# Patient Record
Sex: Female | Born: 1995 | Race: White | Hispanic: No | Marital: Married | Smoking: Never smoker
Health system: Southern US, Community
[De-identification: ages and names within clinical notes are randomized; demographics above are authoritative.]

## PROBLEM LIST (undated history)

## (undated) DIAGNOSIS — D649 Anemia, unspecified: Secondary | ICD-10-CM

## (undated) DIAGNOSIS — I10 Essential (primary) hypertension: Secondary | ICD-10-CM

## (undated) DIAGNOSIS — G43109 Migraine with aura, not intractable, without status migrainosus: Secondary | ICD-10-CM

## (undated) DIAGNOSIS — E0590 Thyrotoxicosis, unspecified without thyrotoxic crisis or storm: Secondary | ICD-10-CM

## (undated) DIAGNOSIS — R000 Tachycardia, unspecified: Secondary | ICD-10-CM

## (undated) HISTORY — PX: NO PAST SURGERIES: SHX2092

## (undated) HISTORY — DX: Tachycardia, unspecified: R00.0

## (undated) HISTORY — DX: Essential (primary) hypertension: I10

## (undated) HISTORY — DX: Anemia, unspecified: D64.9

## (undated) HISTORY — DX: Migraine with aura, not intractable, without status migrainosus: G43.109

---
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
## 2018-02-02 DIAGNOSIS — E0590 Thyrotoxicosis, unspecified without thyrotoxic crisis or storm: Secondary | ICD-10-CM | POA: Diagnosis not present

## 2018-02-05 DIAGNOSIS — I10 Essential (primary) hypertension: Secondary | ICD-10-CM | POA: Diagnosis not present

## 2018-02-05 DIAGNOSIS — Z6835 Body mass index (BMI) 35.0-35.9, adult: Secondary | ICD-10-CM | POA: Diagnosis not present

## 2018-02-05 DIAGNOSIS — G43919 Migraine, unspecified, intractable, without status migrainosus: Secondary | ICD-10-CM | POA: Diagnosis not present

## 2018-02-05 DIAGNOSIS — E0590 Thyrotoxicosis, unspecified without thyrotoxic crisis or storm: Secondary | ICD-10-CM | POA: Diagnosis not present

## 2018-04-05 DIAGNOSIS — Z79899 Other long term (current) drug therapy: Secondary | ICD-10-CM | POA: Diagnosis not present

## 2018-04-05 DIAGNOSIS — R51 Headache: Secondary | ICD-10-CM | POA: Diagnosis not present

## 2018-04-05 DIAGNOSIS — G43909 Migraine, unspecified, not intractable, without status migrainosus: Secondary | ICD-10-CM | POA: Diagnosis not present

## 2018-04-07 DIAGNOSIS — E0590 Thyrotoxicosis, unspecified without thyrotoxic crisis or storm: Secondary | ICD-10-CM | POA: Diagnosis not present

## 2018-06-17 DIAGNOSIS — E0590 Thyrotoxicosis, unspecified without thyrotoxic crisis or storm: Secondary | ICD-10-CM | POA: Diagnosis not present

## 2018-07-20 DIAGNOSIS — E669 Obesity, unspecified: Secondary | ICD-10-CM | POA: Diagnosis not present

## 2018-07-20 DIAGNOSIS — Z6835 Body mass index (BMI) 35.0-35.9, adult: Secondary | ICD-10-CM | POA: Diagnosis not present

## 2018-07-20 DIAGNOSIS — H6641 Suppurative otitis media, unspecified, right ear: Secondary | ICD-10-CM | POA: Diagnosis not present

## 2018-08-03 DIAGNOSIS — J0100 Acute maxillary sinusitis, unspecified: Secondary | ICD-10-CM | POA: Diagnosis not present

## 2018-08-03 DIAGNOSIS — Z6834 Body mass index (BMI) 34.0-34.9, adult: Secondary | ICD-10-CM | POA: Diagnosis not present

## 2018-08-20 DIAGNOSIS — G43919 Migraine, unspecified, intractable, without status migrainosus: Secondary | ICD-10-CM | POA: Diagnosis not present

## 2018-08-20 DIAGNOSIS — E0590 Thyrotoxicosis, unspecified without thyrotoxic crisis or storm: Secondary | ICD-10-CM | POA: Diagnosis not present

## 2018-08-20 DIAGNOSIS — Z6834 Body mass index (BMI) 34.0-34.9, adult: Secondary | ICD-10-CM | POA: Diagnosis not present

## 2018-11-30 DIAGNOSIS — F339 Major depressive disorder, recurrent, unspecified: Secondary | ICD-10-CM | POA: Diagnosis not present

## 2018-11-30 DIAGNOSIS — M542 Cervicalgia: Secondary | ICD-10-CM | POA: Diagnosis not present

## 2019-02-01 DIAGNOSIS — Z20828 Contact with and (suspected) exposure to other viral communicable diseases: Secondary | ICD-10-CM | POA: Diagnosis not present

## 2019-02-09 DIAGNOSIS — M79641 Pain in right hand: Secondary | ICD-10-CM | POA: Diagnosis not present

## 2019-02-09 DIAGNOSIS — M79643 Pain in unspecified hand: Secondary | ICD-10-CM | POA: Diagnosis not present

## 2019-02-09 DIAGNOSIS — S6991XA Unspecified injury of right wrist, hand and finger(s), initial encounter: Secondary | ICD-10-CM | POA: Diagnosis not present

## 2019-02-09 DIAGNOSIS — M25539 Pain in unspecified wrist: Secondary | ICD-10-CM | POA: Diagnosis not present

## 2019-02-09 DIAGNOSIS — M25531 Pain in right wrist: Secondary | ICD-10-CM | POA: Diagnosis not present

## 2019-02-10 DIAGNOSIS — X58XXXA Exposure to other specified factors, initial encounter: Secondary | ICD-10-CM | POA: Diagnosis not present

## 2019-02-10 DIAGNOSIS — S6991XA Unspecified injury of right wrist, hand and finger(s), initial encounter: Secondary | ICD-10-CM | POA: Diagnosis not present

## 2019-02-10 DIAGNOSIS — M25531 Pain in right wrist: Secondary | ICD-10-CM | POA: Diagnosis not present

## 2019-03-04 DIAGNOSIS — R5383 Other fatigue: Secondary | ICD-10-CM | POA: Diagnosis not present

## 2019-03-04 DIAGNOSIS — F5101 Primary insomnia: Secondary | ICD-10-CM | POA: Diagnosis not present

## 2019-03-04 DIAGNOSIS — E0590 Thyrotoxicosis, unspecified without thyrotoxic crisis or storm: Secondary | ICD-10-CM | POA: Diagnosis not present

## 2019-04-13 DIAGNOSIS — Z6834 Body mass index (BMI) 34.0-34.9, adult: Secondary | ICD-10-CM | POA: Diagnosis not present

## 2019-04-13 DIAGNOSIS — E0590 Thyrotoxicosis, unspecified without thyrotoxic crisis or storm: Secondary | ICD-10-CM | POA: Diagnosis not present

## 2019-04-13 DIAGNOSIS — G43919 Migraine, unspecified, intractable, without status migrainosus: Secondary | ICD-10-CM | POA: Diagnosis not present

## 2019-04-28 DIAGNOSIS — Z23 Encounter for immunization: Secondary | ICD-10-CM | POA: Diagnosis not present

## 2019-05-12 DIAGNOSIS — Z20828 Contact with and (suspected) exposure to other viral communicable diseases: Secondary | ICD-10-CM | POA: Diagnosis not present

## 2019-07-06 DIAGNOSIS — Z20828 Contact with and (suspected) exposure to other viral communicable diseases: Secondary | ICD-10-CM | POA: Diagnosis not present

## 2019-07-19 DIAGNOSIS — Z20828 Contact with and (suspected) exposure to other viral communicable diseases: Secondary | ICD-10-CM | POA: Diagnosis not present

## 2019-08-11 DIAGNOSIS — Z20822 Contact with and (suspected) exposure to covid-19: Secondary | ICD-10-CM | POA: Diagnosis not present

## 2019-09-20 DIAGNOSIS — G43919 Migraine, unspecified, intractable, without status migrainosus: Secondary | ICD-10-CM | POA: Diagnosis not present

## 2019-11-01 DIAGNOSIS — E860 Dehydration: Secondary | ICD-10-CM | POA: Diagnosis not present

## 2019-11-01 DIAGNOSIS — B373 Candidiasis of vulva and vagina: Secondary | ICD-10-CM | POA: Diagnosis not present

## 2019-11-01 DIAGNOSIS — N390 Urinary tract infection, site not specified: Secondary | ICD-10-CM | POA: Diagnosis not present

## 2019-11-02 DIAGNOSIS — R339 Retention of urine, unspecified: Secondary | ICD-10-CM | POA: Diagnosis not present

## 2019-11-02 DIAGNOSIS — E860 Dehydration: Secondary | ICD-10-CM | POA: Diagnosis not present

## 2019-11-02 DIAGNOSIS — N9089 Other specified noninflammatory disorders of vulva and perineum: Secondary | ICD-10-CM | POA: Diagnosis not present

## 2019-11-02 DIAGNOSIS — N390 Urinary tract infection, site not specified: Secondary | ICD-10-CM | POA: Diagnosis not present

## 2019-11-04 DIAGNOSIS — N390 Urinary tract infection, site not specified: Secondary | ICD-10-CM | POA: Diagnosis not present

## 2019-11-23 DIAGNOSIS — J309 Allergic rhinitis, unspecified: Secondary | ICD-10-CM | POA: Diagnosis not present

## 2019-11-24 DIAGNOSIS — J309 Allergic rhinitis, unspecified: Secondary | ICD-10-CM | POA: Diagnosis not present

## 2019-11-25 DIAGNOSIS — J309 Allergic rhinitis, unspecified: Secondary | ICD-10-CM | POA: Diagnosis not present

## 2019-11-26 DIAGNOSIS — J309 Allergic rhinitis, unspecified: Secondary | ICD-10-CM | POA: Diagnosis not present

## 2019-11-29 DIAGNOSIS — J309 Allergic rhinitis, unspecified: Secondary | ICD-10-CM | POA: Diagnosis not present

## 2019-11-30 DIAGNOSIS — J309 Allergic rhinitis, unspecified: Secondary | ICD-10-CM | POA: Diagnosis not present

## 2019-12-01 DIAGNOSIS — J309 Allergic rhinitis, unspecified: Secondary | ICD-10-CM | POA: Diagnosis not present

## 2019-12-02 DIAGNOSIS — J309 Allergic rhinitis, unspecified: Secondary | ICD-10-CM | POA: Diagnosis not present

## 2019-12-10 DIAGNOSIS — Z124 Encounter for screening for malignant neoplasm of cervix: Secondary | ICD-10-CM | POA: Diagnosis not present

## 2019-12-10 DIAGNOSIS — Z01419 Encounter for gynecological examination (general) (routine) without abnormal findings: Secondary | ICD-10-CM | POA: Diagnosis not present

## 2019-12-10 DIAGNOSIS — Z113 Encounter for screening for infections with a predominantly sexual mode of transmission: Secondary | ICD-10-CM | POA: Diagnosis not present

## 2019-12-10 DIAGNOSIS — Z6836 Body mass index (BMI) 36.0-36.9, adult: Secondary | ICD-10-CM | POA: Diagnosis not present

## 2019-12-10 LAB — CYTOLOGY - PAP: Pap: NEGATIVE

## 2020-02-22 DIAGNOSIS — Z9109 Other allergy status, other than to drugs and biological substances: Secondary | ICD-10-CM | POA: Diagnosis not present

## 2020-02-25 DIAGNOSIS — Z9109 Other allergy status, other than to drugs and biological substances: Secondary | ICD-10-CM | POA: Diagnosis not present

## 2020-02-29 DIAGNOSIS — Z9109 Other allergy status, other than to drugs and biological substances: Secondary | ICD-10-CM | POA: Diagnosis not present

## 2020-03-03 DIAGNOSIS — Z9109 Other allergy status, other than to drugs and biological substances: Secondary | ICD-10-CM | POA: Diagnosis not present

## 2020-03-07 DIAGNOSIS — Z9109 Other allergy status, other than to drugs and biological substances: Secondary | ICD-10-CM | POA: Diagnosis not present

## 2020-03-10 DIAGNOSIS — Z9109 Other allergy status, other than to drugs and biological substances: Secondary | ICD-10-CM | POA: Diagnosis not present

## 2020-03-14 DIAGNOSIS — Z9109 Other allergy status, other than to drugs and biological substances: Secondary | ICD-10-CM | POA: Diagnosis not present

## 2020-03-17 DIAGNOSIS — Z9109 Other allergy status, other than to drugs and biological substances: Secondary | ICD-10-CM | POA: Diagnosis not present

## 2020-03-22 DIAGNOSIS — Z9109 Other allergy status, other than to drugs and biological substances: Secondary | ICD-10-CM | POA: Diagnosis not present

## 2020-03-25 DIAGNOSIS — S50311A Abrasion of right elbow, initial encounter: Secondary | ICD-10-CM | POA: Diagnosis not present

## 2020-03-25 DIAGNOSIS — Y92410 Unspecified street and highway as the place of occurrence of the external cause: Secondary | ICD-10-CM | POA: Diagnosis not present

## 2020-03-25 DIAGNOSIS — R000 Tachycardia, unspecified: Secondary | ICD-10-CM | POA: Diagnosis not present

## 2020-03-25 DIAGNOSIS — S70351A Superficial foreign body, right thigh, initial encounter: Secondary | ICD-10-CM | POA: Diagnosis not present

## 2020-03-25 DIAGNOSIS — R0789 Other chest pain: Secondary | ICD-10-CM | POA: Diagnosis not present

## 2020-03-25 DIAGNOSIS — Z23 Encounter for immunization: Secondary | ICD-10-CM | POA: Diagnosis not present

## 2020-03-25 DIAGNOSIS — R58 Hemorrhage, not elsewhere classified: Secondary | ICD-10-CM | POA: Diagnosis not present

## 2020-03-25 DIAGNOSIS — S80211A Abrasion, right knee, initial encounter: Secondary | ICD-10-CM | POA: Diagnosis not present

## 2020-03-25 DIAGNOSIS — R52 Pain, unspecified: Secondary | ICD-10-CM | POA: Diagnosis not present

## 2020-03-28 DIAGNOSIS — Z6836 Body mass index (BMI) 36.0-36.9, adult: Secondary | ICD-10-CM | POA: Diagnosis not present

## 2020-03-28 DIAGNOSIS — M542 Cervicalgia: Secondary | ICD-10-CM | POA: Diagnosis not present

## 2020-04-07 DIAGNOSIS — Z9109 Other allergy status, other than to drugs and biological substances: Secondary | ICD-10-CM | POA: Diagnosis not present

## 2020-04-11 DIAGNOSIS — Z9109 Other allergy status, other than to drugs and biological substances: Secondary | ICD-10-CM | POA: Diagnosis not present

## 2020-04-12 DIAGNOSIS — Z23 Encounter for immunization: Secondary | ICD-10-CM | POA: Diagnosis not present

## 2020-04-18 DIAGNOSIS — Z9109 Other allergy status, other than to drugs and biological substances: Secondary | ICD-10-CM | POA: Diagnosis not present

## 2020-04-25 DIAGNOSIS — Z9109 Other allergy status, other than to drugs and biological substances: Secondary | ICD-10-CM | POA: Diagnosis not present

## 2020-05-02 DIAGNOSIS — Z9109 Other allergy status, other than to drugs and biological substances: Secondary | ICD-10-CM | POA: Diagnosis not present

## 2020-05-09 DIAGNOSIS — Z9109 Other allergy status, other than to drugs and biological substances: Secondary | ICD-10-CM | POA: Diagnosis not present

## 2020-05-11 DIAGNOSIS — G43919 Migraine, unspecified, intractable, without status migrainosus: Secondary | ICD-10-CM | POA: Diagnosis not present

## 2020-05-11 DIAGNOSIS — E0590 Thyrotoxicosis, unspecified without thyrotoxic crisis or storm: Secondary | ICD-10-CM | POA: Diagnosis not present

## 2020-05-11 DIAGNOSIS — F339 Major depressive disorder, recurrent, unspecified: Secondary | ICD-10-CM | POA: Diagnosis not present

## 2020-05-16 DIAGNOSIS — E0590 Thyrotoxicosis, unspecified without thyrotoxic crisis or storm: Secondary | ICD-10-CM | POA: Diagnosis not present

## 2020-05-16 DIAGNOSIS — Z9109 Other allergy status, other than to drugs and biological substances: Secondary | ICD-10-CM | POA: Diagnosis not present

## 2020-05-23 DIAGNOSIS — Z9109 Other allergy status, other than to drugs and biological substances: Secondary | ICD-10-CM | POA: Diagnosis not present

## 2020-05-30 DIAGNOSIS — Z9109 Other allergy status, other than to drugs and biological substances: Secondary | ICD-10-CM | POA: Diagnosis not present

## 2020-06-06 DIAGNOSIS — Z9109 Other allergy status, other than to drugs and biological substances: Secondary | ICD-10-CM | POA: Diagnosis not present

## 2020-06-07 DIAGNOSIS — Z23 Encounter for immunization: Secondary | ICD-10-CM | POA: Diagnosis not present

## 2020-06-27 DIAGNOSIS — Z9109 Other allergy status, other than to drugs and biological substances: Secondary | ICD-10-CM | POA: Diagnosis not present

## 2020-06-29 DIAGNOSIS — E0590 Thyrotoxicosis, unspecified without thyrotoxic crisis or storm: Secondary | ICD-10-CM | POA: Diagnosis not present

## 2020-07-04 DIAGNOSIS — Z9109 Other allergy status, other than to drugs and biological substances: Secondary | ICD-10-CM | POA: Diagnosis not present

## 2020-07-11 DIAGNOSIS — Z9109 Other allergy status, other than to drugs and biological substances: Secondary | ICD-10-CM | POA: Diagnosis not present

## 2020-07-18 DIAGNOSIS — Z9109 Other allergy status, other than to drugs and biological substances: Secondary | ICD-10-CM | POA: Diagnosis not present

## 2020-08-15 DIAGNOSIS — Z9109 Other allergy status, other than to drugs and biological substances: Secondary | ICD-10-CM | POA: Diagnosis not present

## 2020-08-22 DIAGNOSIS — Z9109 Other allergy status, other than to drugs and biological substances: Secondary | ICD-10-CM | POA: Diagnosis not present

## 2020-08-23 DIAGNOSIS — J019 Acute sinusitis, unspecified: Secondary | ICD-10-CM | POA: Diagnosis not present

## 2020-08-29 DIAGNOSIS — J329 Chronic sinusitis, unspecified: Secondary | ICD-10-CM | POA: Diagnosis not present

## 2020-08-29 DIAGNOSIS — Z6836 Body mass index (BMI) 36.0-36.9, adult: Secondary | ICD-10-CM | POA: Diagnosis not present

## 2020-08-29 DIAGNOSIS — Z9109 Other allergy status, other than to drugs and biological substances: Secondary | ICD-10-CM | POA: Diagnosis not present

## 2020-09-12 DIAGNOSIS — Z9109 Other allergy status, other than to drugs and biological substances: Secondary | ICD-10-CM | POA: Diagnosis not present

## 2020-09-20 DIAGNOSIS — Z9109 Other allergy status, other than to drugs and biological substances: Secondary | ICD-10-CM | POA: Diagnosis not present

## 2020-09-22 DIAGNOSIS — E0590 Thyrotoxicosis, unspecified without thyrotoxic crisis or storm: Secondary | ICD-10-CM | POA: Diagnosis not present

## 2020-10-05 DIAGNOSIS — Z9109 Other allergy status, other than to drugs and biological substances: Secondary | ICD-10-CM | POA: Diagnosis not present

## 2020-10-19 DIAGNOSIS — Z9109 Other allergy status, other than to drugs and biological substances: Secondary | ICD-10-CM | POA: Diagnosis not present

## 2020-10-26 DIAGNOSIS — Z9109 Other allergy status, other than to drugs and biological substances: Secondary | ICD-10-CM | POA: Diagnosis not present

## 2020-11-02 DIAGNOSIS — Z9109 Other allergy status, other than to drugs and biological substances: Secondary | ICD-10-CM | POA: Diagnosis not present

## 2020-11-09 DIAGNOSIS — E0590 Thyrotoxicosis, unspecified without thyrotoxic crisis or storm: Secondary | ICD-10-CM | POA: Diagnosis not present

## 2020-11-09 DIAGNOSIS — Z9109 Other allergy status, other than to drugs and biological substances: Secondary | ICD-10-CM | POA: Diagnosis not present

## 2020-11-16 DIAGNOSIS — Z9109 Other allergy status, other than to drugs and biological substances: Secondary | ICD-10-CM | POA: Diagnosis not present

## 2020-12-12 DIAGNOSIS — Z9109 Other allergy status, other than to drugs and biological substances: Secondary | ICD-10-CM | POA: Diagnosis not present

## 2020-12-14 DIAGNOSIS — Z20822 Contact with and (suspected) exposure to covid-19: Secondary | ICD-10-CM | POA: Diagnosis not present

## 2020-12-14 DIAGNOSIS — Z1152 Encounter for screening for COVID-19: Secondary | ICD-10-CM | POA: Diagnosis not present

## 2020-12-14 DIAGNOSIS — J029 Acute pharyngitis, unspecified: Secondary | ICD-10-CM | POA: Diagnosis not present

## 2020-12-25 DIAGNOSIS — E0500 Thyrotoxicosis with diffuse goiter without thyrotoxic crisis or storm: Secondary | ICD-10-CM | POA: Diagnosis not present

## 2020-12-25 DIAGNOSIS — G43809 Other migraine, not intractable, without status migrainosus: Secondary | ICD-10-CM | POA: Diagnosis not present

## 2020-12-25 DIAGNOSIS — E0590 Thyrotoxicosis, unspecified without thyrotoxic crisis or storm: Secondary | ICD-10-CM | POA: Diagnosis not present

## 2020-12-25 DIAGNOSIS — F419 Anxiety disorder, unspecified: Secondary | ICD-10-CM | POA: Diagnosis not present

## 2020-12-26 DIAGNOSIS — Z9109 Other allergy status, other than to drugs and biological substances: Secondary | ICD-10-CM | POA: Diagnosis not present

## 2021-01-01 DIAGNOSIS — Z20822 Contact with and (suspected) exposure to covid-19: Secondary | ICD-10-CM | POA: Diagnosis not present

## 2021-01-01 DIAGNOSIS — E0590 Thyrotoxicosis, unspecified without thyrotoxic crisis or storm: Secondary | ICD-10-CM | POA: Diagnosis not present

## 2021-01-01 DIAGNOSIS — R0602 Shortness of breath: Secondary | ICD-10-CM | POA: Diagnosis not present

## 2021-01-01 DIAGNOSIS — N920 Excessive and frequent menstruation with regular cycle: Secondary | ICD-10-CM | POA: Diagnosis not present

## 2021-01-02 DIAGNOSIS — Z9109 Other allergy status, other than to drugs and biological substances: Secondary | ICD-10-CM | POA: Diagnosis not present

## 2021-01-02 DIAGNOSIS — N920 Excessive and frequent menstruation with regular cycle: Secondary | ICD-10-CM | POA: Diagnosis not present

## 2021-01-09 DIAGNOSIS — Z9109 Other allergy status, other than to drugs and biological substances: Secondary | ICD-10-CM | POA: Diagnosis not present

## 2021-02-01 DIAGNOSIS — Z9109 Other allergy status, other than to drugs and biological substances: Secondary | ICD-10-CM | POA: Diagnosis not present

## 2021-02-21 DIAGNOSIS — R059 Cough, unspecified: Secondary | ICD-10-CM | POA: Diagnosis not present

## 2021-02-21 DIAGNOSIS — Z20822 Contact with and (suspected) exposure to covid-19: Secondary | ICD-10-CM | POA: Diagnosis not present

## 2021-02-21 DIAGNOSIS — Z1152 Encounter for screening for COVID-19: Secondary | ICD-10-CM | POA: Diagnosis not present

## 2021-02-23 DIAGNOSIS — Z20822 Contact with and (suspected) exposure to covid-19: Secondary | ICD-10-CM | POA: Diagnosis not present

## 2021-02-23 DIAGNOSIS — J029 Acute pharyngitis, unspecified: Secondary | ICD-10-CM | POA: Diagnosis not present

## 2021-03-29 DIAGNOSIS — N390 Urinary tract infection, site not specified: Secondary | ICD-10-CM | POA: Diagnosis not present

## 2021-03-29 DIAGNOSIS — Z6836 Body mass index (BMI) 36.0-36.9, adult: Secondary | ICD-10-CM | POA: Diagnosis not present

## 2021-03-29 DIAGNOSIS — R300 Dysuria: Secondary | ICD-10-CM | POA: Diagnosis not present

## 2021-03-29 DIAGNOSIS — R82998 Other abnormal findings in urine: Secondary | ICD-10-CM | POA: Diagnosis not present

## 2021-04-04 DIAGNOSIS — Z23 Encounter for immunization: Secondary | ICD-10-CM | POA: Diagnosis not present

## 2021-04-25 DIAGNOSIS — Z23 Encounter for immunization: Secondary | ICD-10-CM | POA: Diagnosis not present

## 2021-06-05 DIAGNOSIS — Z20822 Contact with and (suspected) exposure to covid-19: Secondary | ICD-10-CM | POA: Diagnosis not present

## 2021-06-05 DIAGNOSIS — R509 Fever, unspecified: Secondary | ICD-10-CM | POA: Diagnosis not present

## 2021-06-05 DIAGNOSIS — Z6836 Body mass index (BMI) 36.0-36.9, adult: Secondary | ICD-10-CM | POA: Diagnosis not present

## 2021-06-05 DIAGNOSIS — J069 Acute upper respiratory infection, unspecified: Secondary | ICD-10-CM | POA: Diagnosis not present

## 2021-06-29 DIAGNOSIS — O219 Vomiting of pregnancy, unspecified: Secondary | ICD-10-CM | POA: Diagnosis not present

## 2021-06-29 DIAGNOSIS — O468X1 Other antepartum hemorrhage, first trimester: Secondary | ICD-10-CM | POA: Diagnosis not present

## 2021-06-29 DIAGNOSIS — R1110 Vomiting, unspecified: Secondary | ICD-10-CM | POA: Diagnosis not present

## 2021-06-29 DIAGNOSIS — Z3A01 Less than 8 weeks gestation of pregnancy: Secondary | ICD-10-CM | POA: Diagnosis not present

## 2021-06-29 DIAGNOSIS — O210 Mild hyperemesis gravidarum: Secondary | ICD-10-CM | POA: Diagnosis not present

## 2021-06-29 DIAGNOSIS — O3481 Maternal care for other abnormalities of pelvic organs, first trimester: Secondary | ICD-10-CM | POA: Diagnosis not present

## 2021-06-29 DIAGNOSIS — Z3A08 8 weeks gestation of pregnancy: Secondary | ICD-10-CM | POA: Diagnosis not present

## 2021-06-30 DIAGNOSIS — O3481 Maternal care for other abnormalities of pelvic organs, first trimester: Secondary | ICD-10-CM | POA: Diagnosis not present

## 2021-06-30 DIAGNOSIS — Z3A01 Less than 8 weeks gestation of pregnancy: Secondary | ICD-10-CM | POA: Diagnosis not present

## 2021-06-30 DIAGNOSIS — O468X1 Other antepartum hemorrhage, first trimester: Secondary | ICD-10-CM | POA: Diagnosis not present

## 2021-07-05 DIAGNOSIS — J029 Acute pharyngitis, unspecified: Secondary | ICD-10-CM | POA: Diagnosis not present

## 2021-07-05 DIAGNOSIS — Z20822 Contact with and (suspected) exposure to covid-19: Secondary | ICD-10-CM | POA: Diagnosis not present

## 2021-07-05 DIAGNOSIS — R0982 Postnasal drip: Secondary | ICD-10-CM | POA: Diagnosis not present

## 2021-07-05 DIAGNOSIS — Z1152 Encounter for screening for COVID-19: Secondary | ICD-10-CM | POA: Diagnosis not present

## 2021-07-06 DIAGNOSIS — R000 Tachycardia, unspecified: Secondary | ICD-10-CM | POA: Diagnosis not present

## 2021-07-06 DIAGNOSIS — O99281 Endocrine, nutritional and metabolic diseases complicating pregnancy, first trimester: Secondary | ICD-10-CM | POA: Diagnosis not present

## 2021-07-06 DIAGNOSIS — E0590 Thyrotoxicosis, unspecified without thyrotoxic crisis or storm: Secondary | ICD-10-CM | POA: Diagnosis not present

## 2021-07-08 DIAGNOSIS — O210 Mild hyperemesis gravidarum: Secondary | ICD-10-CM | POA: Diagnosis not present

## 2021-07-08 DIAGNOSIS — Z3A09 9 weeks gestation of pregnancy: Secondary | ICD-10-CM | POA: Diagnosis not present

## 2021-07-09 DIAGNOSIS — R000 Tachycardia, unspecified: Secondary | ICD-10-CM | POA: Diagnosis not present

## 2021-07-11 DIAGNOSIS — N925 Other specified irregular menstruation: Secondary | ICD-10-CM | POA: Diagnosis not present

## 2021-07-11 DIAGNOSIS — Z6833 Body mass index (BMI) 33.0-33.9, adult: Secondary | ICD-10-CM | POA: Diagnosis not present

## 2021-07-11 DIAGNOSIS — Z113 Encounter for screening for infections with a predominantly sexual mode of transmission: Secondary | ICD-10-CM | POA: Diagnosis not present

## 2021-07-11 DIAGNOSIS — E876 Hypokalemia: Secondary | ICD-10-CM | POA: Diagnosis not present

## 2021-07-11 DIAGNOSIS — Z3480 Encounter for supervision of other normal pregnancy, unspecified trimester: Secondary | ICD-10-CM | POA: Diagnosis not present

## 2021-07-20 ENCOUNTER — Inpatient Hospital Stay (HOSPITAL_COMMUNITY)
Admission: AD | Admit: 2021-07-20 | Discharge: 2021-07-20 | Disposition: A | Discharge status: Home / Self Care | Payer: BC Managed Care – PPO | Attending: Obstetrics and Gynecology | Admitting: Obstetrics and Gynecology

## 2021-07-20 ENCOUNTER — Other Ambulatory Visit: Payer: Self-pay

## 2021-07-20 ENCOUNTER — Encounter (HOSPITAL_COMMUNITY): Payer: Self-pay | Admitting: Obstetrics and Gynecology

## 2021-07-20 DIAGNOSIS — R1110 Vomiting, unspecified: Secondary | ICD-10-CM | POA: Diagnosis not present

## 2021-07-20 DIAGNOSIS — Z3A11 11 weeks gestation of pregnancy: Secondary | ICD-10-CM | POA: Diagnosis not present

## 2021-07-20 DIAGNOSIS — O219 Vomiting of pregnancy, unspecified: Secondary | ICD-10-CM | POA: Diagnosis not present

## 2021-07-20 HISTORY — DX: Thyrotoxicosis, unspecified without thyrotoxic crisis or storm: E05.90

## 2021-07-20 LAB — COMPREHENSIVE METABOLIC PANEL
ALT: 42 U/L (ref 0–44)
AST: 25 U/L (ref 15–41)
Albumin: 3.6 g/dL (ref 3.5–5.0)
Alkaline Phosphatase: 72 U/L (ref 38–126)
Anion gap: 12 (ref 5–15)
BUN: 6 mg/dL (ref 6–20)
CO2: 20 mmol/L — ABNORMAL LOW (ref 22–32)
Calcium: 9.3 mg/dL (ref 8.9–10.3)
Chloride: 104 mmol/L (ref 98–111)
Creatinine, Ser: 0.44 mg/dL (ref 0.44–1.00)
GFR, Estimated: >60 mL/min (ref >60)
Glucose, Bld: 97 mg/dL (ref 70–99)
Potassium: 3.8 mmol/L (ref 3.5–5.1)
Sodium: 136 mmol/L (ref 135–145)
Total Bilirubin: 0.7 mg/dL (ref 0.3–1.2)
Total Protein: 6.7 g/dL (ref 6.5–8.1)

## 2021-07-20 LAB — CBC
HCT: 38.3 % (ref 36.0–46.0)
Hemoglobin: 13.1 g/dL (ref 12.0–15.0)
MCH: 27.8 pg (ref 26.0–34.0)
MCHC: 34.2 g/dL (ref 30.0–36.0)
MCV: 81.1 fL (ref 80.0–100.0)
Platelets: 189 10*3/uL (ref 150–400)
RBC: 4.72 MIL/uL (ref 3.87–5.11)
RDW: 12 % (ref 11.5–15.5)
WBC: 5.5 10*3/uL (ref 4.0–10.5)
nRBC: 0 % (ref 0.0–0.2)

## 2021-07-20 LAB — URINALYSIS, ROUTINE W REFLEX MICROSCOPIC
Bacteria, UA: NONE SEEN
Bilirubin Urine: NEGATIVE
Glucose, UA: NEGATIVE mg/dL
Hgb urine dipstick: NEGATIVE
Ketones, ur: NEGATIVE mg/dL
Leukocytes,Ua: NEGATIVE
Nitrite: NEGATIVE
Protein, ur: 30 mg/dL — AB
Specific Gravity, Urine: 1.025 (ref 1.005–1.030)
pH: 8 (ref 5.0–8.0)

## 2021-07-20 MED ORDER — SCOPOLAMINE 1 MG/3DAYS TD PT72
1.0000 | MEDICATED_PATCH | TRANSDERMAL | Status: DC
  Administered 2021-07-20: 16:00:00 1.5 mg via TRANSDERMAL
  Filled 2021-07-20: qty 1

## 2021-07-20 MED ORDER — SCOPOLAMINE 1 MG/3DAYS TD PT72: 1.0000 | MEDICATED_PATCH | TRANSDERMAL | 12 refills | Status: DC

## 2021-07-20 MED ORDER — FAMOTIDINE 20 MG PO TABS: 20.0000 mg | ORAL_TABLET | Freq: Two times a day (BID) | ORAL | 0 refills | Status: DC

## 2021-07-20 MED ORDER — SODIUM CHLORIDE 0.9 % IV SOLN
25.0000 mg | Freq: Once | INTRAVENOUS | Status: AC
  Administered 2021-07-20: 16:00:00 25 mg via INTRAVENOUS
  Filled 2021-07-20: qty 1

## 2021-07-20 MED ORDER — FAMOTIDINE IN NACL 20-0.9 MG/50ML-% IV SOLN
20.0000 mg | Freq: Once | INTRAVENOUS | Status: AC
  Administered 2021-07-20: 16:00:00 20 mg via INTRAVENOUS
  Filled 2021-07-20: qty 50

## 2021-07-20 MED ORDER — PROMETHAZINE HCL 25 MG RE SUPP: 25.0000 mg | Freq: Four times a day (QID) | RECTAL | 0 refills | Status: DC | PRN

## 2021-07-20 MED ORDER — DEXTROSE IN LACTATED RINGERS 5 % IV SOLN
Freq: Once | INTRAVENOUS | Status: AC
  Filled 2021-07-20: qty 10

## 2021-07-20 NOTE — MAU Provider Note (Signed)
History     CSN: [id number]  Arrival date and time: 07/20/21 1419  None    Chief Complaint  Patient presents with   Emesis   Nausea   HPI April Benson is a 25 y.o. G1P0 at [redacted]w[redacted]d by early ultrasound who presents to MAU for nausea and vomiting. Patient reports ongoing n/v since 06/22/2021. She has been taking Zofran ODT, last took at 9am, which helps for about an hour before she starts vomiting again. She also has a prescription for Phenergan, however has not tried to take it recently because every time she takes it, it immediately comes back up. She reports she has vomited 7x in the past 24 hours. She reports that the only thing that she is able to tolerate is pink lemonade. Tried to eat a baked potato last night, but immediately vomited afterwards. She denies pain, bleeding, discharge, or fever. Receives [facility] at [facility] Has appointment on 07/26/2021.   OB History     Gravida  1   Para      Term      Preterm      AB      Living         SAB      IAB      Ectopic      Multiple      Live Births              Past Medical History:  Diagnosis Date   Hyperthyroidism     Past Surgical History:  Procedure Laterality Date   NO PAST SURGERIES      History reviewed. No pertinent family history.  Social History   Tobacco Use   Smoking status: Never   Smokeless tobacco: Never  Vaping Use   Vaping Use: Never used  Substance Use Topics   Alcohol use: Not Currently   Drug use: Never    Allergies: No Known Allergies  No medications prior to admission.    Review of Systems  Constitutional: Negative.   Respiratory: Negative.    Cardiovascular: Negative.   Gastrointestinal:  Positive for nausea and vomiting. Negative for abdominal pain.  Genitourinary: Negative.   Musculoskeletal: Negative.   Neurological: Negative.    Physical Exam   Blood pressure 129/83, pulse (!) 104, temperature 98.3 F (36.8 C), temperature source Oral, resp. rate 19,  height 5\' 3"  (1.6 m), weight 82.7 kg, SpO2 100 %.  Physical Exam Vitals and nursing note reviewed.  Constitutional:      General: She is not in acute distress.    Appearance: She is overweight. She is not ill-appearing.  Eyes:     Extraocular Movements: Extraocular movements intact.     Pupils: Pupils are equal, round, and reactive to light.  Cardiovascular:     Rate and Rhythm: Tachycardia present.  Pulmonary:     Effort: Pulmonary effort is normal.  Abdominal:     Palpations: Abdomen is soft.     Tenderness: There is no abdominal tenderness. There is no guarding or rebound.  Musculoskeletal:        General: Normal range of motion.     Cervical back: Normal range of motion.  Skin:    General: Skin is warm and dry.  Neurological:     General: No focal deficit present.     Mental Status: She is alert and oriented to person, place, and time.  Psychiatric:        Mood and Affect: Mood normal.  Behavior: Behavior normal.        Thought Content: Thought content normal.        Judgment: Judgment normal.   FHT: 166 bpm via doppler  MAU Course  Procedures UA CBC, CMP Phenergan IV infusion MVI Pepcid IV Scopolamine patch  MDM UA with 30 ketones otherwise unremarkable. CBC and CMP unremarkable. Phenergan IV infusion, pepcid IV, MVI infusion, scopolamine patch. Nausea improved. Tolerated PO fluids.   Assessment and Plan  [redacted] weeks gestation of pregnancy Nausea and vomiting affecting pregnancy  - Discharge home in stable condition - Rx for Phenergan suppositories, Pepcid, and scopolamine patch sent to pharmacy - Strict return precautions reviewed. Return to MAU as needed or for worsening symptoms - Keep OB appointment as scheduled on 07/26/2021   Brand Males, CNM 07/20/2021, 6:50 PM

## 2021-07-20 NOTE — MAU Note (Addendum)
...  April Benson [facility] is a 25 y.o. at Unknown here in MAU reporting: Called [facility] this morning due to being down 35 lbs. She states she has been experiencing nausea and vomiting since 12/2. She states she has been "bird picking" and unable to eat full meals. Has thrown up three times today. No VB, LOF, no abnormal discharge. Unable to take her hyperthyroid medication or her labetalol for HBP due to N/V. Has established care with [facility].  Last dose: Zofran this morning at 0900  Last ate: Yesterday evening - baked potato.  Denies pain.  FHT: 166 doppler Lab orders placed from triage:  UA

## 2021-07-22 NOTE — L&D Delivery Note (Signed)
OB/GYN Faculty Practice Delivery Note  April Benson is a 26 y.o. G1P1001 s/p SVD at [redacted]w[redacted]d. She was admitted for IOL due to uncontrolled hyperthyroidism.   ROM: 7h 75m with clear fluid GBS Status: Negative   Delivery Date/Time: 01/19/22 at 0636  Delivery: Called to room and patient was complete and pushing. Head delivered LOA. No nuchal cord present. Shoulders and body delivered in usual fashion. Infant with spontaneous cry, placed on mother's abdomen, dried and stimulated. Cord clamped x 2 after 1-minute delay and by FOB under direct supervision. Cord blood drawn. Placenta delivered spontaneously with gentle cord traction. Fundus firm with massage and Pitocin. Labia, perineum, vagina, and cervix were inspected, and patient was found to have bilateral labial lacerations, a left periurethral laceration, and a 1st degree perineal laceration. The 1st degree perineal laceration was repaired with 3-0 Vicryl and found to be hemostatic. The remaining lacerations were hemostatic, superficial, and not repaired.   Placenta: Intact, 3 VC - sent to L&D Complications: None  Lacerations: 1st degree perineal, bilateral labial, left periurethral  EBL: 95 cc Analgesia: Epidural   Infant: Viable female  APGARs 8 and 9  Evalina Field, MD OB/GYN Fellow, Faculty Practice

## 2021-07-26 DIAGNOSIS — Z369 Encounter for antenatal screening, unspecified: Secondary | ICD-10-CM | POA: Diagnosis not present

## 2021-07-26 DIAGNOSIS — Z3480 Encounter for supervision of other normal pregnancy, unspecified trimester: Secondary | ICD-10-CM | POA: Diagnosis not present

## 2021-07-26 DIAGNOSIS — O210 Mild hyperemesis gravidarum: Secondary | ICD-10-CM | POA: Diagnosis not present

## 2021-07-26 LAB — HEPATITIS C ANTIBODY: HCV Ab: NEGATIVE

## 2021-07-26 LAB — OB RESULTS CONSOLE HIV ANTIBODY (ROUTINE TESTING): HIV: NONREACTIVE

## 2021-07-26 LAB — OB RESULTS CONSOLE RPR: RPR: NONREACTIVE

## 2021-07-26 LAB — OB RESULTS CONSOLE RUBELLA ANTIBODY, IGM: Rubella: IMMUNE

## 2021-07-26 LAB — OB RESULTS CONSOLE ABO/RH: RH Type: POSITIVE

## 2021-07-26 LAB — OB RESULTS CONSOLE ANTIBODY SCREEN: Antibody Screen: NEGATIVE

## 2021-07-26 LAB — OB RESULTS CONSOLE HEPATITIS B SURFACE ANTIGEN: Hepatitis B Surface Ag: NEGATIVE

## 2021-07-29 DIAGNOSIS — B9689 Other specified bacterial agents as the cause of diseases classified elsewhere: Secondary | ICD-10-CM | POA: Diagnosis not present

## 2021-07-29 DIAGNOSIS — J0100 Acute maxillary sinusitis, unspecified: Secondary | ICD-10-CM | POA: Diagnosis not present

## 2021-07-29 DIAGNOSIS — Z20822 Contact with and (suspected) exposure to covid-19: Secondary | ICD-10-CM | POA: Diagnosis not present

## 2021-08-03 ENCOUNTER — Inpatient Hospital Stay (HOSPITAL_COMMUNITY)
Admission: AD | Admit: 2021-08-03 | Discharge: 2021-08-03 | Disposition: A | Discharge status: Home / Self Care | Payer: BC Managed Care – PPO | Attending: Obstetrics & Gynecology | Admitting: Obstetrics & Gynecology

## 2021-08-03 ENCOUNTER — Encounter (HOSPITAL_COMMUNITY): Payer: Self-pay | Admitting: Obstetrics & Gynecology

## 2021-08-03 ENCOUNTER — Inpatient Hospital Stay (HOSPITAL_COMMUNITY): Payer: BC Managed Care – PPO

## 2021-08-03 DIAGNOSIS — Z3A12 12 weeks gestation of pregnancy: Secondary | ICD-10-CM | POA: Diagnosis not present

## 2021-08-03 DIAGNOSIS — E0590 Thyrotoxicosis, unspecified without thyrotoxic crisis or storm: Secondary | ICD-10-CM

## 2021-08-03 DIAGNOSIS — O210 Mild hyperemesis gravidarum: Secondary | ICD-10-CM

## 2021-08-03 DIAGNOSIS — Z3A13 13 weeks gestation of pregnancy: Secondary | ICD-10-CM

## 2021-08-03 DIAGNOSIS — I10 Essential (primary) hypertension: Secondary | ICD-10-CM | POA: Diagnosis not present

## 2021-08-03 DIAGNOSIS — E0500 Thyrotoxicosis with diffuse goiter without thyrotoxic crisis or storm: Secondary | ICD-10-CM | POA: Diagnosis not present

## 2021-08-03 DIAGNOSIS — Z79899 Other long term (current) drug therapy: Secondary | ICD-10-CM | POA: Insufficient documentation

## 2021-08-03 DIAGNOSIS — R030 Elevated blood-pressure reading, without diagnosis of hypertension: Secondary | ICD-10-CM | POA: Diagnosis not present

## 2021-08-03 DIAGNOSIS — O99281 Endocrine, nutritional and metabolic diseases complicating pregnancy, first trimester: Secondary | ICD-10-CM | POA: Diagnosis not present

## 2021-08-03 DIAGNOSIS — O26891 Other specified pregnancy related conditions, first trimester: Secondary | ICD-10-CM | POA: Insufficient documentation

## 2021-08-03 DIAGNOSIS — R634 Abnormal weight loss: Secondary | ICD-10-CM | POA: Diagnosis not present

## 2021-08-03 LAB — COMPREHENSIVE METABOLIC PANEL
ALT: 44 U/L (ref 0–44)
AST: 32 U/L (ref 15–41)
Albumin: 3.1 g/dL — ABNORMAL LOW (ref 3.5–5.0)
Alkaline Phosphatase: 99 U/L (ref 38–126)
Anion gap: 11 (ref 5–15)
BUN: 5 mg/dL — ABNORMAL LOW (ref 6–20)
CO2: 22 mmol/L (ref 22–32)
Calcium: 9.5 mg/dL (ref 8.9–10.3)
Chloride: 106 mmol/L (ref 98–111)
Creatinine, Ser: 0.39 mg/dL — ABNORMAL LOW (ref 0.44–1.00)
GFR, Estimated: >60 mL/min (ref >60)
Glucose, Bld: 98 mg/dL (ref 70–99)
Potassium: 3.7 mmol/L (ref 3.5–5.1)
Sodium: 139 mmol/L (ref 135–145)
Total Bilirubin: 0.5 mg/dL (ref 0.3–1.2)
Total Protein: 7 g/dL (ref 6.5–8.1)

## 2021-08-03 LAB — URINALYSIS, ROUTINE W REFLEX MICROSCOPIC
Bilirubin Urine: NEGATIVE
Glucose, UA: NEGATIVE mg/dL
Hgb urine dipstick: NEGATIVE
Ketones, ur: NEGATIVE mg/dL
Nitrite: NEGATIVE
Protein, ur: 30 mg/dL — AB
Specific Gravity, Urine: 1.025 (ref 1.005–1.030)
pH: 6 (ref 5.0–8.0)

## 2021-08-03 LAB — CBC
HCT: 35.6 % — ABNORMAL LOW (ref 36.0–46.0)
Hemoglobin: 12.1 g/dL (ref 12.0–15.0)
MCH: 27.2 pg (ref 26.0–34.0)
MCHC: 34 g/dL (ref 30.0–36.0)
MCV: 80 fL (ref 80.0–100.0)
Platelets: 210 10*3/uL (ref 150–400)
RBC: 4.45 MIL/uL (ref 3.87–5.11)
RDW: 12 % (ref 11.5–15.5)
WBC: 6 10*3/uL (ref 4.0–10.5)
nRBC: 0 % (ref 0.0–0.2)

## 2021-08-03 LAB — TSH: TSH: <0.01 u[IU]/mL — ABNORMAL LOW (ref 0.350–4.500)

## 2021-08-03 LAB — T4, FREE: Free T4: 3.42 ng/dL — ABNORMAL HIGH (ref 0.61–1.12)

## 2021-08-03 MED ORDER — METOPROLOL SUCCINATE ER 25 MG PO TB24: 25.0000 mg | ORAL_TABLET | Freq: Every day | ORAL | 0 refills | Status: DC

## 2021-08-03 MED ORDER — M.V.I. ADULT IV INJ
Freq: Once | INTRAVENOUS | Status: AC
  Filled 2021-08-03: qty 1000

## 2021-08-03 MED ORDER — ONDANSETRON 8 MG PO TBDP: 8.0000 mg | ORAL_TABLET | Freq: Three times a day (TID) | ORAL | 0 refills | Status: DC | PRN

## 2021-08-03 MED ORDER — ONDANSETRON HCL 4 MG/2ML IJ SOLN
4.0000 mg | Freq: Once | INTRAMUSCULAR | Status: AC
  Administered 2021-08-03: 4 mg via INTRAVENOUS
  Filled 2021-08-03: qty 2

## 2021-08-03 MED ORDER — SCOPOLAMINE 1 MG/3DAYS TD PT72
1.0000 | MEDICATED_PATCH | Freq: Once | TRANSDERMAL | Status: DC
  Administered 2021-08-03: 1.5 mg via TRANSDERMAL
  Filled 2021-08-03: qty 1

## 2021-08-03 MED ORDER — METOPROLOL TARTRATE 5 MG/5ML IV SOLN
5.0000 mg | Freq: Once | INTRAVENOUS | Status: AC
  Administered 2021-08-03: 5 mg via INTRAVENOUS
  Filled 2021-08-03: qty 5

## 2021-08-03 MED ORDER — METHIMAZOLE 5 MG PO TABS
5.0000 mg | ORAL_TABLET | Freq: Once | ORAL | Status: AC
  Administered 2021-08-03: 5 mg via ORAL
  Filled 2021-08-03: qty 1

## 2021-08-03 MED ORDER — METHIMAZOLE 5 MG PO TABS: 5.0000 mg | ORAL_TABLET | Freq: Two times a day (BID) | ORAL | 0 refills | Status: DC

## 2021-08-03 MED ORDER — LACTATED RINGERS IV SOLN
25.0000 mg | Freq: Once | INTRAVENOUS | Status: AC
  Administered 2021-08-03: 25 mg via INTRAVENOUS
  Filled 2021-08-03: qty 1

## 2021-08-03 NOTE — MAU Provider Note (Addendum)
History     CSN: [id number]  Arrival date and time: 08/03/21 1506   Event Date/Time   First Provider Initiated Contact with Patient 08/03/21 1550      Chief Complaint  Patient presents with   Hypertension   Emesis   Ms. April Benson [facility] is a 26 y.o. year old G32P0 female at [redacted]w[redacted]d weeks gestation who presents to MAU reporting HTN and N/V since 06/22/2021. BP was 160/90, pulse rate 147 at PCP office today. She has a h/o Graves dz. She was taking Tapazole and her hyperthyroidism was regulated. She was taken off that and switched to PTU, but she is not able to keep it down. She reports she has lost "40 pounds" since 06/22/2021. She has been Rx'd Zofran ODT, Scopolamine and Phenergan. She reports that she is able to keep H2O and baked potato down on the 2nd day of using the scope patch. She complains of H/A, blurred vision and dizziness since starting the scope patch. She has episodes of worsening H/A this week, but she took Tylenol and was able to sleep. She reports that her PCP (who is also her employer) is concerned that she may be in a thyroid storm and she needs to be evaluated. She is a patient of Dr. Harrington Challenger at [place], but  has "fired them as of today." She reports she notified them by leaving a voicemail that no one has responded back to at this time. Her spouse is present and contributing to the history taking. She and her spouse both decline to be seen or have care consulted by [facility] provider.   OB History     Gravida  1   Para      Term      Preterm      AB      Living         SAB      IAB      Ectopic      Multiple      Live Births              Past Medical History:  Diagnosis Date   Hyperthyroidism     Past Surgical History:  Procedure Laterality Date   NO PAST SURGERIES      History reviewed. No pertinent family history.  Social History   Tobacco Use   Smoking status: Never   Smokeless tobacco: Never  Vaping Use   Vaping Use: Never used   Substance Use Topics   Alcohol use: Not Currently   Drug use: Never    Allergies: No Known Allergies  Medications Prior to Admission  Medication Sig Dispense Refill Last Dose   labetalol (NORMODYNE) 100 MG tablet Take 50 mg by mouth 2 (two) times daily.      propylthiouracil (PTU) 50 MG tablet Take 50 mg by mouth 2 (two) times daily.      famotidine (PEPCID) 20 MG tablet Take 1 tablet (20 mg total) by mouth 2 (two) times daily. 30 tablet 0    promethazine (PHENERGAN) 25 MG suppository Place 1 suppository (25 mg total) rectally every 6 (six) hours as needed for nausea or vomiting. 12 each 0    scopolamine (TRANSDERM-SCOP) 1 MG/3DAYS Place 1 patch (1.5 mg total) onto the skin every 3 (three) days. 10 patch 12     Review of Systems  Constitutional:  Positive for appetite change and unexpected weight change ("lost 40 lbs since 06/22/21").  HENT: Negative.    Eyes:  Positive for visual disturbance.  Cardiovascular:        Elevated BP in PCP office today  Gastrointestinal:  Positive for nausea. Vomiting: unable to keep any food or mauch H2O down; unable to take medication. Endocrine: Negative.   Genitourinary: Negative.   Musculoskeletal: Negative.   Skin: Negative.   Allergic/Immunologic: Negative.   Neurological:  Positive for dizziness. Negative for headaches (none now).  Hematological: Negative.   Psychiatric/Behavioral: Negative.    Physical Exam   Patient Vitals for the past 24 hrs:  BP Temp Temp src Pulse Resp SpO2 Height Weight  08/03/21 1911 116/63 -- -- 100 18 100 % -- --  08/03/21 1850 -- -- -- (!) 107 -- -- -- --  08/03/21 1830 -- -- -- 92 -- 99 % -- --  08/03/21 1825 -- -- -- -- -- 99 % -- --  08/03/21 1755 (!) 129/99 -- -- (!) 121 -- 99 % -- --  08/03/21 1520 138/81 98.7 F (37.1 C) Oral (!) 123 16 100 % 5\' 3"  (1.6 m) 81 kg  08/03/21 1519 138/81 -- -- (!) 130 -- -- -- --    Physical Exam Constitutional:      Appearance: Normal appearance. She is normal weight.   Neck:     Thyroid: Thyromegaly present.     Comments: Enlarged thyroid Cardiovascular:     Rate and Rhythm: Tachycardia present.  Pulmonary:     Effort: Pulmonary effort is normal.  Abdominal:     Palpations: Abdomen is soft.  Genitourinary:    Comments: deferred Musculoskeletal:        General: Normal range of motion.     Cervical back: Normal range of motion.  Skin:    General: Skin is warm and dry.  Neurological:     Mental Status: She is alert and oriented to person, place, and time.  Psychiatric:        Mood and Affect: Mood normal.        Behavior: Behavior normal.        Thought Content: Thought content normal.        Judgment: Judgment normal.   *Reassessment @ 1800: Phenergan bolus almost complete and patient "not as nausea, but still feeling not right." *Reassessment @ 1900: Able to keep po medication down  MAU Course  Procedures  MDM CCUA CBC CMP TSH Free T4 T3 OB Complete <14 wks  *Consult with Dr. Nehemiah Settle @ 1743 - notified of patient's complaints, assessments, lab & U/S results, recommended tx plan give Metroprolol 5 mg IV once, then give Tapozole 5 mg po-> if able to tolerate, can be ok to d/c home. If not able to keep down Tapozole, needs to be admitted.   *Consult with Dr. Ilda Basset @ 1903 - notified of patient's complaints, assessments, lab & U/S results, recommended tx plan get records from [facility], Rx Toprol XL 25 mg po qd x 2 weeks, Tapozole 5 mg po BID (next dose tomorrow morning), continue anti-emetics, F/U with either [facility], [facility] or [facility] as preferred by patient next week - ok to d/c home   *TC to Dr. Willis Modena @ 1910 notified of patient and prenatal records faxed to MAU -- records received   Results for orders placed or performed during the hospital encounter of 08/03/21 (from the past 24 hour(s))  TSH     Status: Abnormal   Collection Time: 08/03/21  3:45 PM  Result Value Ref Range   TSH <0.010 (L) 0.350 - 4.500 uIU/mL  Comprehensive metabolic  panel     Status: Abnormal   Collection Time: 08/03/21  3:45 PM  Result Value Ref Range   Sodium 139 135 - 145 mmol/L   Potassium 3.7 3.5 - 5.1 mmol/L   Chloride 106 98 - 111 mmol/L   CO2 22 22 - 32 mmol/L   Glucose, Bld 98 70 - 99 mg/dL   BUN 5 (L) 6 - 20 mg/dL   Creatinine, Ser 1.28 (L) 0.44 - 1.00 mg/dL   Calcium 9.5 8.9 - 78.6 mg/dL   Total Protein 7.0 6.5 - 8.1 g/dL   Albumin 3.1 (L) 3.5 - 5.0 g/dL   AST 32 15 - 41 U/L   ALT 44 0 - 44 U/L   Alkaline Phosphatase 99 38 - 126 U/L   Total Bilirubin 0.5 0.3 - 1.2 mg/dL   GFR, Estimated >76 >72 mL/min   Anion gap 11 5 - 15  CBC     Status: Abnormal   Collection Time: 08/03/21  3:45 PM  Result Value Ref Range   WBC 6.0 4.0 - 10.5 K/uL   RBC 4.45 3.87 - 5.11 MIL/uL   Hemoglobin 12.1 12.0 - 15.0 g/dL   HCT 09.4 (L) 70.9 - 62.8 %   MCV 80.0 80.0 - 100.0 fL   MCH 27.2 26.0 - 34.0 pg   MCHC 34.0 30.0 - 36.0 g/dL   RDW 36.6 29.4 - 76.5 %   Platelets 210 150 - 400 K/uL   nRBC 0.0 0.0 - 0.2 %  T4, free     Status: Abnormal   Collection Time: 08/03/21  3:45 PM  Result Value Ref Range   Free T4 3.42 (H) 0.61 - 1.12 ng/dL  Urinalysis, Routine w reflex microscopic Urine, Clean Catch     Status: Abnormal   Collection Time: 08/03/21  4:48 PM  Result Value Ref Range   Color, Urine [place] (A) YELLOW   APPearance HAZY (A) CLEAR   Specific Gravity, Urine 1.025 1.005 - 1.030   pH 6.0 5.0 - 8.0   Glucose, UA NEGATIVE NEGATIVE mg/dL   Hgb urine dipstick NEGATIVE NEGATIVE   Bilirubin Urine NEGATIVE NEGATIVE   Ketones, ur NEGATIVE NEGATIVE mg/dL   Protein, ur 30 (A) NEGATIVE mg/dL   Nitrite NEGATIVE NEGATIVE   Leukocytes,Ua MODERATE (A) NEGATIVE   RBC / HPF 0-5 0 - 5 RBC/hpf   WBC, UA 0-5 0 - 5 WBC/hpf   Bacteria, UA RARE (A) NONE SEEN   Squamous Epithelial / LPF 0-5 0 - 5   Mucus PRESENT    Ca Oxalate Crys, UA PRESENT     US OB Comp Less 14 Wks  Result Date: 08/03/2021 CLINICAL DATA:  First trimester pregnancy.  Hyperthyroidism.  EXAM: OBSTETRIC <14 WK ULTRASOUND TECHNIQUE: Transabdominal ultrasound was performed for evaluation of the gestation as well as the maternal uterus and adnexal regions. COMPARISON:  None. FINDINGS: Intrauterine gestational sac: Single Yolk sac:  Not Visualized. Embryo:  Visualized. Cardiac Activity: Visualized. Heart Rate: 153 bpm CRL:   66 mm   12 w 6 d                  [place] EDC: 02/09/2022 Subchorionic hemorrhage:  None visualized. Maternal uterus/adnexae: Both ovaries are normal in appearance. No mass or abnormal free fluid identified. IMPRESSION: Single living IUP with estimated gestational age of [redacted] weeks 6 days, and [place] EDC of 02/09/2022. No maternal uterine or adnexal abnormality identified. Electronically Signed   By: Danae Orleans M.D.   On: 08/03/2021  18:08     Assessment and Plan  Hyperemesis gravidarum  - Continue antiemetics as previously prescribed - Rx for Zofran 8 mg ODT every 8 hours prn N/V - Information provided on hyperemesis gravidarum   Hyperthyroidism affecting pregnancy in first trimester  - Rx Tapozole 5 mg po BID - Rx for Toprol XL 25 mg po qd x 2 weeks  [redacted] weeks gestation of pregnancy  - Records faxed to MAU by Dr. Willis Modena -- sent to medical records for scanning - Information provided on safe meds to take in pregnancy   - Discharge patient - Needs to F/U with [facility] (Dr. Gala Romney only) per patient preference - Patient verbalized an understanding of the plan of care and agrees.     Laury Deep, CNM 08/03/2021, 3:51 PM

## 2021-08-03 NOTE — MAU Note (Signed)
April Benson [facility] is a 26 y.o. at [redacted]w[redacted]d here in MAU reporting: Pt c/o emesis and HTN. Has had symptoms since 12/2. BP is high at home but says at [facility] is usually normal. Pt has hx of Graves and was taken off meds. Has lost 40lbs since 12/2.  Onset of complaint: 06/22/22 Pain score: 0/10 Vitals:   08/03/21 1520  BP: 138/81  Pulse: (!) 123  Resp: 16  Temp: 98.7 F (37.1 C)  SpO2: 100%     FHT: Lab orders placed from triage:

## 2021-08-03 NOTE — Discharge Instructions (Signed)

## 2021-08-04 LAB — T3: T3, Total: 557 ng/dL — ABNORMAL HIGH (ref 71–180)

## 2021-08-07 ENCOUNTER — Other Ambulatory Visit: Payer: Self-pay

## 2021-08-07 ENCOUNTER — Institutional Professional Consult (permissible substitution): Payer: BC Managed Care – PPO | Admitting: Obstetrics and Gynecology

## 2021-08-07 ENCOUNTER — Telehealth: Payer: Self-pay | Admitting: *Deleted

## 2021-08-07 DIAGNOSIS — E0590 Thyrotoxicosis, unspecified without thyrotoxic crisis or storm: Secondary | ICD-10-CM

## 2021-08-07 NOTE — Telephone Encounter (Signed)
Left patient an urgent message with appointment information and co-pay amount due at check-in.

## 2021-08-07 NOTE — Progress Notes (Signed)
Amb ref to MFM sent per Dr.Leggett

## 2021-08-08 ENCOUNTER — Other Ambulatory Visit: Payer: Self-pay | Admitting: Obstetrics

## 2021-08-08 DIAGNOSIS — Z363 Encounter for antenatal screening for malformations: Secondary | ICD-10-CM

## 2021-08-09 ENCOUNTER — Ambulatory Visit: Payer: BC Managed Care – PPO | Admitting: Obstetrics & Gynecology

## 2021-08-09 ENCOUNTER — Other Ambulatory Visit: Payer: Self-pay

## 2021-08-09 ENCOUNTER — Encounter: Payer: Self-pay | Admitting: Obstetrics & Gynecology

## 2021-08-09 VITALS — BP 117/72 | HR 106 | Ht 63.0 in | Wt 175.0 lb

## 2021-08-09 DIAGNOSIS — Z3490 Encounter for supervision of normal pregnancy, unspecified, unspecified trimester: Secondary | ICD-10-CM | POA: Insufficient documentation

## 2021-08-09 DIAGNOSIS — O219 Vomiting of pregnancy, unspecified: Secondary | ICD-10-CM

## 2021-08-09 DIAGNOSIS — Z3A13 13 weeks gestation of pregnancy: Secondary | ICD-10-CM | POA: Diagnosis not present

## 2021-08-09 DIAGNOSIS — O99280 Endocrine, nutritional and metabolic diseases complicating pregnancy, unspecified trimester: Secondary | ICD-10-CM

## 2021-08-09 DIAGNOSIS — E0590 Thyrotoxicosis, unspecified without thyrotoxic crisis or storm: Secondary | ICD-10-CM | POA: Insufficient documentation

## 2021-08-09 DIAGNOSIS — O210 Mild hyperemesis gravidarum: Secondary | ICD-10-CM

## 2021-08-09 DIAGNOSIS — O0990 Supervision of high risk pregnancy, unspecified, unspecified trimester: Secondary | ICD-10-CM | POA: Insufficient documentation

## 2021-08-09 HISTORY — DX: Vomiting of pregnancy, unspecified: O21.9

## 2021-08-09 NOTE — Progress Notes (Addendum)
OBSTETRIC/GYNECOLOGY OFFICE VISIT NOTE  History:   April Benson is a 26 y.o. G1P0 at [redacted]w[redacted]d here today for follow up after recent MAU visit on 08/03/2021; she was evaluated for hyperemesis and for possible thyroid storm.  Was a patient of [facility], in the process of transferring her care to here. History of Graves disease and HTN, followed by Endocrinologist.  During her MAU visit, she was started on Tapazole 5 mg po bid and Toprol XL 25 mg po qd x 2 weeks. She was also prescribed multiple antiemetics.  She reports she is able to keep some food down, toast and baked potatoes and these medications. Unable to take any kind of vitamins and not taking adequate hydration.  She desires IV infusion of fluids and MVI to help with her symptoms.  She denies any chest pain, palpitations, abnormal vaginal discharge, bleeding, pelvic pain or other concerns.    Past Medical History:  Diagnosis Date   Hypertension    Hyperthyroidism    Tachycardia     Past Surgical History:  Procedure Laterality Date   NO PAST SURGERIES      The following portions of the patient's history were reviewed and updated as appropriate: allergies, current medications, past family history, past medical history, past social history, past surgical history and problem list.   Review of Systems:  Pertinent items noted in HPI and remainder of comprehensive ROS otherwise negative.  Physical Exam:  BP 117/72    Pulse (!) 106    Ht 5\' 3"  (1.6 m)    Wt 175 lb (79.4 kg)    BMI 31.00 kg/m  FHR 154 bpm CONSTITUTIONAL: Well-developed, well-nourished female in no acute distress.  HEENT:  Normocephalic, atraumatic. External right and left ear normal. No scleral icterus.  NECK: Normal range of motion, supple, no masses noted on observation SKIN: No rash noted. Not diaphoretic. No erythema. No pallor. MUSCULOSKELETAL: Normal range of motion. No edema noted. NEUROLOGIC: Alert and oriented to person, place, and time. Normal muscle tone  coordination. No cranial nerve deficit noted. PSYCHIATRIC: Normal mood and affect. Normal behavior. Normal judgment and thought content. CARDIOVASCULAR: Mildly elevated heart rate noted RESPIRATORY: Effort and breath sounds normal, no problems with respiration noted ABDOMEN: No masses noted. No other overt distention noted.   PELVIC: Deferred  Labs and Imaging Results for orders placed or performed during the hospital encounter of 08/03/21 (from the past 168 hour(s))  TSH   Collection Time: 08/03/21  3:45 PM  Result Value Ref Range   TSH <0.010 (L) 0.350 - 4.500 uIU/mL  Comprehensive metabolic panel   Collection Time: 08/03/21  3:45 PM  Result Value Ref Range   Sodium 139 135 - 145 mmol/L   Potassium 3.7 3.5 - 5.1 mmol/L   Chloride 106 98 - 111 mmol/L   CO2 22 22 - 32 mmol/L   Glucose, Bld 98 70 - 99 mg/dL   BUN 5 (L) 6 - 20 mg/dL   Creatinine, Ser 08/05/21 (L) 0.44 - 1.00 mg/dL   Calcium 9.5 8.9 - 6.14 mg/dL   Total Protein 7.0 6.5 - 8.1 g/dL   Albumin 3.1 (L) 3.5 - 5.0 g/dL   AST 32 15 - 41 U/L   ALT 44 0 - 44 U/L   Alkaline Phosphatase 99 38 - 126 U/L   Total Bilirubin 0.5 0.3 - 1.2 mg/dL   GFR, Estimated 43.1 >54 mL/min   Anion gap 11 5 - 15  CBC   Collection Time: 08/03/21  3:45 PM  Result Value Ref Range   WBC 6.0 4.0 - 10.5 K/uL   RBC 4.45 3.87 - 5.11 MIL/uL   Hemoglobin 12.1 12.0 - 15.0 g/dL   HCT 81.8 (L) 29.9 - 37.1 %   MCV 80.0 80.0 - 100.0 fL   MCH 27.2 26.0 - 34.0 pg   MCHC 34.0 30.0 - 36.0 g/dL   RDW 69.6 78.9 - 38.1 %   Platelets 210 150 - 400 K/uL   nRBC 0.0 0.0 - 0.2 %  T3   Collection Time: 08/03/21  3:45 PM  Result Value Ref Range   T3, Total 557 (H) 71 - 180 ng/dL  T4, free   Collection Time: 08/03/21  3:45 PM  Result Value Ref Range   Free T4 3.42 (H) 0.61 - 1.12 ng/dL  Urinalysis, Routine w reflex microscopic Urine, Clean Catch   Collection Time: 08/03/21  4:48 PM  Result Value Ref Range   Color, Urine [place] (A) YELLOW   APPearance HAZY (A)  CLEAR   Specific Gravity, Urine 1.025 1.005 - 1.030   pH 6.0 5.0 - 8.0   Glucose, UA NEGATIVE NEGATIVE mg/dL   Hgb urine dipstick NEGATIVE NEGATIVE   Bilirubin Urine NEGATIVE NEGATIVE   Ketones, ur NEGATIVE NEGATIVE mg/dL   Protein, ur 30 (A) NEGATIVE mg/dL   Nitrite NEGATIVE NEGATIVE   Leukocytes,Ua MODERATE (A) NEGATIVE   RBC / HPF 0-5 0 - 5 RBC/hpf   WBC, UA 0-5 0 - 5 WBC/hpf   Bacteria, UA RARE (A) NONE SEEN   Squamous Epithelial / LPF 0-5 0 - 5   Mucus PRESENT    Ca Oxalate Crys, UA PRESENT    US OB Comp Less 14 Wks  Result Date: 08/03/2021 CLINICAL DATA:  First trimester pregnancy.  Hyperthyroidism. EXAM: OBSTETRIC <14 WK ULTRASOUND TECHNIQUE: Transabdominal ultrasound was performed for evaluation of the gestation as well as the maternal uterus and adnexal regions. COMPARISON:  None. FINDINGS: Intrauterine gestational sac: Single Yolk sac:  Not Visualized. Embryo:  Visualized. Cardiac Activity: Visualized. Heart Rate: 153 bpm CRL:   66 mm   12 w 6 d                  [place] EDC: 02/09/2022 Subchorionic hemorrhage:  None visualized. Maternal uterus/adnexae: Both ovaries are normal in appearance. No mass or abnormal free fluid identified. IMPRESSION: Single living IUP with estimated gestational age of [redacted] weeks 6 days, and [place] EDC of 02/09/2022. No maternal uterine or adnexal abnormality identified. Electronically Signed   By: Danae Orleans M.D.   On: 08/03/2021 18:08      Assessment and Plan:     1. Hyperthyroidism in pregnancy, antepartum Continue Tapazole 5 mg po bid and Toprol XL 25 mg po qd x 2 weeks.  Patient is aware of symptoms of thyroid storm, precautions reviewed.  Follow up with endocrinologist.  2. [redacted] weeks gestation of pregnancy New OB appointment to be scheduled here soon, awaiting records from [facility]. Already scheduled for consultation with MFM, anatomy scan also scheduled.  3. Hyperemesis gravidarum, antepartum Patient was ordered for outpatient IV fluids and vitamin  infusions twice a week, these will be set up via the [facility]. She was cautioned to let us know if N/V prohibits her from keeping down her medications; was told to continue antiemetics as prescribed.  - M.V.I. Adult (INFUVITE ADULT) 10 mL in dextrose 5% lactated ringers 1,000 mL infusion - folic acid 1 mg in sodium chloride 0.9 %  50 mL IVPB   Return in about 2 weeks (around 08/23/2021) for New OB appointment.    I spent 30 minutes dedicated to the care of this patient including pre-visit review of records, face to face time with the patient discussing her conditions and treatments and post visit orders.    Jaynie CollinsUGONNA  Jax Kentner, MD, FACOG Obstetrician & Gynecologist, [facility], [facility]

## 2021-08-09 NOTE — Progress Notes (Signed)
NOB information collected

## 2021-08-10 ENCOUNTER — Other Ambulatory Visit (HOSPITAL_COMMUNITY): Payer: Self-pay | Admitting: *Deleted

## 2021-08-10 NOTE — Progress Notes (Signed)
Spoke with patient and let her know we scheduled her IVF in the infusion clinic this Monday 1/23 at 1100.  Also gave her directions to our clinic and confirmed she knew we were inside [place] [facility].

## 2021-08-13 ENCOUNTER — Encounter (HOSPITAL_COMMUNITY)
Admission: RE | Admit: 2021-08-13 | Discharge: 2021-08-13 | Disposition: A | Discharge status: Home / Self Care | Payer: BC Managed Care – PPO | Source: Ambulatory Visit | Attending: Obstetrics & Gynecology | Admitting: Obstetrics & Gynecology

## 2021-08-13 ENCOUNTER — Other Ambulatory Visit: Payer: Self-pay

## 2021-08-13 DIAGNOSIS — Z3A00 Weeks of gestation of pregnancy not specified: Secondary | ICD-10-CM | POA: Diagnosis not present

## 2021-08-13 DIAGNOSIS — O99282 Endocrine, nutritional and metabolic diseases complicating pregnancy, second trimester: Secondary | ICD-10-CM | POA: Diagnosis present

## 2021-08-13 DIAGNOSIS — O210 Mild hyperemesis gravidarum: Secondary | ICD-10-CM | POA: Insufficient documentation

## 2021-08-13 DIAGNOSIS — E0590 Thyrotoxicosis, unspecified without thyrotoxic crisis or storm: Secondary | ICD-10-CM | POA: Diagnosis not present

## 2021-08-13 MED ORDER — THIAMINE HCL 100 MG/ML IJ SOLN
INTRAVENOUS | Status: DC
  Filled 2021-08-13: qty 1000

## 2021-08-14 ENCOUNTER — Inpatient Hospital Stay (HOSPITAL_COMMUNITY): Admission: RE | Admit: 2021-08-14 | Payer: BC Managed Care – PPO | Source: Ambulatory Visit

## 2021-08-15 ENCOUNTER — Encounter: Payer: Self-pay | Admitting: *Deleted

## 2021-08-15 ENCOUNTER — Ambulatory Visit: Payer: Medicaid Other | Attending: Obstetrics & Gynecology

## 2021-08-15 ENCOUNTER — Ambulatory Visit (HOSPITAL_BASED_OUTPATIENT_CLINIC_OR_DEPARTMENT_OTHER): Payer: Medicaid Other | Admitting: Maternal & Fetal Medicine

## 2021-08-15 ENCOUNTER — Other Ambulatory Visit: Payer: Self-pay

## 2021-08-15 ENCOUNTER — Ambulatory Visit: Payer: Medicaid Other | Admitting: *Deleted

## 2021-08-15 ENCOUNTER — Other Ambulatory Visit: Payer: Self-pay | Admitting: *Deleted

## 2021-08-15 VITALS — BP 126/72 | HR 101

## 2021-08-15 DIAGNOSIS — E668 Other obesity: Secondary | ICD-10-CM

## 2021-08-15 DIAGNOSIS — E0590 Thyrotoxicosis, unspecified without thyrotoxic crisis or storm: Secondary | ICD-10-CM

## 2021-08-15 DIAGNOSIS — O99282 Endocrine, nutritional and metabolic diseases complicating pregnancy, second trimester: Secondary | ICD-10-CM | POA: Diagnosis not present

## 2021-08-15 DIAGNOSIS — O99280 Endocrine, nutritional and metabolic diseases complicating pregnancy, unspecified trimester: Secondary | ICD-10-CM | POA: Insufficient documentation

## 2021-08-15 DIAGNOSIS — O99212 Obesity complicating pregnancy, second trimester: Secondary | ICD-10-CM

## 2021-08-15 DIAGNOSIS — Z3689 Encounter for other specified antenatal screening: Secondary | ICD-10-CM | POA: Diagnosis not present

## 2021-08-15 DIAGNOSIS — O219 Vomiting of pregnancy, unspecified: Secondary | ICD-10-CM

## 2021-08-15 DIAGNOSIS — Z3A14 14 weeks gestation of pregnancy: Secondary | ICD-10-CM | POA: Diagnosis not present

## 2021-08-15 DIAGNOSIS — O0992 Supervision of high risk pregnancy, unspecified, second trimester: Secondary | ICD-10-CM | POA: Insufficient documentation

## 2021-08-15 DIAGNOSIS — Z363 Encounter for antenatal screening for malformations: Secondary | ICD-10-CM | POA: Diagnosis present

## 2021-08-15 DIAGNOSIS — O10912 Unspecified pre-existing hypertension complicating pregnancy, second trimester: Secondary | ICD-10-CM

## 2021-08-16 ENCOUNTER — Encounter (HOSPITAL_COMMUNITY)
Admission: RE | Admit: 2021-08-16 | Discharge: 2021-08-16 | Disposition: A | Discharge status: Home / Self Care | Payer: BC Managed Care – PPO | Source: Ambulatory Visit | Attending: Obstetrics & Gynecology | Admitting: Obstetrics & Gynecology

## 2021-08-16 DIAGNOSIS — O210 Mild hyperemesis gravidarum: Secondary | ICD-10-CM | POA: Diagnosis not present

## 2021-08-16 MED ORDER — THIAMINE HCL 100 MG/ML IJ SOLN
INTRAVENOUS | Status: DC
  Filled 2021-08-16: qty 1000

## 2021-08-20 ENCOUNTER — Other Ambulatory Visit: Payer: Self-pay

## 2021-08-20 ENCOUNTER — Encounter (HOSPITAL_COMMUNITY)
Admission: RE | Admit: 2021-08-20 | Discharge: 2021-08-20 | Disposition: A | Discharge status: Home / Self Care | Payer: BC Managed Care – PPO | Source: Ambulatory Visit | Attending: Obstetrics & Gynecology | Admitting: Obstetrics & Gynecology

## 2021-08-20 DIAGNOSIS — O210 Mild hyperemesis gravidarum: Secondary | ICD-10-CM | POA: Diagnosis not present

## 2021-08-20 MED ORDER — THIAMINE HCL 100 MG/ML IJ SOLN
INTRAVENOUS | Status: DC
  Filled 2021-08-20: qty 1000

## 2021-08-23 ENCOUNTER — Encounter (HOSPITAL_COMMUNITY)
Admission: RE | Admit: 2021-08-23 | Discharge: 2021-08-23 | Disposition: A | Discharge status: Home / Self Care | Payer: Medicaid Other | Source: Ambulatory Visit | Attending: Obstetrics & Gynecology | Admitting: Obstetrics & Gynecology

## 2021-08-23 ENCOUNTER — Other Ambulatory Visit: Payer: Self-pay

## 2021-08-23 DIAGNOSIS — E0590 Thyrotoxicosis, unspecified without thyrotoxic crisis or storm: Secondary | ICD-10-CM | POA: Insufficient documentation

## 2021-08-23 DIAGNOSIS — O210 Mild hyperemesis gravidarum: Secondary | ICD-10-CM | POA: Insufficient documentation

## 2021-08-23 DIAGNOSIS — Z3A00 Weeks of gestation of pregnancy not specified: Secondary | ICD-10-CM | POA: Diagnosis not present

## 2021-08-23 DIAGNOSIS — O99282 Endocrine, nutritional and metabolic diseases complicating pregnancy, second trimester: Secondary | ICD-10-CM | POA: Diagnosis present

## 2021-08-23 MED ORDER — THIAMINE HCL 100 MG/ML IJ SOLN
INTRAVENOUS | Status: DC
  Filled 2021-08-23: qty 1000

## 2021-08-23 NOTE — Progress Notes (Signed)
MFM Consultation  April Benson is a 26 yo G1P0 who is seen at 69 w 5 d with an EDD of 05/04/22. She is seen at the request of Dr. Jaynie Collins.  April Benson is overall doing well today. She feels much better after initiating IV therapy due to persistent nausea and vomiting.   She has not performed genetic screening but is planning on it.   She was evaluated for HG vs thyroid storm. She is a recent transfer from to [facility] from [facility].  She has known Graves disease management by her endocrinologist.   Prior TSH labs 1 month ago were. She was started on Methimazole 5 mg BID.      TSH 0.450 - 4.500 uIU/mL <0.005 Low    T4, Total 4.5 - 12.0 ug/dL >40.9 High    T3 Uptake 24 - 39 % 49 High    Free Thyroxine Index 1.2 - 4.9 >12.2 High     Her follow up TFT's 08/03/21  TSH 0.01 FT3- 557 elevated Ft4 3.42 elevated   Pregnancy related issues include the following:  1) Chronic hypertension Her current blood pressure is normal. She denies headache or vision changes.  She is taking labetalol 100 mg BIDand low dose ASA. No episodes of hospitalizations or ER visits related to her blood pressure.   2) Hyperthyroidism Graves disease:  She was diagnosed 6 years ago and was on Methimazole 40 mg/day. She was seen in December 25 2020 with documented tremors, diaphoresis and palpitations.   Currently taking methimazole 5 mg BID and metoprolol. 25 mg. She has symptoms of tachycardia. Her N/V has improved with IV therapy and she reports overall feeling better since being able to keep her medication down.  Most recent MAU visit was on 12/30 and 1/13.  She is seeing an endocrinologist who is titrating her medications.  Past Medical History:  Diagnosis Date   Hypertension    Hyperthyroidism    Tachycardia    Past Surgical History:  Procedure Laterality Date   NO PAST SURGERIES     Family History  Problem Relation Age of Onset   Diabetes Mother    Hypothyroidism Mother    Luiz Blare'  disease Mother     Current Outpatient Medications (Endocrine & Metabolic):    methimazole (TAPAZOLE) 5 MG tablet, Take 1 tablet (5 mg total) by mouth 2 (two) times daily.   Current Outpatient Medications (Cardiovascular):    labetalol (NORMODYNE) 100 MG tablet, Take 50 mg by mouth 2 (two) times daily. (Patient not taking: Reported on 08/15/2021)   metoprolol succinate (TOPROL-XL) 25 MG 24 hr tablet, Take 1 tablet (25 mg total) by mouth daily.   Current Outpatient Medications (Respiratory):    promethazine (PHENERGAN) 25 MG suppository, Place 1 suppository (25 mg total) rectally every 6 (six) hours as needed for nausea or vomiting.       Facility-Administered Medications Ordered in Other Visits (Hematological):    lactated ringers 1,000 mL with thiamine 100 mg, folic acid 1 mg, M.V.I. Adult 10 mL infusion*  Current Outpatient Medications (Other):    famotidine (PEPCID) 20 MG tablet, Take 1 tablet (20 mg total) by mouth 2 (two) times daily.   ondansetron (ZOFRAN-ODT) 8 MG disintegrating tablet, Take 1 tablet (8 mg total) by mouth every 8 (eight) hours as needed for nausea or vomiting.   scopolamine (TRANSDERM-SCOP) 1 MG/3DAYS, Place 1 patch (1.5 mg total) onto the skin every 3 (three) days.   Facility-Administered Medications Ordered in Other Visits (Other):  lactated ringers 1,000 mL with thiamine 100 mg, folic acid 1 mg, M.V.I. Adult 10 mL infusion* * These medications belong to multiple therapeutic classes and are listed under each applicable group. No current facility-administered medications for this visit. Social History   Socioeconomic History   Marital status: Married    Spouse name: Not on file   Number of children: Not on file   Years of education: Not on file   Highest education level: Not on file  Occupational History   Not on file  Tobacco Use   Smoking status: Never   Smokeless tobacco: Never  Vaping Use   Vaping Use: Never used  Substance and Sexual  Activity   Alcohol use: Not Currently   Drug use: Never   Sexual activity: Yes  Other Topics Concern   Not on file  Social History Narrative   ** Merged History Encounter **       Social Determinants of Health   Financial Resource Strain: Not on file  Food Insecurity: Not on file  Transportation Needs: Not on file  Physical Activity: Not on file  Stress: Not on file  Social Connections: Not on file  Intimate Partner Violence: Not on file   No Known Allergies   Imaging:   Single intrauterine pregnancy with measurements consistent  with dates.  Normal first timester anatomy observed, a detailed exam is  precluded given the early gestation.  Good fetal movement and amniotic fluid observed.    Impression/Counseling:  Chronic hypertension: April. April Benson's blood pressure is overall normal and has been throughout the pregnancy based on her [facility] prenatal records. She denies hypertensive episodes.  I explained that women with chronic hypertension are at an increased risk for preeclampsia, placental abruption and fetal growth restriction in women with chronic hypertension.   She will start taking low dose ASA for preeclampsia prevention.  I explained that serial growth exams should be performed every 4 weeks throughout the pregnancy.   Medical therapy should be increased if her blood pressure persist >150/100's. Given that she is on medical therapy she should start weekly testing at 32 weeks.   She is aware of the s/sx or preeclampsia including headache, vision changes and right upper quadrant pain.  Baseline labs of UPC, CBC, and CMP should be performed if not performed previously.   Hyperthyroidism: The prevalence of hyperthyroidism in pregnant women ranges from 0.05% to 0.2%. The signs and symptoms of mild to moderate hyperthyroidism--heat intolerance, diaphoresis, fatigue, anxiety, emotional lability, tachycardia, and a wide pulse pressure--can be  mimicked by the hypermetabolic state of normal pregnancy. However, weight loss, tachycardia greater than 100?beats/min, and diffuse goiter are features that may suggest hyperthyroidism. Thyrotoxicosis usually presents in the late first or early second trimester. Thyroid storm occurs in 1-2% of pregnancies complicated by hyperthyroidism. It manifests with fever, diaphoresis, cardiac abnormalities (tachycardia, arrhythmia, heart failure), CNS abnormalities (irritability, agitation, tremor, mental status change), GI (nausea, vomiting, diarrhea), and laboratory changes (leukocytosis, elevated LFT, elevated free T3/T4).  She is being seen by MFM and an endocrinologist.  Complications associated with poorly controlled hyperthyroidism include hypertensive disorders in pregnancy, thyroid storm, heart failure, preterm labor, placental abruption, growth restriction, and neonatal death. Specifically, the two most serious maternal complications of untreated hyperthyroidism are heart failure and thyroid storm, a medical emergency associated with a maternal mortality rate of 25% even with appropriate management. Heart failure is the more common of the two serious maternal complications. It is caused by the long-term myocardial effects  of T4 and is intensified by other pregnancy conditions such as preeclampsia, infection, or anemia. There is a risk of fetal and/or neonatal thyrotoxicosis due to the passive transplacental passage of immunoglobulins associated with Graves disease. Such antibodies are able to exert an effect on the fetal thyroid at [redacted] weeks gestation. Infants born to mothers with high titers of antibodies or poorly controlled [phone]  are particularly at risk. Fetal thyrotoxicosis may result in preterm delivery and the development of fetal craniosynostosis, exophthalmos, heart failure (hydrops fetalis), hepatosplenomegaly, thrombocytopenia, exophthalmos, goiter (with neck obstruction and polyhydramnios),  and growth restriction. Additional neonatal features include jaundice, poor feeding, poor weight gain, and irritability. The mortality of the condition may be as high as 25%.   The goal of therapy is to control the hyperthyroidism without causing fetal or neonatal hypothyroidism. Maternal free T4 should be maintained in the high-normal range or total T4 at 1.5 times the upper limit of normal (nonpregnant) range. There is a trade off in adverse effects on mother and fetus between the two main drugs used to manage maternal hyperthyroidism. Propylthiouracil (PTU) was considered the drug of choice in young women who may become pregnant and in pregnant patients with hyperthyroidism. This preference was based on reports of methimazole embryopathy that was not seen with PTU, although a later report linked PTU with cutis aplasia, a scalp deformity. Reports describing higher hepatotoxicity profiles for PTU compared with methimazole, especially in children and adolescents, have raised concern regarding the use of PTU in the management of hyperthyroidism even in pregnant patients. These concerns had to be balanced by the known embryopathy associated with methimazole. The transplacental passage of the two drugs is similar. Methimazole may cause cutis aplasia, a scalp deformity. Although rare, there are reports of methimazole and carbimazole embryopathy with choanal atresia, tracheoesophageal fistula, and facial anomalies..T4 levels should initially be measured at 2-4 week intervals. PTU is given every 8 hours at doses of 100 to 150?mg (300 to 450?mg total daily dosage) according to thyrotoxicosis severity. Equivalent doses of PTU relative to methimazole are 10?:?1 or 15?:?1 (100?mg of PTU is equivalent to 7.5 to 10?mg of methimazole). Usual starting doses are up to 300?mg of PTU and up to 20?mg of methimazole. The occasional patient may require higher doses because the risk of uncontrolled hyperthyroidism is greater than that  of high-dose PTU or methimazole. It can take 6 to 8 weeks for major clinical effects to manifes.t Lastly, a subtotal thyroidectomy can be performed after thyroid toxicosis is medically controlled, but is seldom done during pregnancy. Ablation with therapeutic radioactive iodine is contraindicated during pregnancy.  After the patient is euthyroid (reflected by monthly free T4 and free T3 values), the dose of antithyroid drugs should be tapered (e.g., halved), with further reduction as the pregnancy progresses. For many patients, antithyroid drugs can be discontinued by 32 to [redacted] weeks gestation, because remission of Graves disease during pregnancy is commonly observed, although relapse often occurs after delivery. Maternal side effects of PTU or methimazole treatment can include rash (?5%), pruritus, drug-related fever, hepatitis, a lupus-like syndrome, and bronchospasm. The alternative thionamide can be used, although cross-sensitivity occurs in 50% of patients. Agranulocytosis, which is the most serious side effect, develops in only 0.1%, occurring especially in older women and those receiving higher doses. All patients experiencing fever or unexpected sore throat on therapy should discontinue the drug and have white blood cell count monitoring. Agranulocytosis is a contraindication to further thionamide therapy; the blood count gradually improves over days  or weeks.  PTU is not significantly concentrated in breast milk (10% of serum) and does not appear to affect the infants thyroid hormone levels in any major way. Methimazole also does not appear to affect subsequent somatic or intellectual growth in children exposed to it during lactation. Antithyroid medication should be taken just after breastfeeding, allowing a 3- to 4-hour interval before the woman lactates again. In patients who are in remission, the postpartum period of a subsequent pregnancy is significantly associated with relapse of Graves disease  compared with those without a subsequent pregnancy.   In cases of thyroid storm, the following recommendations are encouraged:  initial treatment consists of laboratory evaluation, cooling blankets, telemetry and O2 therapy. Initial medical treatment is with PTU PO or by NG tube (300-600mg  then 150-300mg  q6hr). 1-2 hours after PTU is given, iodine should be given to inhibit T4 release. It is essential to delay iodine administration until after PTU is given. Options include oral potassium iodide (SSKI) 2-5 drops PO q8 hours, Lugol solution 8 drops q 6 hours, or IV sodium iodide 500-1000mg  every 8 hours. If these are unavailable oral gastrografin (Oragrafin, 62% iodide) 3g PO x 1 can be given and will suppress T4 release for 2-3 days. Glucocordicoids should also be initiated to prevent release and peripheral conversion of stored T4. Options include dexamethasone (2mg  IV/IM q 6 hours x 4 doses), hydrocortisone (300mg  IV daily) or prednisone 60mg  PO daily). Beta blockers and phenobarbital can be used as needed to control tachycardia and agitation. Further management and timing and route of delivery is individualized and best accomplished in a multidisciplinary fashion.  Recommendations: 1) Continue serial growth exams 2) Repeat TFT's every 4 weeks titrate Methimazole for euthyroid 3) Initiate weekly testing at 32 weeks.  I spent 60 minutes with April April Benson with > 50% in face to face consultation.  Novella Oliveorenthian J. Nahal Wanless, MD

## 2021-08-27 ENCOUNTER — Other Ambulatory Visit: Payer: Self-pay

## 2021-08-27 ENCOUNTER — Encounter (HOSPITAL_COMMUNITY)
Admission: RE | Admit: 2021-08-27 | Discharge: 2021-08-27 | Disposition: A | Discharge status: Home / Self Care | Payer: Medicaid Other | Source: Ambulatory Visit | Attending: Obstetrics & Gynecology | Admitting: Obstetrics & Gynecology

## 2021-08-27 DIAGNOSIS — O210 Mild hyperemesis gravidarum: Secondary | ICD-10-CM | POA: Diagnosis not present

## 2021-08-27 MED ORDER — THIAMINE HCL 100 MG/ML IJ SOLN
INTRAVENOUS | Status: DC
  Filled 2021-08-27: qty 1000

## 2021-08-29 NOTE — Progress Notes (Signed)
Pharmacy called and stated [facility] is on national back order and they do not know when they will have any in stock, but they can still add the folic acid and the thiamine to the fluids.  Let Dr Macon Large know and she stated that was fine.  Also made [facility] Pharmacist today and Renard Hamper, Pharmacist tomorrow aware.

## 2021-08-30 ENCOUNTER — Other Ambulatory Visit: Payer: Self-pay

## 2021-08-30 ENCOUNTER — Encounter (HOSPITAL_COMMUNITY)
Admission: RE | Admit: 2021-08-30 | Discharge: 2021-08-30 | Disposition: A | Discharge status: Home / Self Care | Payer: Medicaid Other | Source: Ambulatory Visit | Attending: Obstetrics & Gynecology | Admitting: Obstetrics & Gynecology

## 2021-08-30 DIAGNOSIS — O210 Mild hyperemesis gravidarum: Secondary | ICD-10-CM | POA: Diagnosis not present

## 2021-08-30 MED ORDER — THIAMINE HCL 100 MG/ML IJ SOLN
INTRAVENOUS | Status: DC
  Filled 2021-08-30: qty 1000

## 2021-09-03 ENCOUNTER — Other Ambulatory Visit: Payer: Self-pay

## 2021-09-03 ENCOUNTER — Encounter (HOSPITAL_COMMUNITY)
Admission: RE | Admit: 2021-09-03 | Discharge: 2021-09-03 | Disposition: A | Discharge status: Home / Self Care | Payer: Medicaid Other | Source: Ambulatory Visit | Attending: Obstetrics & Gynecology | Admitting: Obstetrics & Gynecology

## 2021-09-03 DIAGNOSIS — O210 Mild hyperemesis gravidarum: Secondary | ICD-10-CM | POA: Diagnosis not present

## 2021-09-03 MED ORDER — THIAMINE HCL 100 MG/ML IJ SOLN
INTRAVENOUS | Status: DC
  Filled 2021-09-03: qty 1000

## 2021-09-04 ENCOUNTER — Telehealth: Payer: Self-pay | Admitting: *Deleted

## 2021-09-04 NOTE — Telephone Encounter (Signed)
Left patient an urgent message to call or come to the office to sign records release form for [facility] prior to scheduled appointment on 09/10/2021 at 1:50 with an 1:35 PM arrival time.

## 2021-09-06 ENCOUNTER — Encounter (HOSPITAL_COMMUNITY)
Admission: RE | Admit: 2021-09-06 | Discharge: 2021-09-06 | Disposition: A | Discharge status: Home / Self Care | Payer: Medicaid Other | Source: Ambulatory Visit | Attending: Obstetrics & Gynecology | Admitting: Obstetrics & Gynecology

## 2021-09-06 DIAGNOSIS — O210 Mild hyperemesis gravidarum: Secondary | ICD-10-CM | POA: Diagnosis not present

## 2021-09-06 MED ORDER — THIAMINE HCL 100 MG/ML IJ SOLN
INTRAVENOUS | Status: DC
  Filled 2021-09-06: qty 1000

## 2021-09-10 ENCOUNTER — Other Ambulatory Visit: Payer: Self-pay

## 2021-09-10 ENCOUNTER — Ambulatory Visit (INDEPENDENT_AMBULATORY_CARE_PROVIDER_SITE_OTHER): Payer: Medicaid Other | Admitting: Family Medicine

## 2021-09-10 ENCOUNTER — Encounter (HOSPITAL_COMMUNITY)
Admission: RE | Admit: 2021-09-10 | Discharge: 2021-09-10 | Disposition: A | Discharge status: Home / Self Care | Payer: Medicaid Other | Source: Ambulatory Visit | Attending: Obstetrics & Gynecology | Admitting: Obstetrics & Gynecology

## 2021-09-10 VITALS — BP 112/67 | HR 100 | Wt 182.0 lb

## 2021-09-10 DIAGNOSIS — Z3480 Encounter for supervision of other normal pregnancy, unspecified trimester: Secondary | ICD-10-CM | POA: Diagnosis not present

## 2021-09-10 DIAGNOSIS — O210 Mild hyperemesis gravidarum: Secondary | ICD-10-CM | POA: Diagnosis not present

## 2021-09-10 DIAGNOSIS — O99280 Endocrine, nutritional and metabolic diseases complicating pregnancy, unspecified trimester: Secondary | ICD-10-CM

## 2021-09-10 DIAGNOSIS — O219 Vomiting of pregnancy, unspecified: Secondary | ICD-10-CM

## 2021-09-10 DIAGNOSIS — E0590 Thyrotoxicosis, unspecified without thyrotoxic crisis or storm: Secondary | ICD-10-CM

## 2021-09-10 DIAGNOSIS — O0992 Supervision of high risk pregnancy, unspecified, second trimester: Secondary | ICD-10-CM

## 2021-09-10 MED ORDER — THIAMINE HCL 100 MG/ML IJ SOLN
INTRAVENOUS | Status: DC
  Filled 2021-09-10: qty 1000

## 2021-09-10 NOTE — Progress Notes (Signed)
ROB transfer from [facility] 18.3 wks. Has new OB labs, GC/CC, genetic screening at [facility]. Has signed release and paid their fee, but prenatal records have not been sent. In MAU 08/03/21. Emesis, HTN, 40 lb weight loss, concern for thyroid storm with history of Graves. States feeling better since starting infusions. Reports has not lost anymore weight.

## 2021-09-10 NOTE — Patient Instructions (Signed)

## 2021-09-10 NOTE — Progress Notes (Signed)
° ° °  PRENATAL VISIT NOTE  Subjective:  April Benson is a 26 y.o. G1P0 at [redacted]w[redacted]d being seen today for transferring prenatal care from [facility].  She is currently monitored for the following issues for this high-risk pregnancy and has Supervision of high-risk pregnancy; Hyperthyroidism in pregnancy, antepartum; and Nausea/vomiting in pregnancy on their problem list.  Patient reports fatigue, nausea, and vomiting.  Contractions: Not present. Vag. Bleeding: None.  Movement: Present. Denies leaking of fluid.   The following portions of the patient's history were reviewed and updated as appropriate: allergies, current medications, past family history, past medical history, past social history, past surgical history and problem list.   Objective:   Vitals:   09/10/21 1332  BP: 112/67  Pulse: 100  Weight: 182 lb (82.6 kg)    Fetal Status: Fetal Heart Rate (bpm): 141   Movement: Present     General:  Alert, oriented and cooperative. Patient is in no acute distress.  Skin: Skin is warm and dry. No rash noted.   Cardiovascular: Normal heart rate noted  Respiratory: Normal respiratory effort, no problems with respiration noted  Abdomen: Soft, gravid, appropriate for gestational age.  Pain/Pressure: Absent     Pelvic: Cervical exam deferred        Extremities: Normal range of motion.  Edema: None  Mental Status: Normal mood and affect. Normal behavior. Normal judgment and thought content.   Assessment and Plan:  Pregnancy: G1P0 at [redacted]w[redacted]d 1. Hyperthyroidism in pregnancy, antepartum On tapazole and betablocker--for repeat testing today-managed by Endocrine - TSH - T3, free - T4, free  2. Nausea/vomiting in pregnancy On 2x/wk infusions, doing well  3. Supervision of high risk pregnancy in second trimester Has anatomy u/s scheduled Awaiting [facility]OB labs from [facility] Reports LR NIPT AFP today - AFP, Serum, Open Spina Bifida  General obstetric precautions including but not limited to vaginal  bleeding, contractions, leaking of fluid and fetal movement were reviewed in detail with the patient. Please refer to After Visit Summary for other counseling recommendations.   Return in 2 weeks (on 09/24/2021).  Future Appointments  Date Time Provider [place]  09/12/2021 10:15 AM [facility] NURSE [facility]  09/12/2021 10:30 AM [facility] US3 [facility]  09/13/2021 11:00 AM [facility] None  09/24/2021  3:50 PM Sloan Leiter, MD [facility]    Donnamae Jude, MD

## 2021-09-11 ENCOUNTER — Encounter: Payer: Self-pay | Admitting: *Deleted

## 2021-09-11 LAB — T3, FREE: T3, Free: 11.3 pg/mL — ABNORMAL HIGH (ref 2.3–4.2)

## 2021-09-11 LAB — T4, FREE: Free T4: 2.7 ng/dL — ABNORMAL HIGH (ref 0.8–1.8)

## 2021-09-11 LAB — TSH: TSH: <0.01 mIU/L — ABNORMAL LOW

## 2021-09-12 ENCOUNTER — Other Ambulatory Visit: Payer: Self-pay

## 2021-09-12 ENCOUNTER — Other Ambulatory Visit: Payer: Self-pay | Admitting: *Deleted

## 2021-09-12 ENCOUNTER — Encounter: Payer: Self-pay | Admitting: *Deleted

## 2021-09-12 ENCOUNTER — Ambulatory Visit: Payer: Medicaid Other | Attending: Maternal & Fetal Medicine

## 2021-09-12 ENCOUNTER — Ambulatory Visit (HOSPITAL_BASED_OUTPATIENT_CLINIC_OR_DEPARTMENT_OTHER): Payer: Medicaid Other | Admitting: Obstetrics

## 2021-09-12 ENCOUNTER — Ambulatory Visit: Payer: Medicaid Other | Admitting: *Deleted

## 2021-09-12 VITALS — BP 119/62 | HR 100

## 2021-09-12 DIAGNOSIS — O219 Vomiting of pregnancy, unspecified: Secondary | ICD-10-CM | POA: Insufficient documentation

## 2021-09-12 DIAGNOSIS — Z3689 Encounter for other specified antenatal screening: Secondary | ICD-10-CM | POA: Diagnosis not present

## 2021-09-12 DIAGNOSIS — Z3A18 18 weeks gestation of pregnancy: Secondary | ICD-10-CM | POA: Diagnosis not present

## 2021-09-12 DIAGNOSIS — O99282 Endocrine, nutritional and metabolic diseases complicating pregnancy, second trimester: Secondary | ICD-10-CM

## 2021-09-12 DIAGNOSIS — Z6832 Body mass index (BMI) 32.0-32.9, adult: Secondary | ICD-10-CM

## 2021-09-12 DIAGNOSIS — O99280 Endocrine, nutritional and metabolic diseases complicating pregnancy, unspecified trimester: Secondary | ICD-10-CM | POA: Diagnosis present

## 2021-09-12 DIAGNOSIS — Z362 Encounter for other antenatal screening follow-up: Secondary | ICD-10-CM

## 2021-09-12 DIAGNOSIS — E0590 Thyrotoxicosis, unspecified without thyrotoxic crisis or storm: Secondary | ICD-10-CM

## 2021-09-12 DIAGNOSIS — O99212 Obesity complicating pregnancy, second trimester: Secondary | ICD-10-CM | POA: Insufficient documentation

## 2021-09-12 DIAGNOSIS — O10912 Unspecified pre-existing hypertension complicating pregnancy, second trimester: Secondary | ICD-10-CM | POA: Insufficient documentation

## 2021-09-12 DIAGNOSIS — O10012 Pre-existing essential hypertension complicating pregnancy, second trimester: Secondary | ICD-10-CM

## 2021-09-12 DIAGNOSIS — O0992 Supervision of high risk pregnancy, unspecified, second trimester: Secondary | ICD-10-CM | POA: Diagnosis present

## 2021-09-12 NOTE — Progress Notes (Signed)
MFM Note  April Benson was seen for a detailed fetal anatomy scan due to hyperthyroidism, maternal obesity with a BMI of 38, and chronic hypertension.  She is currently treated with methimazole 5 mg twice a day and Toprol for blood pressure control and for palpitations.    Her thyroid function tests drawn yesterday showed increased free T4 and free T3 levels along with a TSH level that was almost undetectable (less than 0.01).  The patient reports that she has an appointment with her endocrinologist within the next week.  She had a cell free DNA test earlier in her pregnancy which indicated a low risk for trisomy 57, 14, and 13. A female fetus is predicted.   She was informed that the fetal growth and amniotic fluid level were appropriate for her gestational age.   There were no obvious fetal anomalies noted on today's ultrasound exam.  However, the views of the fetal anatomy were limited today due to the fetal position.  The patient was informed that anomalies may be missed due to technical limitations. If the fetus is in a suboptimal position or maternal habitus is increased, visualization of the fetus in the maternal uterus may be impaired.  The following were discussed during today's consultation:  Hyperthyroidism and pregnancy  The implications and management of hyperthyroidism in pregnancy was discussed with the patient.    She was advised that uncontrolled hyperthyroidism is associated with significant adverse pregnancy outcomes in the mother such as the development of preeclampsia and thyroid storm.    In the fetus, uncontrolled hyperthyroidism may result in fetal tachycardia, fetal growth restriction, and stillbirths.    The patient was advised to consult with her endocrinologist to determine the most optimal dosage of methimazole to normalize her thyroid function.  She was advised to continue taking methimazole as recommended by her endocrinologist for the duration of pregnancy.     She should have her thyroid function tests monitored once every month to ensure that she is being adequately treated for hyperthyroidism.    Weekly fetal testing should be started at around 32 weeks.    The patient was advised that maintaining normal thyroid function throughout her pregnancy will provide her with the most optimal obstetrical outcomes.  We will continue to follow her closely with monthly growth ultrasounds.  A follow-up exam was scheduled in 4 weeks to assess the fetal growth and to reassess the fetal anatomy.   The patient stated that all of her questions were answered today.  A total of 30 minutes was spent counseling and coordinating the care for this patient.  Greater than 50% of the time was spent in direct face-to-face contact.

## 2021-09-13 ENCOUNTER — Encounter (HOSPITAL_COMMUNITY)
Admission: RE | Admit: 2021-09-13 | Discharge: 2021-09-13 | Disposition: A | Discharge status: Home / Self Care | Payer: Medicaid Other | Source: Ambulatory Visit | Attending: Obstetrics & Gynecology | Admitting: Obstetrics & Gynecology

## 2021-09-13 DIAGNOSIS — O210 Mild hyperemesis gravidarum: Secondary | ICD-10-CM | POA: Diagnosis not present

## 2021-09-13 MED ORDER — THIAMINE HCL 100 MG/ML IJ SOLN
INTRAVENOUS | Status: DC
  Filled 2021-09-13: qty 1000

## 2021-09-17 ENCOUNTER — Encounter (HOSPITAL_COMMUNITY)
Admission: RE | Admit: 2021-09-17 | Discharge: 2021-09-17 | Disposition: A | Discharge status: Home / Self Care | Payer: Medicaid Other | Source: Ambulatory Visit | Attending: Obstetrics & Gynecology | Admitting: Obstetrics & Gynecology

## 2021-09-17 ENCOUNTER — Other Ambulatory Visit: Payer: Self-pay

## 2021-09-17 DIAGNOSIS — O210 Mild hyperemesis gravidarum: Secondary | ICD-10-CM | POA: Diagnosis not present

## 2021-09-17 MED ORDER — THIAMINE HCL 100 MG/ML IJ SOLN
INTRAVENOUS | Status: DC
  Filled 2021-09-17: qty 1000

## 2021-09-20 ENCOUNTER — Encounter (HOSPITAL_COMMUNITY)
Admission: RE | Admit: 2021-09-20 | Discharge: 2021-09-20 | Disposition: A | Discharge status: Home / Self Care | Payer: Medicaid Other | Source: Ambulatory Visit | Attending: Obstetrics & Gynecology | Admitting: Obstetrics & Gynecology

## 2021-09-20 DIAGNOSIS — Z3A00 Weeks of gestation of pregnancy not specified: Secondary | ICD-10-CM | POA: Diagnosis not present

## 2021-09-20 DIAGNOSIS — O210 Mild hyperemesis gravidarum: Secondary | ICD-10-CM | POA: Insufficient documentation

## 2021-09-20 MED ORDER — THIAMINE HCL 100 MG/ML IJ SOLN
INTRAVENOUS | Status: DC
  Filled 2021-09-20: qty 1000

## 2021-09-24 ENCOUNTER — Ambulatory Visit (INDEPENDENT_AMBULATORY_CARE_PROVIDER_SITE_OTHER): Payer: Medicaid Other | Admitting: Obstetrics and Gynecology

## 2021-09-24 ENCOUNTER — Other Ambulatory Visit: Payer: Self-pay

## 2021-09-24 ENCOUNTER — Encounter (HOSPITAL_COMMUNITY)
Admission: RE | Admit: 2021-09-24 | Discharge: 2021-09-24 | Disposition: A | Discharge status: Home / Self Care | Payer: Medicaid Other | Source: Ambulatory Visit | Attending: Obstetrics & Gynecology | Admitting: Obstetrics & Gynecology

## 2021-09-24 VITALS — BP 123/69 | HR 110 | Wt 186.0 lb

## 2021-09-24 DIAGNOSIS — E0590 Thyrotoxicosis, unspecified without thyrotoxic crisis or storm: Secondary | ICD-10-CM

## 2021-09-24 DIAGNOSIS — O99280 Endocrine, nutritional and metabolic diseases complicating pregnancy, unspecified trimester: Secondary | ICD-10-CM | POA: Diagnosis not present

## 2021-09-24 DIAGNOSIS — O0992 Supervision of high risk pregnancy, unspecified, second trimester: Secondary | ICD-10-CM

## 2021-09-24 DIAGNOSIS — Z3A00 Weeks of gestation of pregnancy not specified: Secondary | ICD-10-CM | POA: Diagnosis not present

## 2021-09-24 DIAGNOSIS — O99282 Endocrine, nutritional and metabolic diseases complicating pregnancy, second trimester: Secondary | ICD-10-CM | POA: Insufficient documentation

## 2021-09-24 DIAGNOSIS — Z3A20 20 weeks gestation of pregnancy: Secondary | ICD-10-CM

## 2021-09-24 DIAGNOSIS — O210 Mild hyperemesis gravidarum: Secondary | ICD-10-CM | POA: Insufficient documentation

## 2021-09-24 MED ORDER — THIAMINE HCL 100 MG/ML IJ SOLN
INTRAVENOUS | Status: DC
  Filled 2021-09-24: qty 1000

## 2021-09-24 NOTE — Progress Notes (Signed)
? ?  PRENATAL VISIT NOTE ? ?Subjective:  ?April Benson is a 26 y.o. G1P0 at [redacted]w[redacted]d being seen today for ongoing prenatal care.  She is currently monitored for the following issues for this high-risk pregnancy and has Supervision of high-risk pregnancy; Hyperthyroidism in pregnancy, antepartum; and Nausea/vomiting in pregnancy on their problem list. ? ?Patient reports  has gained 13 pounds back, eating now, still nauseous but able to keep food and liquids down now .  Contractions: Not present. Vag. Bleeding: None.  Movement: Present. Denies leaking of fluid.  ? ?The following portions of the patient's history were reviewed and updated as appropriate: allergies, current medications, past family history, past medical history, past social history, past surgical history and problem list.  ? ?Objective:  ? ?Vitals:  ? 09/24/21 1557  ?BP: 123/69  ?Pulse: (!) 110  ?Weight: 186 lb (84.4 kg)  ? ? ?Fetal Status: Fetal Heart Rate (bpm): 143   Movement: Present    ? ?General:  Alert, oriented and cooperative. Patient is in no acute distress.  ?Skin: Skin is warm and dry. No rash noted.   ?Cardiovascular: Normal heart rate noted  ?Respiratory: Normal respiratory effort, no problems with respiration noted  ?Abdomen: Soft, gravid, appropriate for gestational age.  Pain/Pressure: Absent     ?Pelvic: Cervical exam deferred        ?Extremities: Normal range of motion.  Edema: None  ?Mental Status: Normal mood and affect. Normal behavior. Normal judgment and thought content.  ? ?Assessment and Plan:  ?Pregnancy: G1P0 at [redacted]w[redacted]d ?1. Hyperthyroidism in pregnancy, antepartum ?On methimazole 10 mg BID per endo ?Cont toprol ?Follows with endo at [facility], next blood work in ~ 4weeks ? ?2. Supervision of high risk pregnancy in second trimester ?- Will stop IV infusions for now, she is tolerating food most of the time ?- to call if she is unable to tolerate ? ?3. [redacted] weeks gestation of pregnancy ? ? ?Preterm labor symptoms and general obstetric  precautions including but not limited to vaginal bleeding, contractions, leaking of fluid and fetal movement were reviewed in detail with the patient. ?Please refer to After Visit Summary for other counseling recommendations.  ? ?Return in about 4 weeks (around 10/22/2021) for high OB. ? ?Future Appointments  ?Date Time Provider [place]  ?10/11/2021  8:30 AM [facility] NURSE [facility]  ?10/11/2021  8:45 AM [facility] US6 [facility]  ?10/22/2021 10:30 AM Anyanwu, Sallyanne Havers, MD [facility]  ? ? ?Sloan Leiter, MD ? ?

## 2021-09-25 LAB — ALPHA FETOPROTEIN, MATERNAL
AFP MoM: 0.72
AFP, Serum: 38.6 ng/mL
Calc'd Gestational Age: 20.4 weeks
Maternal Wt: 186 [lb_av]
Risk for ONTD: <1
Twins-AFP: 1

## 2021-10-11 ENCOUNTER — Other Ambulatory Visit: Payer: Self-pay | Admitting: *Deleted

## 2021-10-11 ENCOUNTER — Other Ambulatory Visit: Payer: Self-pay

## 2021-10-11 ENCOUNTER — Encounter: Payer: Self-pay | Admitting: *Deleted

## 2021-10-11 ENCOUNTER — Ambulatory Visit: Payer: Medicaid Other | Attending: Obstetrics

## 2021-10-11 ENCOUNTER — Ambulatory Visit: Payer: Medicaid Other | Admitting: *Deleted

## 2021-10-11 VITALS — BP 125/65 | HR 101

## 2021-10-11 DIAGNOSIS — O219 Vomiting of pregnancy, unspecified: Secondary | ICD-10-CM | POA: Diagnosis present

## 2021-10-11 DIAGNOSIS — O99280 Endocrine, nutritional and metabolic diseases complicating pregnancy, unspecified trimester: Secondary | ICD-10-CM | POA: Insufficient documentation

## 2021-10-11 DIAGNOSIS — Z362 Encounter for other antenatal screening follow-up: Secondary | ICD-10-CM

## 2021-10-11 DIAGNOSIS — O99282 Endocrine, nutritional and metabolic diseases complicating pregnancy, second trimester: Secondary | ICD-10-CM | POA: Diagnosis present

## 2021-10-11 DIAGNOSIS — E0590 Thyrotoxicosis, unspecified without thyrotoxic crisis or storm: Secondary | ICD-10-CM

## 2021-10-11 DIAGNOSIS — Z6838 Body mass index (BMI) 38.0-38.9, adult: Secondary | ICD-10-CM

## 2021-10-11 DIAGNOSIS — O10912 Unspecified pre-existing hypertension complicating pregnancy, second trimester: Secondary | ICD-10-CM

## 2021-10-11 DIAGNOSIS — O0992 Supervision of high risk pregnancy, unspecified, second trimester: Secondary | ICD-10-CM

## 2021-10-11 DIAGNOSIS — Z3A22 22 weeks gestation of pregnancy: Secondary | ICD-10-CM

## 2021-10-11 DIAGNOSIS — Z6832 Body mass index (BMI) 32.0-32.9, adult: Secondary | ICD-10-CM | POA: Diagnosis present

## 2021-10-22 ENCOUNTER — Ambulatory Visit (INDEPENDENT_AMBULATORY_CARE_PROVIDER_SITE_OTHER): Payer: Medicaid Other | Admitting: Obstetrics & Gynecology

## 2021-10-22 VITALS — BP 126/79 | HR 109 | Wt 195.0 lb

## 2021-10-22 DIAGNOSIS — O0992 Supervision of high risk pregnancy, unspecified, second trimester: Secondary | ICD-10-CM

## 2021-10-22 DIAGNOSIS — O99280 Endocrine, nutritional and metabolic diseases complicating pregnancy, unspecified trimester: Secondary | ICD-10-CM

## 2021-10-22 DIAGNOSIS — M549 Dorsalgia, unspecified: Secondary | ICD-10-CM

## 2021-10-22 DIAGNOSIS — O99210 Obesity complicating pregnancy, unspecified trimester: Secondary | ICD-10-CM

## 2021-10-22 DIAGNOSIS — O99891 Other specified diseases and conditions complicating pregnancy: Secondary | ICD-10-CM

## 2021-10-22 DIAGNOSIS — E0590 Thyrotoxicosis, unspecified without thyrotoxic crisis or storm: Secondary | ICD-10-CM

## 2021-10-22 DIAGNOSIS — Z3A24 24 weeks gestation of pregnancy: Secondary | ICD-10-CM

## 2021-10-22 MED ORDER — ASPIRIN EC 81 MG PO TBEC: 81.0000 mg | DELAYED_RELEASE_TABLET | Freq: Every day | ORAL | 2 refills | Status: DC

## 2021-10-22 MED ORDER — CYCLOBENZAPRINE HCL 10 MG PO TABS: 10.0000 mg | ORAL_TABLET | Freq: Three times a day (TID) | ORAL | 2 refills | Status: DC | PRN

## 2021-10-22 NOTE — Patient Instructions (Signed)
TDaP Vaccine Pregnancy Get the Whooping Cough Vaccine While You Are Pregnant (CDC)  It is important for women to get the whooping cough vaccine in the third trimester of each pregnancy. Vaccines are the best way to prevent this disease. There are 2 different whooping cough vaccines. Both vaccines combine protection against whooping cough, tetanus and diphtheria, but they are for different age groups: Tdap: for everyone 11 years or older, including pregnant women  DTaP: for children 2 months through 6 years of age  You need the whooping cough vaccine during each of your pregnancies The recommended time to get the shot is during your 27th through 36th week of pregnancy, preferably during the earlier part of this time period. The Centers for Disease Control and Prevention (CDC) recommends that pregnant women receive the whooping cough vaccine for adolescents and adults (called Tdap vaccine) during the third trimester of each pregnancy. The recommended time to get the shot is during your 27th through 36th week of pregnancy, preferably during the earlier part of this time period. This replaces the original recommendation that pregnant women get the vaccine only if they had not previously received it. The American College of Obstetricians and Gynecologists and the American College of Nurse-Midwives support this recommendation.  You should get the whooping cough vaccine while pregnant to pass protection to your baby frame support disabled and/or not supported in this browser  Learn why April Benson decided to get the whooping cough vaccine in her 3rd trimester of pregnancy and how her baby girl was born with some protection against the disease. Also available on YouTube. After receiving the whooping cough vaccine, your body will create protective antibodies (proteins produced by the body to fight off diseases) and pass some of them to your baby before birth. These antibodies provide your baby some short-term  protection against whooping cough in early life. These antibodies can also protect your baby from some of the more serious complications that come along with whooping cough. Your protective antibodies are at their highest about 2 weeks after getting the vaccine, but it takes time to pass them to your baby. So the preferred time to get the whooping cough vaccine is early in your third trimester. The amount of whooping cough antibodies in your body decreases over time. That is why CDC recommends you get a whooping cough vaccine during each pregnancy. Doing so allows each of your babies to get the greatest number of protective antibodies from you. This means each of your babies will get the best protection possible against this disease.  Getting the whooping cough vaccine while pregnant is better than getting the vaccine after you give birth Whooping cough vaccination during pregnancy is ideal so your baby will have short-term protection as soon as he is born. This early protection is important because your baby will not start getting his whooping cough vaccines until he is 2 months old. These first few months of life are when your baby is at greatest risk for catching whooping cough. This is also when he's at greatest risk for having severe, potentially life-threating complications from the infection. To avoid that gap in protection, it is best to get a whooping cough vaccine during pregnancy. You will then pass protection to your baby before he is born. To continue protecting your baby, he should get whooping cough vaccines starting at 2 months old. You may never have gotten the Tdap vaccine before and did not get it during this pregnancy. If so, you should make sure   to get the vaccine immediately after you give birth, before leaving the hospital or birthing center. It will take about 2 weeks before your body develops protection (antibodies) in response to the vaccine. Once you have protection from the vaccine,  you are less likely to give whooping cough to your newborn while caring for him. But remember, your baby will still be at risk for catching whooping cough from others. A recent study looked to see how effective Tdap was at preventing whooping cough in babies whose mothers got the vaccine while pregnant or in the hospital after giving birth. The study found that getting Tdap between 27 through 36 weeks of pregnancy is 85% more effective at preventing whooping cough in babies younger than 2 months old. Blood tests cannot tell if you need a whooping cough vaccine There are no blood tests that can tell you if you have enough antibodies in your body to protect yourself or your baby against whooping cough. Even if you have been sick with whooping cough in the past or previously received the vaccine, you still should get the vaccine during each pregnancy. Breastfeeding may pass some protective antibodies onto your baby By breastfeeding, you may pass some antibodies you have made in response to the vaccine to your baby. When you get a whooping cough vaccine during your pregnancy, you will have antibodies in your breast milk that you can share with your baby as soon as your milk comes in. However, your baby will not get protective antibodies immediately if you wait to get the whooping cough vaccine until after delivering your baby. This is because it takes about 2 weeks for your body to create antibodies. Learn more about the health benefits of breastfeeding.  

## 2021-10-22 NOTE — Progress Notes (Signed)
? ?PRENATAL VISIT NOTE ? ?Subjective:  ?April Benson is a 26 y.o. G1P0 at [redacted]w[redacted]d being seen today for ongoing prenatal care.  She is currently monitored for the following issues for this high-risk pregnancy and has Supervision of high-risk pregnancy; Hyperthyroidism in pregnancy, antepartum; and Nausea/vomiting in pregnancy on their problem list. ? ?Patient reports backache and hip pain, Tylenol not enough.  Contractions: Not present. Vag. Bleeding: None.  Movement: Present. Denies leaking of fluid.  ? ?The following portions of the patient's history were reviewed and updated as appropriate: allergies, current medications, past family history, past medical history, past social history, past surgical history and problem list.  ? ?Objective:  ? ?Vitals:  ? 10/22/21 1041  ?BP: 126/79  ?Pulse: (!) 109  ?Weight: 195 lb (88.5 kg)  ? ? ?Fetal Status: Fetal Heart Rate (bpm): 147   Movement: Present    ? ?General:  Alert, oriented and cooperative. Patient is in no acute distress.  ?Skin: Skin is warm and dry. No rash noted.   ?Cardiovascular: Normal heart rate noted  ?Respiratory: Normal respiratory effort, no problems with respiration noted  ?Abdomen: Soft, gravid, appropriate for gestational age.  Pain/Pressure: Absent     ?Pelvic: Cervical exam deferred        ?Extremities: Normal range of motion.  Edema: Trace  ?Mental Status: Normal mood and affect. Normal behavior. Normal judgment and thought content.  ? ?[place] MFM OB FOLLOW UP ? ?Result Date: 10/11/2021 ?----------------------------------------------------------------------  OBSTETRICS REPORT                       (Signed Final 10/11/2021 12:13 pm) ---------------------------------------------------------------------- Patient Info  ID #:       [id number]                          D.O.B.:  January 09, 1996 (26 yrs)  Name:       April Benson [facility]                  Visit Date: 10/11/2021 08:18 am ---------------------------------------------------------------------- Performed By   Attending:        Noralee Space MD        Ref. Address:     [address]                                                             [place], [place]                                                             [address]  Performed By:     Tommie Raymond BS,       [facility]  Referred By:      Raelyn Mora CNM ---------------------------------------------------------------------- Orders  #  Description                           Code        Ordered By  1  [place] MFM OB FOLLOW UP                   [phone]    YU FANG ----------------------------------------------------------------------  #  Order #                     Accession #                Episode #  1  [id number]                   [id number]                 [id number] ---------------------------------------------------------------------- Indications  [redacted] weeks gestation of pregnancy                Z3A.22  Obesity complicating pregnancy, second         O99.212  trimester (BMI 38)  Hyperthyroid (Methimazole and metoprolol)      O99.280 E05.90  Hypertension - Chronic/Pre-existing (no        O10.019  meds)  Hyperemesis gravidarum                         O21.0  LR NIPS (per patient)  Encounter for other antenatal screening        Z36.2  follow-up ---------------------------------------------------------------------- Fetal Evaluation  Num Of Fetuses:         1  Fetal Heart Rate(bpm):  157  Cardiac Activity:       Observed  Presentation:           Cephalic  Placenta:               Anterior  P. Cord Insertion:      Previously Visualized  Amniotic Fluid  AFI FV:      Within normal limits  Largest Pocket(cm)                               6.7 ---------------------------------------------------------------------- Biometry  BPD:      55.3  mm     G. Age:  22w 6d         46  %    CI:        76.04   %    70 - 86                                                          FL/HC:      19.1   %    19.2 - 20.8  HC:       201   mm     G. Age:  22w 2d         15  %    HC/AC:      1.07        1.05 - 1.21  AC:       188   mm     G. Age:  23w 4d         65  %    FL/BPD:     69.4   %    71 - 87  FL:       38.4  mm     G. Age:  22w 2d         22  %    FL/AC:      20.4   %    20 - 24  CER:      24.2  mm     G. Age:  22w 2d         58  %  LV:        6.6  mm  CM:        6.9  mm  Est. FW:     547  gm      1 lb 3 oz     46  % ---------------------------------------------------------------------- OB History  Gravidity:    1  Living:       0 ---------------------------------------------------------------------- Gestational Age  LMP:           22w 6d        Date:  05/04/21                 EDD:   02/08/22  U/S Today:     22w 5d                                        EDD:   02/09/22  Best:          22w 6d     Det. By:  LMP  (05/04/21)          EDD:   02/08/22 ---------------------------------------------------------------------- Anatomy  Cranium:               Appears normal         LVOT:                   Appears normal  Cavum:  Appears normal         Aortic Arch:            Appears normal  Ventricles:            Appears normal         Ductal Arch:            Previously seen  Choroid Plexus:        Appears normal         Diaphragm:              Appears normal  Cerebellum:            Appears normal         Stomach:                Appears normal, left                                                                        sided  Posterior Fossa:       Appears normal         Abdomen:                Appears normal  Nuchal Fold:           Previously seen        Abdominal Wall:         Appears nml (cord                                                                         insert, abd wall)  Face:                  Appears normal         Cord Vessels:           Appears normal (3                         (orbits and profile)                           vessel cord)  Lips:                  Appears normal         Kidneys:                Appear normal  Palate:                Not well visualized    Bladder:                Appears normal  Thoracic:              Appears normal         Spine:                  Appears normal  Heart:  Appears normal         Upper Extremities:      Previously seen                         (4CH, axis, and                         situs)  RVOT:                  Appears normal         Lower Extremities:      Previously seen  Other:  Fetus appears to be female. Nasal Bone, Lenses, VC, 3VV and 3VTV          visualized. Technically difficult due to maternal habitus and fetal          position. ---------------------------------------------------------------------- Cervix Uterus Adnexa  Cervix  Length:              4  cm.  Normal appearance by transabdominal scan.  Uterus  No abnormality visualized.  Right Ovary  Within normal limits.  Left Ovary  Within normal limits.  Cul De Sac  No free fluid seen.  Adnexa  No abnormality visualized. ---------------------------------------------------------------------- Impression  Maternal hyperthyroidism with exophthalmos. Patient takes  methimazole 10 mg twice daily and metoprolol-XL 25 mg  daily.  Her recent TFTs (09/10/21) were:  -TSH <0.01 (low)  -Free T3 11.3 (increased)  -Free T4: 2.7 (increased)  Blood pressure today at her office was 125/65 mmHg.  Pulse  101/minute.  On today's ultrasound, amniotic fluid is normal and good fetal  activity seen.  Fetal growth is appropriate for gestational age.  Fetal anatomical survey was completed and appears normal.  Fetal heart rate and rhythm are normal.  Fetal neck appears  normal with no evidence of goiter.  I reassured the patient of the findings.  Free thyroid hormone  estimation can be inaccurate because  of the low levels (especially T3).  Total thyroid hormones (TT4  and TT3) may be more reliable in pregnancy. ----------------------------------------------------------

## 2021-10-26 ENCOUNTER — Inpatient Hospital Stay (HOSPITAL_COMMUNITY)
Admission: AD | Admit: 2021-10-26 | Discharge: 2021-10-27 | Disposition: A | Discharge status: Home / Self Care | Payer: Medicaid Other | Attending: Obstetrics & Gynecology | Admitting: Obstetrics & Gynecology

## 2021-10-26 ENCOUNTER — Other Ambulatory Visit: Payer: Self-pay

## 2021-10-26 ENCOUNTER — Encounter (HOSPITAL_COMMUNITY): Payer: Self-pay | Admitting: Obstetrics & Gynecology

## 2021-10-26 DIAGNOSIS — R519 Headache, unspecified: Secondary | ICD-10-CM | POA: Insufficient documentation

## 2021-10-26 DIAGNOSIS — Z79899 Other long term (current) drug therapy: Secondary | ICD-10-CM | POA: Insufficient documentation

## 2021-10-26 DIAGNOSIS — R1033 Periumbilical pain: Secondary | ICD-10-CM | POA: Diagnosis not present

## 2021-10-26 DIAGNOSIS — O26892 Other specified pregnancy related conditions, second trimester: Secondary | ICD-10-CM

## 2021-10-26 DIAGNOSIS — R030 Elevated blood-pressure reading, without diagnosis of hypertension: Secondary | ICD-10-CM | POA: Insufficient documentation

## 2021-10-26 DIAGNOSIS — Z3A25 25 weeks gestation of pregnancy: Secondary | ICD-10-CM

## 2021-10-26 NOTE — MAU Note (Signed)
.  April Benson is a 26 y.o. at [redacted]w[redacted]d here in MAU reporting: c/o headache that started a few hours ago took tylenol and it did not help took nightly baby Asprin and that did not ?help either. Started having sharp pains in her mid abd that come ane go and feels "shaky and hot" Good fetal movement felt. Denies any vag bleeding or discharge.  ?Onset of complaint: 5pm ?Pain score: 7 ?Vitals:  ? 10/26/21 2339  ?BP: (!) 156/96  ?Pulse: (!) 140  ?Resp: 18  ?Temp: 98.7 ?F (37.1 ?C)  ?   ?FHT:152 ?Lab orders placed from triage:  u/a ? ?

## 2021-10-27 ENCOUNTER — Inpatient Hospital Stay (HOSPITAL_COMMUNITY): Payer: Medicaid Other

## 2021-10-27 DIAGNOSIS — R519 Headache, unspecified: Secondary | ICD-10-CM

## 2021-10-27 DIAGNOSIS — O26892 Other specified pregnancy related conditions, second trimester: Secondary | ICD-10-CM

## 2021-10-27 DIAGNOSIS — Z3A25 25 weeks gestation of pregnancy: Secondary | ICD-10-CM

## 2021-10-27 LAB — URINALYSIS, ROUTINE W REFLEX MICROSCOPIC
Bilirubin Urine: NEGATIVE
Glucose, UA: NEGATIVE mg/dL
Hgb urine dipstick: NEGATIVE
Ketones, ur: NEGATIVE mg/dL
Nitrite: NEGATIVE
Protein, ur: NEGATIVE mg/dL
Specific Gravity, Urine: 1.013 (ref 1.005–1.030)
pH: 7 (ref 5.0–8.0)

## 2021-10-27 LAB — COMPREHENSIVE METABOLIC PANEL
ALT: 15 U/L (ref 0–44)
AST: 14 U/L — ABNORMAL LOW (ref 15–41)
Albumin: 3.1 g/dL — ABNORMAL LOW (ref 3.5–5.0)
Alkaline Phosphatase: 69 U/L (ref 38–126)
Anion gap: 7 (ref 5–15)
BUN: 6 mg/dL (ref 6–20)
CO2: 21 mmol/L — ABNORMAL LOW (ref 22–32)
Calcium: 9.1 mg/dL (ref 8.9–10.3)
Chloride: 109 mmol/L (ref 98–111)
Creatinine, Ser: 0.37 mg/dL — ABNORMAL LOW (ref 0.44–1.00)
GFR, Estimated: >60 mL/min (ref >60)
Glucose, Bld: 117 mg/dL — ABNORMAL HIGH (ref 70–99)
Potassium: 3.7 mmol/L (ref 3.5–5.1)
Sodium: 137 mmol/L (ref 135–145)
Total Bilirubin: 0.2 mg/dL — ABNORMAL LOW (ref 0.3–1.2)
Total Protein: 6.5 g/dL (ref 6.5–8.1)

## 2021-10-27 LAB — CBC
HCT: 34.8 % — ABNORMAL LOW (ref 36.0–46.0)
Hemoglobin: 11.6 g/dL — ABNORMAL LOW (ref 12.0–15.0)
MCH: 28.4 pg (ref 26.0–34.0)
MCHC: 33.3 g/dL (ref 30.0–36.0)
MCV: 85.3 fL (ref 80.0–100.0)
Platelets: 256 10*3/uL (ref 150–400)
RBC: 4.08 MIL/uL (ref 3.87–5.11)
RDW: 13.5 % (ref 11.5–15.5)
WBC: 9.4 10*3/uL (ref 4.0–10.5)
nRBC: 0 % (ref 0.0–0.2)

## 2021-10-27 LAB — PROTEIN / CREATININE RATIO, URINE
Creatinine, Urine: 47.64 mg/dL
Protein Creatinine Ratio: 0.19 mg/mg{Cre} — ABNORMAL HIGH (ref 0.00–0.15)
Total Protein, Urine: 9 mg/dL

## 2021-10-27 MED ORDER — SODIUM CHLORIDE 0.9 % IV SOLN
25.0000 mg | Freq: Once | INTRAVENOUS | Status: AC
  Administered 2021-10-27: 25 mg via INTRAVENOUS
  Filled 2021-10-27: qty 1

## 2021-10-27 MED ORDER — PROCHLORPERAZINE EDISYLATE 10 MG/2ML IJ SOLN
10.0000 mg | Freq: Four times a day (QID) | INTRAMUSCULAR | Status: DC | PRN
  Administered 2021-10-27: 10 mg via INTRAVENOUS
  Filled 2021-10-27: qty 2

## 2021-10-27 MED ORDER — LACTATED RINGERS IV SOLN: Freq: Once | INTRAVENOUS | Status: AC

## 2021-10-27 MED ORDER — MAGNESIUM SULFATE 2 GM/50ML IV SOLN
2.0000 g | Freq: Once | INTRAVENOUS | Status: AC
  Administered 2021-10-27: 2 g via INTRAVENOUS
  Filled 2021-10-27: qty 50

## 2021-10-27 MED ORDER — CYCLOBENZAPRINE HCL 5 MG PO TABS
10.0000 mg | ORAL_TABLET | Freq: Once | ORAL | Status: AC
Start: 2021-10-27 — End: 2021-10-27
  Administered 2021-10-27: 10 mg via ORAL
  Filled 2021-10-27: qty 2

## 2021-10-27 NOTE — Progress Notes (Signed)
Verbal and written discharge instructions given to pt; pt verbalized understanding.  ?

## 2021-10-27 NOTE — MAU Provider Note (Signed)
?History  ?  ? ?CSN: [id number] ? ?Arrival date and time: 10/26/21 2325 ? ? Event Date/Time  ? First Provider Initiated Contact with Patient 10/27/21 0006   ?  ? ?Chief Complaint  ?Patient presents with  ? Headache  ? ?HPI ?April Benson is a 26 y.o. G1P0 at [redacted]w[redacted]d who presents to MAU with chief complaint of persistent headache and concern for Preeclampsia. Patient's headache began in the past 3 hours. She attempted management with Tylenol but did not experience relief. Patient endorses history of headaches but states this "Feels different from normal". Pain score on arrival to MAU is 7/10. She denies spots in her field of vision, RUQ/epigastric pain, new onset swelling. ? ?Patient also c/o "sharp" periumbilical pain, new onset. Pain waxes and wanes. She denies abdominal tenderness, vaginal bleeding, DFM, dysuria.  ? ?Patient receives care with [facility]. ? ?OB History   ? ? Gravida  ?1  ? Para  ?   ? Term  ?   ? Preterm  ?   ? AB  ?   ? Living  ?   ?  ? ? SAB  ?   ? IAB  ?   ? Ectopic  ?   ? Multiple  ?   ? Live Births  ?   ?   ?  ?  ? ? ?Past Medical History:  ?Diagnosis Date  ? Hypertension   ? Hyperthyroidism   ? Tachycardia   ? ? ?Past Surgical History:  ?Procedure Laterality Date  ? NO PAST SURGERIES    ? ? ?Family History  ?Problem Relation Age of Onset  ? Diabetes Mother   ? Hypothyroidism Mother   ? Graves' disease Mother   ? ? ?Social History  ? ?Tobacco Use  ? Smoking status: Never  ? Smokeless tobacco: Never  ?Vaping Use  ? Vaping Use: Never used  ?Substance Use Topics  ? Alcohol use: Not Currently  ? Drug use: Never  ? ? ?Allergies: No Known Allergies ? ?Medications Prior to Admission  ?Medication Sig Dispense Refill Last Dose  ? aspirin EC 81 MG tablet Take 1 tablet (81 mg total) by mouth daily. Take after 12 weeks for prevention of preeclampsia later in pregnancy 300 tablet 2 10/26/2021  ? cyclobenzaprine (FLEXERIL) 10 MG tablet Take 1 tablet (10 mg total) by mouth 3 (three) times daily as needed for muscle  spasms. 30 tablet 2 10/25/2021  ? famotidine (PEPCID) 20 MG tablet Take 1 tablet (20 mg total) by mouth 2 (two) times daily. 30 tablet 0 10/25/2021  ? methimazole (TAPAZOLE) 5 MG tablet Take 1 tablet (5 mg total) by mouth 2 (two) times daily. (Patient taking differently: Take 10 mg by mouth 2 (two) times daily.) 60 tablet 0 10/26/2021  ? metoprolol succinate (TOPROL-XL) 25 MG 24 hr tablet Take 1 tablet (25 mg total) by mouth daily. 14 tablet 0 10/26/2021  ? Prenatal Vit-Fe Fumarate-FA (PRENATAL MULTIVITAMIN) TABS tablet Take 1 tablet by mouth daily at 12 noon.   10/26/2021  ? ondansetron (ZOFRAN-ODT) 8 MG disintegrating tablet Take 1 tablet (8 mg total) by mouth every 8 (eight) hours as needed for nausea or vomiting. 90 tablet 0   ? promethazine (PHENERGAN) 25 MG suppository Place 1 suppository (25 mg total) rectally every 6 (six) hours as needed for nausea or vomiting. (Patient not taking: Reported on 10/11/2021) 12 each 0   ? scopolamine (TRANSDERM-SCOP) 1 MG/3DAYS Place 1 patch (1.5 mg total) onto the skin every 3 (three)  days. 10 patch 12   ? ? ?Review of Systems  ?Gastrointestinal:  Positive for abdominal pain.  ?Neurological:  Positive for headaches.  ?All other systems reviewed and are negative. ?Physical Exam  ? ?Blood pressure 122/64, pulse (!) 106, temperature 98.7 ?F (37.1 ?C), resp. rate 18, height 5\' 3"  (1.6 m), weight 89.4 kg, last menstrual period 05/04/2021, SpO2 99 %. ? ?Physical Exam ?Vitals and nursing note reviewed.  ?Constitutional:   ?   Appearance: She is well-developed.  ?Cardiovascular:  ?   Rate and Rhythm: Regular rhythm. Tachycardia present.  ?   Heart sounds: Normal heart sounds.  ?Pulmonary:  ?   Effort: Pulmonary effort is normal.  ?   Breath sounds: Normal breath sounds.  ?Abdominal:  ?   Comments: Gravid  ?Skin: ?   General: Skin is warm and dry.  ?   Capillary Refill: Capillary refill takes less than 2 seconds.  ?Neurological:  ?   Mental Status: She is alert.  ?Psychiatric:     ?   Mood and  Affect: Mood normal.     ?   Speech: Speech normal.     ?   Behavior: Behavior normal.  ? ? ?MAU Course  ?Procedures ? ?MDM ? ?--BP in triage very different from prenatal outpatient vitals and other vitals obtained during MAU encounter.  ? ?--0205: CNM returned to bedside. Pain score now 5-6/10, initially 7/10. Discussed alternatives for pain management including Magnesium and Compazine. Patient also offered imaging based on her report that current headache feels different than her normal migraine with aura. ? ?--0245: headache now 3/10 per report from RN. Patient no longer desires imaging but remains agreeable to Magnesium and Compazine ? ?--0335: CNM returned to bedside. HA now 1/10. Patient declines MFM OB Limited.  ? ?--Reactive tracing: baseline 140, mod var, + accels, no decels ? ?--Toco: quiet ? ?Patient Vitals for the past 24 hrs: ? BP Temp Temp src Pulse Resp SpO2 Height Weight  ?10/27/21 0340 133/68 97.7 ?F (36.5 ?C) Oral (!) 108 18 -- -- --  ?10/27/21 0100 122/64 -- -- (!) 106 -- 99 % -- --  ?10/27/21 0045 118/67 -- -- (!) 106 -- 99 % -- --  ?10/27/21 0042 124/72 -- -- (!) 118 -- -- -- --  ?10/27/21 0002 134/78 -- -- (!) 132 -- 99 % -- --  ?10/26/21 2339 (!) 156/96 98.7 ?F (37.1 ?C) -- (!) 140 18 -- 5\' 3"  (1.6 m) 89.4 kg  ? ?Results for orders placed or performed during the hospital encounter of 10/26/21 (from the past 24 hour(s))  ?Urinalysis, Routine w reflex microscopic Urine, Clean Catch     Status: Abnormal  ? Collection Time: 10/26/21 11:49 PM  ?Result Value Ref Range  ? Color, Urine YELLOW YELLOW  ? APPearance HAZY (A) CLEAR  ? Specific Gravity, Urine 1.013 1.005 - 1.030  ? pH 7.0 5.0 - 8.0  ? Glucose, UA NEGATIVE NEGATIVE mg/dL  ? Hgb urine dipstick NEGATIVE NEGATIVE  ? Bilirubin Urine NEGATIVE NEGATIVE  ? Ketones, ur NEGATIVE NEGATIVE mg/dL  ? Protein, ur NEGATIVE NEGATIVE mg/dL  ? Nitrite NEGATIVE NEGATIVE  ? Leukocytes,Ua LARGE (A) NEGATIVE  ? RBC / HPF 0-5 0 - 5 RBC/hpf  ? WBC, UA 6-10 0 - 5  WBC/hpf  ? Bacteria, UA RARE (A) NONE SEEN  ? Squamous Epithelial / LPF 21-50 0 - 5  ?Protein / creatinine ratio, urine     Status: Abnormal  ? Collection Time: 10/26/21 11:49  PM  ?Result Value Ref Range  ? Creatinine, Urine 47.64 mg/dL  ? Total Protein, Urine 9 mg/dL  ? Protein Creatinine Ratio 0.19 (H) 0.00 - 0.15 mg/mg[Cre]  ?CBC     Status: Abnormal  ? Collection Time: 10/27/21 12:34 AM  ?Result Value Ref Range  ? WBC 9.4 4.0 - 10.5 K/uL  ? RBC 4.08 3.87 - 5.11 MIL/uL  ? Hemoglobin 11.6 (L) 12.0 - 15.0 g/dL  ? HCT 34.8 (L) 36.0 - 46.0 %  ? MCV 85.3 80.0 - 100.0 fL  ? MCH 28.4 26.0 - 34.0 pg  ? MCHC 33.3 30.0 - 36.0 g/dL  ? RDW 13.5 11.5 - 15.5 %  ? Platelets 256 150 - 400 K/uL  ? nRBC 0.0 0.0 - 0.2 %  ?Comprehensive metabolic panel     Status: Abnormal  ? Collection Time: 10/27/21 12:34 AM  ?Result Value Ref Range  ? Sodium 137 135 - 145 mmol/L  ? Potassium 3.7 3.5 - 5.1 mmol/L  ? Chloride 109 98 - 111 mmol/L  ? CO2 21 (L) 22 - 32 mmol/L  ? Glucose, Bld 117 (H) 70 - 99 mg/dL  ? BUN 6 6 - 20 mg/dL  ? Creatinine, Ser 0.37 (L) 0.44 - 1.00 mg/dL  ? Calcium 9.1 8.9 - 10.3 mg/dL  ? Total Protein 6.5 6.5 - 8.1 g/dL  ? Albumin 3.1 (L) 3.5 - 5.0 g/dL  ? AST 14 (L) 15 - 41 U/L  ? ALT 15 0 - 44 U/L  ? Alkaline Phosphatase 69 38 - 126 U/L  ? Total Bilirubin 0.2 (L) 0.3 - 1.2 mg/dL  ? GFR, Estimated >60 >60 mL/min  ? Anion gap 7 5 - 15  ? ?Meds ordered this encounter  ?Medications  ? promethazine (PHENERGAN) 25 mg in sodium chloride 0.9 % 1,000 mL infusion  ? cyclobenzaprine (FLEXERIL) tablet 10 mg  ? prochlorperazine (COMPAZINE) injection 10 mg  ? magnesium sulfate IVPB 2 g 50 mL  ? lactated ringers infusion  ? ?Assessment and Plan  ?--26 y.o. G1P0 at [redacted]w[redacted]d  ?--Reactive tracing ?--Elevated BP x 1 without history of HTN ?--PEC labs WNL ?--Headache 1/10 s/p interventions in MAU ?--[facility] ?--Discharge home in stable condition ? ?Calvert CantorSamantha C  Hildred Pharo, CNM ?10/27/2021, 8:06 AM  ?

## 2021-11-09 ENCOUNTER — Ambulatory Visit: Payer: Medicaid Other | Admitting: *Deleted

## 2021-11-09 ENCOUNTER — Other Ambulatory Visit: Payer: Self-pay | Admitting: *Deleted

## 2021-11-09 ENCOUNTER — Ambulatory Visit: Payer: Medicaid Other | Attending: Obstetrics and Gynecology

## 2021-11-09 VITALS — BP 120/63 | HR 113

## 2021-11-09 DIAGNOSIS — O0992 Supervision of high risk pregnancy, unspecified, second trimester: Secondary | ICD-10-CM | POA: Insufficient documentation

## 2021-11-09 DIAGNOSIS — O99282 Endocrine, nutritional and metabolic diseases complicating pregnancy, second trimester: Secondary | ICD-10-CM

## 2021-11-09 DIAGNOSIS — O219 Vomiting of pregnancy, unspecified: Secondary | ICD-10-CM | POA: Insufficient documentation

## 2021-11-09 DIAGNOSIS — O10912 Unspecified pre-existing hypertension complicating pregnancy, second trimester: Secondary | ICD-10-CM

## 2021-11-09 DIAGNOSIS — O210 Mild hyperemesis gravidarum: Secondary | ICD-10-CM

## 2021-11-09 DIAGNOSIS — O10012 Pre-existing essential hypertension complicating pregnancy, second trimester: Secondary | ICD-10-CM | POA: Diagnosis not present

## 2021-11-09 DIAGNOSIS — E0590 Thyrotoxicosis, unspecified without thyrotoxic crisis or storm: Secondary | ICD-10-CM | POA: Diagnosis not present

## 2021-11-09 DIAGNOSIS — Z6838 Body mass index (BMI) 38.0-38.9, adult: Secondary | ICD-10-CM | POA: Insufficient documentation

## 2021-11-09 DIAGNOSIS — Z3A27 27 weeks gestation of pregnancy: Secondary | ICD-10-CM

## 2021-11-09 DIAGNOSIS — O99280 Endocrine, nutritional and metabolic diseases complicating pregnancy, unspecified trimester: Secondary | ICD-10-CM | POA: Diagnosis present

## 2021-11-09 DIAGNOSIS — O99212 Obesity complicating pregnancy, second trimester: Secondary | ICD-10-CM | POA: Diagnosis not present

## 2021-11-09 DIAGNOSIS — E669 Obesity, unspecified: Secondary | ICD-10-CM

## 2021-11-19 ENCOUNTER — Ambulatory Visit (INDEPENDENT_AMBULATORY_CARE_PROVIDER_SITE_OTHER): Payer: Medicaid Other | Admitting: Obstetrics & Gynecology

## 2021-11-19 VITALS — BP 127/77 | HR 108 | Wt 199.0 lb

## 2021-11-19 DIAGNOSIS — O0992 Supervision of high risk pregnancy, unspecified, second trimester: Secondary | ICD-10-CM

## 2021-11-19 DIAGNOSIS — E0590 Thyrotoxicosis, unspecified without thyrotoxic crisis or storm: Secondary | ICD-10-CM

## 2021-11-19 DIAGNOSIS — Z23 Encounter for immunization: Secondary | ICD-10-CM | POA: Diagnosis not present

## 2021-11-19 DIAGNOSIS — Z3A28 28 weeks gestation of pregnancy: Secondary | ICD-10-CM

## 2021-11-19 DIAGNOSIS — O99280 Endocrine, nutritional and metabolic diseases complicating pregnancy, unspecified trimester: Secondary | ICD-10-CM

## 2021-11-19 DIAGNOSIS — O99282 Endocrine, nutritional and metabolic diseases complicating pregnancy, second trimester: Secondary | ICD-10-CM

## 2021-11-19 NOTE — Progress Notes (Signed)
ROB 28.[redacted] wks GA ?GTT, CBC, HIV, RPR today ?Reports swelling that does not go down after resting ?TDAP offered and accepted ? ? ?PRENATAL VISIT NOTE ? ?Subjective:  ?April Benson is a 26 y.o. G1P0 at [redacted]w[redacted]d being seen today for ongoing prenatal care.  She is currently monitored for the following issues for this high-risk pregnancy and has Supervision of high-risk pregnancy; Hyperthyroidism in pregnancy, antepartum; and Nausea/vomiting in pregnancy on their problem list. ? ?Patient reports  lower extremity swelling .  Contractions: Not present. Vag. Bleeding: None.  Movement: Present. Denies leaking of fluid.  ? ?The following portions of the patient's history were reviewed and updated as appropriate: allergies, current medications, past family history, past medical history, past social history, past surgical history and problem list.  ? ?Objective:  ? ?Vitals:  ? 11/19/21 0827 11/19/21 0835  ?BP: (!) 143/88 127/77  ?Pulse: (!) 108   ?Weight: 199 lb (90.3 kg)   ? ? ?Fetal Status: Fetal Heart Rate (bpm): 135   Movement: Present    ? ?General:  Alert, oriented and cooperative. Patient is in no acute distress.  ?Skin: Skin is warm and dry. No rash noted.   ?Cardiovascular: Normal heart rate noted  ?Respiratory: Normal respiratory effort, no problems with respiration noted  ?Abdomen: Soft, gravid, appropriate for gestational age.  Pain/Pressure: Absent     ?Pelvic: Cervical exam deferred        ?Extremities: Normal range of motion.  Edema: Trace  ?Mental Status: Normal mood and affect. Normal behavior. Normal judgment and thought content.  ? ?Assessment and Plan:  ?Pregnancy: G1P0 at [redacted]w[redacted]d ?1. Supervision of high risk pregnancy in second trimester ?- 2Hr GTT w/ 1 Hr Carpenter 75 g ?- HIV antibody (with reflex) ?- RPR ?- CBC ? ?2. Hyperthyroidism in pregnancy, antepartum ?Tapazole 40 mg bid; labs due later this month with endocrine ?Antenatal testing at 32 weeks with serial growth [place] ?Take BP frequently ? ?3.  LE  Swelling ?Medical grade thigh high compression stockings ?Elevate feet above heart level (knees not locked) prn through the day ?Discussed S & S of Pre E--face and hand swelling vs LE swelling ? ?Preterm labor symptoms and general obstetric precautions including but not limited to vaginal bleeding, contractions, leaking of fluid and fetal movement were reviewed in detail with the patient. ?Please refer to After Visit Summary for other counseling recommendations.  ? ?No follow-ups on file. ? ?Future Appointments  ?Date Time Provider [place]  ?11/19/2021  8:50 AM Gala Romney, Fredderick Phenix, MD [facility]  ?12/07/2021 10:30 AM [facility] NURSE [facility]  ?12/07/2021 10:45 AM [facility] US4 [facility]  ?12/14/2021 10:30 AM [facility] NURSE [facility]  ?12/14/2021 10:45 AM [facility] US4 [facility]  ?12/21/2021 10:30 AM [facility] NURSE [facility]  ?12/21/2021 10:45 AM [facility] US4 [facility]  ? ? ?Silas Sacramento, MD ? ?

## 2021-11-20 LAB — CBC
HCT: 34.7 % — ABNORMAL LOW (ref 35.0–45.0)
Hemoglobin: 11.4 g/dL — ABNORMAL LOW (ref 11.7–15.5)
MCH: 28.1 pg (ref 27.0–33.0)
MCHC: 32.9 g/dL (ref 32.0–36.0)
MCV: 85.7 fL (ref 80.0–100.0)
MPV: 11.1 fL (ref 7.5–12.5)
Platelets: 243 10*3/uL (ref 140–400)
RBC: 4.05 10*6/uL (ref 3.80–5.10)
RDW: 12.7 % (ref 11.0–15.0)
WBC: 7.1 10*3/uL (ref 3.8–10.8)

## 2021-11-20 LAB — 2HR GTT W 1 HR, CARPENTER, 75 G
Glucose, 1 Hr, Gest: 173 mg/dL (ref 65–179)
Glucose, 2 Hr, Gest: 142 mg/dL (ref 65–152)
Glucose, Fasting, Gest: 86 mg/dL (ref 65–91)

## 2021-11-20 LAB — HIV ANTIBODY (ROUTINE TESTING W REFLEX): HIV 1&2 Ab, 4th Generation: NONREACTIVE

## 2021-11-20 LAB — RPR: RPR Ser Ql: NONREACTIVE

## 2021-12-06 ENCOUNTER — Telehealth: Payer: Self-pay

## 2021-12-06 NOTE — Telephone Encounter (Signed)
Left vm for patient - we had to cancel her appointment for tomorrow due to staffing. Per Lyla Son - we will add her growth to her appointment that's already scheduled for next week.

## 2021-12-07 ENCOUNTER — Ambulatory Visit: Payer: Medicaid Other

## 2021-12-10 ENCOUNTER — Telehealth (INDEPENDENT_AMBULATORY_CARE_PROVIDER_SITE_OTHER): Payer: Medicaid Other | Admitting: Obstetrics & Gynecology

## 2021-12-10 ENCOUNTER — Encounter: Payer: Self-pay | Admitting: Obstetrics & Gynecology

## 2021-12-10 VITALS — BP 132/88 | HR 98 | Wt 202.0 lb

## 2021-12-10 DIAGNOSIS — O99283 Endocrine, nutritional and metabolic diseases complicating pregnancy, third trimester: Secondary | ICD-10-CM

## 2021-12-10 DIAGNOSIS — O99280 Endocrine, nutritional and metabolic diseases complicating pregnancy, unspecified trimester: Secondary | ICD-10-CM

## 2021-12-10 DIAGNOSIS — O219 Vomiting of pregnancy, unspecified: Secondary | ICD-10-CM

## 2021-12-10 DIAGNOSIS — O0993 Supervision of high risk pregnancy, unspecified, third trimester: Secondary | ICD-10-CM

## 2021-12-10 DIAGNOSIS — O0992 Supervision of high risk pregnancy, unspecified, second trimester: Secondary | ICD-10-CM

## 2021-12-10 DIAGNOSIS — E0590 Thyrotoxicosis, unspecified without thyrotoxic crisis or storm: Secondary | ICD-10-CM

## 2021-12-10 DIAGNOSIS — Z3A31 31 weeks gestation of pregnancy: Secondary | ICD-10-CM

## 2021-12-10 NOTE — Progress Notes (Signed)
   OBSTETRICS PRENATAL VIRTUAL VISIT ENCOUNTER NOTE  Provider location: [facility] [place] [facility] [place]   Patient location: Home  I connected with April Benson on 12/10/21 at  9:50 AM EDT by MyChart Video Encounter and verified that I am speaking with the correct person using two identifiers. I discussed the limitations, risks, security and privacy concerns of performing an evaluation and management service virtually and the availability of in person appointments. I also discussed with the patient that there may be a patient responsible charge related to this service. The patient expressed understanding and agreed to proceed. Subjective:  April Benson is a 26 y.o. G1P0 at [redacted]w[redacted]d being seen today for ongoing prenatal care.  She is currently monitored for the following issues for this high-risk pregnancy and has Supervision of high-risk pregnancy; Hyperthyroidism in pregnancy, antepartum; and Nausea/vomiting in pregnancy on their problem list.  Patient reports fatigue.   .  .   . Denies any leaking of fluid.   The following portions of the patient's history were reviewed and updated as appropriate: allergies, current medications, past family history, past medical history, past social history, past surgical history and problem list.   Objective:  There were no vitals filed for this visit.  BP 132/88  Fetal Status:           General:  Alert, oriented and cooperative. Patient is in no acute distress.  Respiratory: Normal respiratory effort, no problems with respiration noted  Mental Status: Normal mood and affect. Normal behavior. Normal judgment and thought content.  Rest of physical exam deferred due to type of encounter  Imaging: No results found.  Assessment and Plan:  Pregnancy: G1P0 at [redacted]w[redacted]d 1. Supervision of high risk pregnancy in second trimester Passed GTT; Nml BP  2. Hyperthyroidism in pregnancy, antepartum Starts testing at 12 weeks with MFM  3.  Nausea/vomiting in pregnancy Some nausea; no vomiting  Preterm labor symptoms and general obstetric precautions including but not limited to vaginal bleeding, contractions, leaking of fluid and fetal movement were reviewed in detail with the patient. I discussed the assessment and treatment plan with the patient. The patient was provided an opportunity to ask questions and all were answered. The patient agreed with the plan and demonstrated an understanding of the instructions. The patient was advised to call back or seek an in-person office evaluation/go to [facility]U at [facility] for any urgent or concerning symptoms. Please refer to After Visit Summary for other counseling recommendations.   I provided 12 minutes of face-to-face time during this encounter.  No follow-ups on file.  Future Appointments  Date Time Provider [place]  12/10/2021  9:50 AM Guss Bunde, MD [facility]  12/14/2021 10:30 AM [facility] NURSE [facility]  12/14/2021 10:45 AM [facility] US4 [facility]  12/21/2021 10:30 AM [facility] NURSE [facility]  12/21/2021 10:45 AM [facility] US4 [facility]    Silas Sacramento, MD [facility], [place]

## 2021-12-14 ENCOUNTER — Ambulatory Visit: Payer: Medicaid Other | Attending: Maternal & Fetal Medicine

## 2021-12-14 ENCOUNTER — Ambulatory Visit: Payer: Medicaid Other

## 2021-12-14 ENCOUNTER — Other Ambulatory Visit: Payer: Self-pay | Admitting: *Deleted

## 2021-12-14 ENCOUNTER — Ambulatory Visit: Payer: Medicaid Other | Admitting: *Deleted

## 2021-12-14 VITALS — BP 121/71 | HR 111

## 2021-12-14 DIAGNOSIS — O99280 Endocrine, nutritional and metabolic diseases complicating pregnancy, unspecified trimester: Secondary | ICD-10-CM | POA: Diagnosis present

## 2021-12-14 DIAGNOSIS — O10913 Unspecified pre-existing hypertension complicating pregnancy, third trimester: Secondary | ICD-10-CM

## 2021-12-14 DIAGNOSIS — O219 Vomiting of pregnancy, unspecified: Secondary | ICD-10-CM | POA: Insufficient documentation

## 2021-12-14 DIAGNOSIS — O99213 Obesity complicating pregnancy, third trimester: Secondary | ICD-10-CM

## 2021-12-14 DIAGNOSIS — O99212 Obesity complicating pregnancy, second trimester: Secondary | ICD-10-CM | POA: Insufficient documentation

## 2021-12-14 DIAGNOSIS — O99282 Endocrine, nutritional and metabolic diseases complicating pregnancy, second trimester: Secondary | ICD-10-CM | POA: Diagnosis not present

## 2021-12-14 DIAGNOSIS — Z3A32 32 weeks gestation of pregnancy: Secondary | ICD-10-CM

## 2021-12-14 DIAGNOSIS — O10012 Pre-existing essential hypertension complicating pregnancy, second trimester: Secondary | ICD-10-CM | POA: Diagnosis not present

## 2021-12-14 DIAGNOSIS — E0590 Thyrotoxicosis, unspecified without thyrotoxic crisis or storm: Secondary | ICD-10-CM | POA: Insufficient documentation

## 2021-12-14 DIAGNOSIS — O10912 Unspecified pre-existing hypertension complicating pregnancy, second trimester: Secondary | ICD-10-CM | POA: Diagnosis present

## 2021-12-14 DIAGNOSIS — E669 Obesity, unspecified: Secondary | ICD-10-CM

## 2021-12-14 DIAGNOSIS — O210 Mild hyperemesis gravidarum: Secondary | ICD-10-CM

## 2021-12-14 DIAGNOSIS — O0993 Supervision of high risk pregnancy, unspecified, third trimester: Secondary | ICD-10-CM

## 2021-12-14 NOTE — Progress Notes (Unsigned)
Us/

## 2021-12-18 ENCOUNTER — Telehealth: Payer: Medicaid Other

## 2021-12-21 ENCOUNTER — Ambulatory Visit: Payer: Medicaid Other | Attending: Maternal & Fetal Medicine

## 2021-12-21 ENCOUNTER — Ambulatory Visit: Payer: Medicaid Other | Admitting: *Deleted

## 2021-12-21 ENCOUNTER — Other Ambulatory Visit: Payer: Self-pay | Admitting: *Deleted

## 2021-12-21 VITALS — BP 128/65 | HR 108

## 2021-12-21 DIAGNOSIS — O219 Vomiting of pregnancy, unspecified: Secondary | ICD-10-CM | POA: Insufficient documentation

## 2021-12-21 DIAGNOSIS — O99212 Obesity complicating pregnancy, second trimester: Secondary | ICD-10-CM | POA: Insufficient documentation

## 2021-12-21 DIAGNOSIS — O99282 Endocrine, nutritional and metabolic diseases complicating pregnancy, second trimester: Secondary | ICD-10-CM | POA: Diagnosis present

## 2021-12-21 DIAGNOSIS — E0590 Thyrotoxicosis, unspecified without thyrotoxic crisis or storm: Secondary | ICD-10-CM

## 2021-12-21 DIAGNOSIS — O10912 Unspecified pre-existing hypertension complicating pregnancy, second trimester: Secondary | ICD-10-CM | POA: Diagnosis present

## 2021-12-21 DIAGNOSIS — O99280 Endocrine, nutritional and metabolic diseases complicating pregnancy, unspecified trimester: Secondary | ICD-10-CM | POA: Insufficient documentation

## 2021-12-21 DIAGNOSIS — O0993 Supervision of high risk pregnancy, unspecified, third trimester: Secondary | ICD-10-CM | POA: Insufficient documentation

## 2021-12-21 DIAGNOSIS — Z3A33 33 weeks gestation of pregnancy: Secondary | ICD-10-CM

## 2021-12-21 DIAGNOSIS — O10913 Unspecified pre-existing hypertension complicating pregnancy, third trimester: Secondary | ICD-10-CM

## 2021-12-21 DIAGNOSIS — O10013 Pre-existing essential hypertension complicating pregnancy, third trimester: Secondary | ICD-10-CM

## 2021-12-21 DIAGNOSIS — E669 Obesity, unspecified: Secondary | ICD-10-CM

## 2021-12-21 DIAGNOSIS — O210 Mild hyperemesis gravidarum: Secondary | ICD-10-CM

## 2021-12-21 DIAGNOSIS — O99283 Endocrine, nutritional and metabolic diseases complicating pregnancy, third trimester: Secondary | ICD-10-CM | POA: Diagnosis not present

## 2021-12-24 ENCOUNTER — Inpatient Hospital Stay (HOSPITAL_COMMUNITY)
Admission: AD | Admit: 2021-12-24 | Discharge: 2021-12-24 | Disposition: A | Discharge status: Home / Self Care | Payer: Medicaid Other | Attending: Obstetrics and Gynecology | Admitting: Obstetrics and Gynecology

## 2021-12-24 ENCOUNTER — Telehealth: Payer: Self-pay | Admitting: *Deleted

## 2021-12-24 ENCOUNTER — Other Ambulatory Visit: Payer: Self-pay

## 2021-12-24 DIAGNOSIS — L259 Unspecified contact dermatitis, unspecified cause: Secondary | ICD-10-CM | POA: Insufficient documentation

## 2021-12-24 DIAGNOSIS — O0993 Supervision of high risk pregnancy, unspecified, third trimester: Secondary | ICD-10-CM

## 2021-12-24 DIAGNOSIS — Z3A33 33 weeks gestation of pregnancy: Secondary | ICD-10-CM | POA: Diagnosis not present

## 2021-12-24 DIAGNOSIS — O99713 Diseases of the skin and subcutaneous tissue complicating pregnancy, third trimester: Secondary | ICD-10-CM | POA: Diagnosis present

## 2021-12-24 DIAGNOSIS — O26893 Other specified pregnancy related conditions, third trimester: Secondary | ICD-10-CM | POA: Insufficient documentation

## 2021-12-24 DIAGNOSIS — E0590 Thyrotoxicosis, unspecified without thyrotoxic crisis or storm: Secondary | ICD-10-CM

## 2021-12-24 DIAGNOSIS — O219 Vomiting of pregnancy, unspecified: Secondary | ICD-10-CM

## 2021-12-24 MED ORDER — TRIAMCINOLONE ACETONIDE 0.1 % EX CREA: TOPICAL_CREAM | Freq: Three times a day (TID) | CUTANEOUS | 0 refills | Status: DC | PRN

## 2021-12-24 MED ORDER — TRIAMCINOLONE ACETONIDE 0.1 % EX CREA
TOPICAL_CREAM | Freq: Three times a day (TID) | CUTANEOUS | Status: DC | PRN
  Filled 2021-12-24: qty 15

## 2021-12-24 NOTE — MAU Provider Note (Signed)
Chief Complaint: Rash  Event Date/Time   First Provider Initiated Contact with Patient 12/24/21 2238     SUBJECTIVE HPI: April Benson is a 26 y.o. G1P0 at [redacted]w[redacted]d who presents to [facility] Admissions reporting itching, burning sensation and lineal rash on right thigh since 1930. States she called after-hours nurse and was told to come to hospital for eval with the next 3-4 hours.   Location: Right outer thigh Quality: itching, burning Duration: <4 hours Context: No known exposure to insects, irritants. Had been wearing pajamas and was inside when Sx started.  Modifying factors: No improvement w/ OTC hydrocortisone cream. Associated signs and symptoms: Neg for fever, chills, SOB, swelling of lips or tongue, N/V.   Past Medical History:  Diagnosis Date   Hypertension    Hyperthyroidism    Tachycardia    OB History  Gravida Para Term Preterm AB Living  1            SAB IAB Ectopic Multiple Live Births               # Outcome Date GA Lbr Len/2nd Weight Sex Delivery Anes PTL Lv  1 Current            Past Surgical History:  Procedure Laterality Date   NO PAST SURGERIES     Social History   Socioeconomic History   Marital status: Married    Spouse name: Not on file   Number of children: Not on file   Years of education: Not on file   Highest education level: Not on file  Occupational History   Not on file  Tobacco Use   Smoking status: Never   Smokeless tobacco: Never  Vaping Use   Vaping Use: Never used  Substance and Sexual Activity   Alcohol use: Not Currently   Drug use: Never   Sexual activity: Yes  Other Topics Concern   Not on file  Social History Narrative   ** Merged History Encounter **       Social Determinants of Health   Financial Resource Strain: Not on file  Food Insecurity: Not on file  Transportation Needs: Not on file  Physical Activity: Not on file  Stress: Not on file  Social Connections: Not on file  Intimate Partner Violence: Not on  file   Family History  Problem Relation Age of Onset   Diabetes Mother    Hypothyroidism Mother    Luiz Blare' disease Mother    No current facility-administered medications on file prior to encounter.   Current Outpatient Medications on File Prior to Encounter  Medication Sig Dispense Refill   aspirin EC 81 MG tablet Take 1 tablet (81 mg total) by mouth daily. Take after 12 weeks for prevention of preeclampsia later in pregnancy 300 tablet 2   cyclobenzaprine (FLEXERIL) 10 MG tablet Take 1 tablet (10 mg total) by mouth 3 (three) times daily as needed for muscle spasms. 30 tablet 2   diphenhydrAMINE (BENADRYL) 25 mg capsule Take 25 mg by mouth every 6 (six) hours as needed.     famotidine (PEPCID) 20 MG tablet Take 1 tablet (20 mg total) by mouth 2 (two) times daily. 30 tablet 0   methimazole (TAPAZOLE) 10 MG tablet Take 10 mg by mouth 3 (three) times daily.     metoprolol succinate (TOPROL-XL) 25 MG 24 hr tablet Take 1 tablet (25 mg total) by mouth daily. 14 tablet 0   ondansetron (ZOFRAN-ODT) 8 MG disintegrating tablet Take 1 tablet (8 mg  total) by mouth every 8 (eight) hours as needed for nausea or vomiting. 90 tablet 0   Prenatal Vit-Fe Fumarate-FA (PRENATAL MULTIVITAMIN) TABS tablet Take 1 tablet by mouth daily at 12 noon.     No Known Allergies  I have reviewed patient's Past Medical Hx, Surgical Hx, Family Hx, Social Hx, medications and allergies.   Review of Systems  OBJECTIVE Patient Vitals for the past 24 hrs:  BP Temp Temp src Pulse Resp Height Weight  12/24/21 2219 121/74 98.5 F (36.9 C) Oral (!) 110 18 5\' 3"  (1.6 m) 94.2 kg   Constitutional: Well-developed, well-nourished female in no acute distress.  Cardiovascular: Mild tachycardia Respiratory: normal rate and effort.  GI: Gravid appropriate for gestational age.  MS: Linear, erythematous, raised rash on right outer thigh. 3x5 cm. No warmth or surrounding erythema. Tiny red spot next to linear rash. Non-tender.   Neurologic: Alert and oriented x 4.  GU: Deferred  FHR 147 by doppler.   LAB RESULTS No results found for this or any previous visit (from the past 24 hour(s)).  IMAGING NA  MAU COURSE Pruritic rash of unknown etiology. Not C/W Pemphigoid Gestationis or significant allergic reaction. Likely contact dermatitis or insect bite. Will Tx w/ Triamcinolone cream. Instructed to F/U w/ PCP if Sx worsen. Go to ED for severe allergic reaction. Wash clothing and skin in case irritant is still present.   Orders Placed This Encounter  Procedures   Discharge patient   Meds ordered this encounter  Medications   triamcinolone cream (KENALOG) 0.1 % cream   triamcinolone cream (KENALOG) 0.1 %    Sig: Apply topically 3 (three) times daily as needed (rash, itching).    Dispense:  30 g    Refill:  0    Order Specific Question:   Supervising Provider    Answer:   [[id number]]    MDM  ASSESSMENT 1. Contact dermatitis, unspecified contact dermatitis type, unspecified trigger   2. [redacted] weeks gestation of pregnancy     PLAN Discharge home in stable condition. Allergic reaction precautions  Follow-up Information     [facility] [place] Follow up.   Specialty: Obstetrics and Gynecology Why: Routine prenatal visit Contact information: [address], [address] [place] [phone]        [phone], MD Follow up.   Specialty: Family Medicine Why: if rash does not improve or worsens Contact informationAbner Greenspan [place] [phone]         [phone], MD .   Specialty: Obstetrics and Gynecology Contact information: [address] [place] [phone]         [facility] EMERGENCY DEPARTMENT Follow up.   Specialty: Emergency Medicine Why: for Anaphylactic reaction Contact information: [address] [place]  [place] [phone]               Allergies as of 12/24/2021   No Known Allergies      Medication List     TAKE these medications    aspirin EC 81 MG tablet Take 1 tablet (81 mg total) by mouth daily. Take after 12 weeks for prevention of preeclampsia later in pregnancy   cyclobenzaprine 10 MG tablet Commonly known as: FLEXERIL Take 1 tablet (10 mg total) by mouth 3 (three) times daily as needed for muscle spasms.   diphenhydrAMINE 25 mg capsule Commonly known as: BENADRYL Take 25 mg by mouth every  6 (six) hours as needed.   famotidine 20 MG tablet Commonly known as: PEPCID Take 1 tablet (20 mg total) by mouth 2 (two) times daily.   methimazole 10 MG tablet Commonly known as: TAPAZOLE Take 10 mg by mouth 3 (three) times daily.   metoprolol succinate 25 MG 24 hr tablet Commonly known as: TOPROL-XL Take 1 tablet (25 mg total) by mouth daily.   ondansetron 8 MG disintegrating tablet Commonly known as: ZOFRAN-ODT Take 1 tablet (8 mg total) by mouth every 8 (eight) hours as needed for nausea or vomiting.   prenatal multivitamin Tabs tablet Take 1 tablet by mouth daily at 12 noon.   triamcinolone cream 0.1 % Commonly known as: KENALOG Apply topically 3 (three) times daily as needed (rash, itching).         Katrinka BlazingSmith, [place], [place] 12/24/2021  11:07 PM

## 2021-12-24 NOTE — MAU Note (Signed)
Pt says at 730pm- she had itching on right thigh Then at 8 pm- had rash on thigh  Then had burning  pain on thigh  Then saw a red dot on thigh  Did not see any tick- [facility]- office [place] - called office - told to come here - told she needed to be seen in next 3-4 hrs.   Now still feels - itching and burning on thigh

## 2021-12-24 NOTE — Telephone Encounter (Signed)
Returned call from 1:22 PM. Left patient a message to call ands schedule ROB appointment for this week or next week, 36 weeks, and weekly after that.

## 2021-12-28 ENCOUNTER — Ambulatory Visit: Payer: Medicaid Other | Admitting: *Deleted

## 2021-12-28 ENCOUNTER — Ambulatory Visit: Payer: Medicaid Other | Attending: Obstetrics and Gynecology | Admitting: Obstetrics and Gynecology

## 2021-12-28 VITALS — BP 118/66 | HR 112

## 2021-12-28 DIAGNOSIS — E0590 Thyrotoxicosis, unspecified without thyrotoxic crisis or storm: Secondary | ICD-10-CM | POA: Diagnosis not present

## 2021-12-28 DIAGNOSIS — Z3A34 34 weeks gestation of pregnancy: Secondary | ICD-10-CM | POA: Diagnosis not present

## 2021-12-28 DIAGNOSIS — O10913 Unspecified pre-existing hypertension complicating pregnancy, third trimester: Secondary | ICD-10-CM

## 2021-12-28 DIAGNOSIS — O99283 Endocrine, nutritional and metabolic diseases complicating pregnancy, third trimester: Secondary | ICD-10-CM

## 2021-12-28 DIAGNOSIS — O99213 Obesity complicating pregnancy, third trimester: Secondary | ICD-10-CM

## 2021-12-28 DIAGNOSIS — O0993 Supervision of high risk pregnancy, unspecified, third trimester: Secondary | ICD-10-CM

## 2021-12-28 DIAGNOSIS — O219 Vomiting of pregnancy, unspecified: Secondary | ICD-10-CM

## 2021-12-28 NOTE — Procedures (Signed)
April Benson [birth year] [redacted]w[redacted]d  Fetus A Non-Stress Test Interpretation for 12/28/21  Indication: Chronic Hypertenstion  Fetal Heart Rate A Mode: External Baseline Rate (A): 145 bpm Variability: Moderate Accelerations: 15 x 15 Decelerations: None Multiple birth?: No  Uterine Activity Mode: Toco Contraction Frequency (min): none Resting Tone Palpated: Relaxed  Interpretation (Fetal Testing) Nonstress Test Interpretation: Reactive Overall Impression: Reassuring for gestational age Comments: tracing reviewed by Dr. Judeth Cornfield

## 2021-12-31 ENCOUNTER — Telehealth (INDEPENDENT_AMBULATORY_CARE_PROVIDER_SITE_OTHER): Payer: Medicaid Other | Admitting: Obstetrics & Gynecology

## 2021-12-31 ENCOUNTER — Encounter: Payer: Self-pay | Admitting: Obstetrics & Gynecology

## 2021-12-31 VITALS — BP 126/86 | Wt 205.0 lb

## 2021-12-31 DIAGNOSIS — O0993 Supervision of high risk pregnancy, unspecified, third trimester: Secondary | ICD-10-CM

## 2021-12-31 DIAGNOSIS — O99280 Endocrine, nutritional and metabolic diseases complicating pregnancy, unspecified trimester: Secondary | ICD-10-CM

## 2021-12-31 DIAGNOSIS — E0590 Thyrotoxicosis, unspecified without thyrotoxic crisis or storm: Secondary | ICD-10-CM

## 2021-12-31 NOTE — Progress Notes (Signed)
OBSTETRICS PRENATAL VIRTUAL VISIT ENCOUNTER NOTE  Provider location: [facility] [place]   Patient location: Home  I connected with [place] on 12/31/21 at  1:45 PM EDT by MyChart Video Encounter and verified that I am speaking with the correct person using two identifiers. I discussed the limitations, risks, security and privacy concerns of performing an evaluation and management service virtually and the availability of in person appointments. I also discussed with the patient that there may be a patient responsible charge related to this service. The patient expressed understanding and agreed to proceed. Subjective:  April Benson is a 26 y.o. G1P0 at [redacted]w[redacted]d being seen today for ongoing prenatal care.  She is currently monitored for the following issues for this high-risk pregnancy and has Supervision of high-risk pregnancy; Hyperthyroidism in pregnancy, antepartum; and Nausea/vomiting in pregnancy on their problem list.  Patient reports  no tachycardia, no HTN, non N/V, decreased anxiety.  Pt had TSH dran with enocrine and they increased her thyroid medication. .  Contractions: Irritability. Vag. Bleeding: None.  Movement: Present. Denies any leaking of fluid.   The following portions of the patient's history were reviewed and updated as appropriate: allergies, current medications, past family history, past medical history, past social history, past surgical history and problem list.   Objective:   Vitals:   12/31/21 1336  Weight: 205 lb (93 kg)    Fetal Status:     Movement: Present     General:  Alert, oriented and cooperative. Patient is in no acute distress.  Respiratory: Normal respiratory effort, no problems with respiration noted  Mental Status: Normal mood and affect. Normal behavior. Normal judgment and thought content.  Rest of physical exam deferred due to type of encounter  Imaging: [place] MFM FETAL BPP WO NON STRESS  Result Date:  12/21/2021 ----------------------------------------------------------------------  OBSTETRICS REPORT                       (Signed Final 12/21/2021 01:59 pm) ---------------------------------------------------------------------- Patient Info  ID #:       [id number]                          D.O.B.:  1996/03/10 (26 yrs)  Name:       April Benson [facility]                  Visit Date: 12/21/2021 11:33 am ---------------------------------------------------------------------- Performed By  Attending:        Johnell Comings MD         Ref. Address:     [place], [place]  [place]  Performed By:     Eveline Keto         [facility]                                     [facility] at                                                             [place] for                                                             Women  Referred By:      Laury Deep CNM ---------------------------------------------------------------------- Orders  #  Description                           Code        Ordered By  1  [place] MFM FETAL BPP WO NON               76819.01    [place] ----------------------------------------------------------------------  #  Order #                     Accession #                Episode #  1  [id number]                   [id number]                 [id number] ---------------------------------------------------------------------- Indications  Hyperthyroid (Methimazole and metoprolol)      O99.280 0000000  Obesity complicating pregnancy, third          O99.213  trimester  Hypertension - Chronic/Pre-existing (no        O10.019  meds)  [redacted] weeks gestation of pregnancy                Z3A.33  Hyperemesis gravidarum                          O21.0  LR NIPS (per patient) ---------------------------------------------------------------------- Fetal Evaluation  Num Of Fetuses:         1  Fetal Heart Rate(bpm):  141  Cardiac Activity:       Observed  Presentation:           Cephalic  Placenta:  Anterior  P. Cord Insertion:      Previously Visualized  Amniotic Fluid  AFI FV:      Within normal limits  AFI Sum(cm)     %Tile       Largest Pocket(cm)  18.23           67          7.24  RUQ(cm)       RLQ(cm)       LUQ(cm)        LLQ(cm)  4.55          2.48          3.96           7.24 ---------------------------------------------------------------------- Biophysical Evaluation  Amniotic F.V:   Within normal limits       F. Tone:        Observed  F. Movement:    Observed                   Score:          8/8  F. Breathing:   Observed ---------------------------------------------------------------------- OB History  Gravidity:    1  Living:       0 ---------------------------------------------------------------------- Gestational Age  LMP:           33w 0d        Date:  05/04/21                 EDD:   02/08/22  Best:          33w 0d     Det. By:  LMP  (05/04/21)          EDD:   02/08/22 ---------------------------------------------------------------------- Anatomy  Heart:                 Previously seen        Kidneys:                Appear normal  Diaphragm:             Appears normal         Bladder:                Appears normal  Stomach:               Appears normal, left                         sided ---------------------------------------------------------------------- Cervix Uterus Adnexa  Cervix  Not visualized (advanced GA >24wks)  Uterus  No abnormality visualized.  Right Ovary  Not visualized.  Left Ovary  Not visualized. ---------------------------------------------------------------------- Comments  This patient was seen for a BPP due to hyperthyroidism.  She  denies any problems since her last exam and reports feeling   fetal movements throughout the day.  A biophysical profile performed today was 8 out of 8.  There was normal amniotic fluid noted on today's ultrasound  exam.  She will return in 1 week for another BPP. ----------------------------------------------------------------------                   Johnell Comings, MD Electronically Signed Final Report   12/21/2021 01:59 pm ----------------------------------------------------------------------  [place] MFM FETAL BPP WO NON STRESS  Result Date: 12/14/2021 ----------------------------------------------------------------------  OBSTETRICS REPORT                       (Signed Final 12/14/2021 12:51 pm) ---------------------------------------------------------------------- Patient Info  ID #:       [id number]                          D.O.B.:  15-Jun-1996 (26 yrs)  Name:       April Benson [facility]                  Visit Date: 12/14/2021 11:04 am ---------------------------------------------------------------------- Performed By  Attending:        Tama High MD        Ref. Address:     [place], [place]  Performed By:     Nathen May       Location[facility]         [facility]                                     Fetal Care at                                                             [place] for                                                             Women  Referred By:      Laury Deep CNM ---------------------------------------------------------------------- Orders  #  Description                           Code        Ordered By  1  [place] MFM FETAL BPP WO NON               [id number]    [place]  STRESS                                            [place]  2  [place] MFM OB FOLLOW UP                   [id number]    Sander Nephew ----------------------------------------------------------------------  #  Order #                     Accession #                Episode #  1  [id number]                   [id number]                 [id number]  2  [id number]                   [id number]                 [id number] ---------------------------------------------------------------------- Indications  Obesity complicating pregnancy, second         O99.212  trimester (BMI 38)  Hyperthyroid (Methimazole and metoprolol)      O99.280 E05.90  Hypertension - Chronic/Pre-existing (no        O10.019  meds)  Hyperemesis gravidarum                         O21.0  Encounter for other antenatal screening        Z36.2  follow-up  [redacted] weeks gestation of pregnancy                Z3A.32  LR NIPS (per patient) ---------------------------------------------------------------------- Fetal Evaluation  Num Of Fetuses:         1  Fetal Heart Rate(bpm):  149  Cardiac Activity:       Observed  Presentation:           Cephalic  Placenta:               Anterior  P. Cord Insertion:      Previously Visualized  Amniotic Fluid  AFI FV:      Within normal limits  AFI Sum(cm)     %Tile       Largest Pocket(cm)  18.16           67          7.06  RUQ(cm)       RLQ(cm)       LUQ(cm)        LLQ(cm)  3.17          4.23          7.06           3.7 ---------------------------------------------------------------------- Biophysical Evaluation  Amniotic F.V:   Pocket => 2 cm             F. Tone:        Observed  F. Movement:    Observed  Score:          8/8  F. Breathing:   Observed ---------------------------------------------------------------------- Biometry  BPD:      82.5  mm     G. Age:  33w 1d         76  %    CI:        76.29   %    70 - 86                                                          FL/HC:      20.6   %    19.1 - 21.3  HC:      299.3  mm     G. Age:  33w 1d         44  %    HC/AC:      0.97        0.96 - 1.17  AC:       308.9  mm     G. Age:  34w 6d         98  %    FL/BPD:     74.7   %    71 - 87  FL:       61.6  mm     G. Age:  32w 0d         35  %    FL/AC:      19.9   %    20 - 24  Est. FW:    2258  gm           5 lb     89  % ---------------------------------------------------------------------- OB History  Gravidity:    1  Living:       0 ---------------------------------------------------------------------- Gestational Age  LMP:           32w 0d        Date:  05/04/21                 EDD:   02/08/22  U/S Today:     33w 2d                                        EDD:   01/30/22  Best:          32w 0d     Det. By:  LMP  (05/04/21)          EDD:   02/08/22 ---------------------------------------------------------------------- Anatomy  Cranium:               Appears normal         LVOT:                   Previously seen  Cavum:                 Previously seen        Aortic Arch:            Previously seen  Ventricles:            Previously seen        Ductal Arch:            Previously  seen  Choroid Plexus:        Previously seen        Diaphragm:              Appears normal  Cerebellum:            Previously seen        Stomach:                Appears normal, left                                                                        sided  Posterior Fossa:       Previously seen        Abdomen:                Previously seen  Nuchal Fold:           Previously seen        Abdominal Wall:         Previously seen  Face:                  Orbits and profile     Cord Vessels:           Previously seen                         previously seen  Lips:                  Previously seen        Kidneys:                Appear normal  Palate:                Not well visualized    Bladder:                Appears normal  Thoracic:              Previously seen        Spine:                  Previously seen  Heart:                 Previously seen        Upper Extremities:      Previously seen  RVOT:                  Previously seen        Lower  Extremities:      Previously seen  Other:  Female gender previously seen. Nasal Bone, Lenses, VC, 3VV and          3VTV previously visualized. ---------------------------------------------------------------------- Cervix Uterus Adnexa  Cervix  Not visualized (advanced GA >24wks) ---------------------------------------------------------------------- Impression  Maternal hyperthyroidism.  Patient takes methimazole 10 mg  3 times daily and metoprolol XL 25 mg daily.  Blood pressure today at her office is 121/71 mmHg and pulse  111/minute.  Fetal growth is appropriate for gestational age.  Amniotic fluid  is normal and good fetal activity seen.  Antenatal testing is  reassuring.  BPP 8/8.  Fetal heart rate and rhythm appears  normal.  Fetal neck could not be assessed because of fetal  position.  I reassured the couple of the findings. ---------------------------------------------------------------------- Recommendations  -Continue weekly BPP till delivery. ----------------------------------------------------------------------                 Tama High, MD Electronically Signed Final Report   12/14/2021 12:51 pm ----------------------------------------------------------------------  [place] MFM OB FOLLOW UP  Result Date: 12/14/2021 ----------------------------------------------------------------------  OBSTETRICS REPORT                       (Signed Final 12/14/2021 12:51 pm) ---------------------------------------------------------------------- Patient Info  ID #:       [id number]                          D.O.B.:  19-Aug-1995 (26 yrs)  Name:       April Benson [facility]                  Visit Date: 12/14/2021 11:04 am ---------------------------------------------------------------------- Performed By  Attending:        Tama High MD        Ref. Address:     [place], [place]  Performed By:     Nathen May       [facility]                                     Fetal Care at                                                             [place] for                                                             Women  Referred By:      Laury Deep CNM ---------------------------------------------------------------------- Orders  #  Description                           Code        Ordered By  1  [place] MFM FETAL BPP WO NON               76819.01    [place]  2  [place] MFM OB FOLLOW UP                   76816.01    Sander Nephew ----------------------------------------------------------------------  #  Order #                     Accession #                Episode #  1  [id number]                   [id number]                 [id number]  2  [id number]                   [id number]                 [id number] ---------------------------------------------------------------------- Indications  Obesity complicating pregnancy, second         O99.212  trimester (BMI 38)  Hyperthyroid (Methimazole and metoprolol)      O99.280 E05.90  Hypertension - Chronic/Pre-existing (no        O10.019  meds)  Hyperemesis gravidarum                         O21.0  Encounter for other antenatal screening        Z36.2  follow-up  [redacted] weeks gestation of pregnancy                Z3A.32  LR NIPS (per patient) ---------------------------------------------------------------------- Fetal Evaluation  Num Of Fetuses:         1  Fetal Heart Rate(bpm):  149  Cardiac Activity:       Observed  Presentation:           Cephalic  Placenta:               Anterior  P. Cord Insertion:      Previously Visualized  Amniotic Fluid  AFI FV:      Within normal limits  AFI Sum(cm)     %Tile       Largest Pocket(cm)   18.16           67  7.06  RUQ(cm)       RLQ(cm)       LUQ(cm)        LLQ(cm)  3.17          4.23          7.06           3.7 ---------------------------------------------------------------------- Biophysical Evaluation  Amniotic F.V:   Pocket => 2 cm             F. Tone:        Observed  F. Movement:    Observed                   Score:          8/8  F. Breathing:   Observed ---------------------------------------------------------------------- Biometry  BPD:      82.5  mm     G. Age:  33w 1d         76  %    CI:        76.29   %    70 - 86                                                          FL/HC:      20.6   %    19.1 - 21.3  HC:      299.3  mm     G. Age:  33w 1d         44  %    HC/AC:      0.97        0.96 - 1.17  AC:      308.9  mm     G. Age:  34w 6d         98  %    FL/BPD:     74.7   %    71 - 87  FL:       61.6  mm     G. Age:  32w 0d         35  %    FL/AC:      19.9   %    20 - 24  Est. FW:    2258  gm           5 lb     89  % ---------------------------------------------------------------------- OB History  Gravidity:    1  Living:       0 ---------------------------------------------------------------------- Gestational Age  LMP:           32w 0d        Date:  05/04/21                 EDD:   02/08/22  U/S Today:     33w 2d                                        EDD:   01/30/22  Best:          32w 0d     Det. By:  LMP  (05/04/21)          EDD:   02/08/22 ---------------------------------------------------------------------- Anatomy  Cranium:  Appears normal         LVOT:                   Previously seen  Cavum:                 Previously seen        Aortic Arch:            Previously seen  Ventricles:            Previously seen        Ductal Arch:            Previously seen  Choroid Plexus:        Previously seen        Diaphragm:              Appears normal  Cerebellum:            Previously seen        Stomach:                Appears normal, left                                                                         sided  Posterior Fossa:       Previously seen        Abdomen:                Previously seen  Nuchal Fold:           Previously seen        Abdominal Wall:         Previously seen  Face:                  Orbits and profile     Cord Vessels:           Previously seen                         previously seen  Lips:                  Previously seen        Kidneys:                Appear normal  Palate:                Not well visualized    Bladder:                Appears normal  Thoracic:              Previously seen        Spine:                  Previously seen  Heart:                 Previously seen        Upper Extremities:      Previously seen  RVOT:                  Previously seen        Lower Extremities:      Previously seen  Other:  Female gender previously seen. Nasal Bone, Lenses, VC, 3VV and          3VTV previously visualized. ---------------------------------------------------------------------- Cervix Uterus Adnexa  Cervix  Not visualized (advanced GA >24wks) ---------------------------------------------------------------------- Impression  Maternal hyperthyroidism.  Patient takes methimazole 10 mg  3 times daily and metoprolol XL 25 mg daily.  Blood pressure today at her office is 121/71 mmHg and pulse  111/minute.  Fetal growth is appropriate for gestational age.  Amniotic fluid  is normal and good fetal activity seen.  Antenatal testing is  reassuring.  BPP 8/8.  Fetal heart rate and rhythm appears  normal.  Fetal neck could not be assessed because of fetal  position.  I reassured the couple of the findings. ---------------------------------------------------------------------- Recommendations  -Continue weekly BPP till delivery. ----------------------------------------------------------------------                 Tama High, MD Electronically Signed Final Report   12/14/2021 12:51 pm ----------------------------------------------------------------------    Assessment and Plan:  Pregnancy: G1P0 at [redacted]w[redacted]d 1. Hyperthyroid pregnancy Continue antenatal testing.  When patient was first seen or her practice she was not well controlled and she remembers being told she would be induced at 37 weeks..  She is much better controlled today.  I sent a note to MFM to confirm delivery plan.  Our algorithm currently states 39 weeks delivery for well-controlled hypothyroidism on medication.  Preterm labor symptoms and general obstetric precautions including but not limited to vaginal bleeding, contractions, leaking of fluid and fetal movement were reviewed in detail with the patient. I discussed the assessment and treatment plan with the patient. The patient was provided an opportunity to ask questions and all were answered. The patient agreed with the plan and demonstrated an understanding of the instructions. The patient was advised to call back or seek an in-person office evaluation/go to [facility] at [facility] for any urgent or concerning symptoms. Please refer to After Visit Summary for other counseling recommendations.   I provided 12 minutes of face-to-face time during this encounter.  No follow-ups on file.  Future Appointments  Date Time Provider [place]  01/04/2022  9:30 AM [facility] NURSE [facility]  01/04/2022  9:45 AM [facility] US6 [facility]  01/11/2022  9:45 AM [facility] NURSE [facility]  01/11/2022 10:00 AM [facility] US1 [facility]  01/14/2022  2:30 PM Guss Bunde, MD [facility]  01/18/2022 10:30 AM [facility] NURSE [facility]  01/18/2022 10:45 AM [facility] US4 [facility]  01/21/2022  2:50 PM Kiona Blume, Fredderick Phenix, MD [facility]    Silas [place], MD [facility] [place] [place]

## 2022-01-04 ENCOUNTER — Other Ambulatory Visit: Payer: Self-pay | Admitting: *Deleted

## 2022-01-04 ENCOUNTER — Ambulatory Visit: Payer: Medicaid Other | Attending: Obstetrics and Gynecology

## 2022-01-04 ENCOUNTER — Ambulatory Visit: Payer: Medicaid Other | Admitting: *Deleted

## 2022-01-04 ENCOUNTER — Encounter: Payer: Self-pay | Admitting: *Deleted

## 2022-01-04 VITALS — BP 123/71 | HR 100

## 2022-01-04 DIAGNOSIS — E0590 Thyrotoxicosis, unspecified without thyrotoxic crisis or storm: Secondary | ICD-10-CM

## 2022-01-04 DIAGNOSIS — O10913 Unspecified pre-existing hypertension complicating pregnancy, third trimester: Secondary | ICD-10-CM | POA: Insufficient documentation

## 2022-01-04 DIAGNOSIS — E669 Obesity, unspecified: Secondary | ICD-10-CM

## 2022-01-04 DIAGNOSIS — O0993 Supervision of high risk pregnancy, unspecified, third trimester: Secondary | ICD-10-CM | POA: Insufficient documentation

## 2022-01-04 DIAGNOSIS — O219 Vomiting of pregnancy, unspecified: Secondary | ICD-10-CM | POA: Diagnosis present

## 2022-01-04 DIAGNOSIS — O99283 Endocrine, nutritional and metabolic diseases complicating pregnancy, third trimester: Secondary | ICD-10-CM

## 2022-01-04 DIAGNOSIS — O10013 Pre-existing essential hypertension complicating pregnancy, third trimester: Secondary | ICD-10-CM

## 2022-01-04 DIAGNOSIS — O99213 Obesity complicating pregnancy, third trimester: Secondary | ICD-10-CM

## 2022-01-04 DIAGNOSIS — Z3A35 35 weeks gestation of pregnancy: Secondary | ICD-10-CM

## 2022-01-04 DIAGNOSIS — O210 Mild hyperemesis gravidarum: Secondary | ICD-10-CM

## 2022-01-04 DIAGNOSIS — O99280 Endocrine, nutritional and metabolic diseases complicating pregnancy, unspecified trimester: Secondary | ICD-10-CM | POA: Diagnosis present

## 2022-01-11 ENCOUNTER — Ambulatory Visit: Payer: Medicaid Other | Attending: Obstetrics and Gynecology

## 2022-01-11 ENCOUNTER — Ambulatory Visit: Payer: Medicaid Other | Admitting: *Deleted

## 2022-01-11 VITALS — BP 126/76 | HR 113

## 2022-01-11 DIAGNOSIS — E0590 Thyrotoxicosis, unspecified without thyrotoxic crisis or storm: Secondary | ICD-10-CM

## 2022-01-11 DIAGNOSIS — O0993 Supervision of high risk pregnancy, unspecified, third trimester: Secondary | ICD-10-CM | POA: Insufficient documentation

## 2022-01-11 DIAGNOSIS — O219 Vomiting of pregnancy, unspecified: Secondary | ICD-10-CM | POA: Insufficient documentation

## 2022-01-11 DIAGNOSIS — O99280 Endocrine, nutritional and metabolic diseases complicating pregnancy, unspecified trimester: Secondary | ICD-10-CM | POA: Insufficient documentation

## 2022-01-11 DIAGNOSIS — O99283 Endocrine, nutritional and metabolic diseases complicating pregnancy, third trimester: Secondary | ICD-10-CM | POA: Diagnosis present

## 2022-01-11 DIAGNOSIS — E669 Obesity, unspecified: Secondary | ICD-10-CM

## 2022-01-11 DIAGNOSIS — O210 Mild hyperemesis gravidarum: Secondary | ICD-10-CM

## 2022-01-11 DIAGNOSIS — O10013 Pre-existing essential hypertension complicating pregnancy, third trimester: Secondary | ICD-10-CM

## 2022-01-11 DIAGNOSIS — O10913 Unspecified pre-existing hypertension complicating pregnancy, third trimester: Secondary | ICD-10-CM | POA: Diagnosis present

## 2022-01-11 DIAGNOSIS — O99213 Obesity complicating pregnancy, third trimester: Secondary | ICD-10-CM | POA: Diagnosis not present

## 2022-01-11 DIAGNOSIS — Z3A36 36 weeks gestation of pregnancy: Secondary | ICD-10-CM

## 2022-01-14 ENCOUNTER — Ambulatory Visit (INDEPENDENT_AMBULATORY_CARE_PROVIDER_SITE_OTHER): Payer: Medicaid Other | Admitting: Obstetrics & Gynecology

## 2022-01-14 ENCOUNTER — Other Ambulatory Visit (HOSPITAL_COMMUNITY)
Admission: RE | Admit: 2022-01-14 | Discharge: 2022-01-14 | Disposition: A | Discharge status: Home / Self Care | Payer: Medicaid Other | Source: Ambulatory Visit | Attending: Obstetrics & Gynecology | Admitting: Obstetrics & Gynecology

## 2022-01-14 VITALS — BP 126/85 | HR 116 | Wt 205.0 lb

## 2022-01-14 DIAGNOSIS — O0993 Supervision of high risk pregnancy, unspecified, third trimester: Secondary | ICD-10-CM | POA: Diagnosis present

## 2022-01-14 DIAGNOSIS — N898 Other specified noninflammatory disorders of vagina: Secondary | ICD-10-CM | POA: Insufficient documentation

## 2022-01-14 DIAGNOSIS — E0590 Thyrotoxicosis, unspecified without thyrotoxic crisis or storm: Secondary | ICD-10-CM

## 2022-01-14 DIAGNOSIS — O99280 Endocrine, nutritional and metabolic diseases complicating pregnancy, unspecified trimester: Secondary | ICD-10-CM

## 2022-01-14 LAB — OB RESULTS CONSOLE GBS: GBS: NEGATIVE

## 2022-01-15 ENCOUNTER — Telehealth (HOSPITAL_COMMUNITY): Payer: Self-pay | Admitting: *Deleted

## 2022-01-15 ENCOUNTER — Encounter (HOSPITAL_COMMUNITY): Payer: Self-pay | Admitting: *Deleted

## 2022-01-15 LAB — CERVICOVAGINAL ANCILLARY ONLY
Bacterial Vaginitis (gardnerella): POSITIVE — AB
Candida Glabrata: NEGATIVE
Candida Vaginitis: POSITIVE — AB
Chlamydia: NEGATIVE
Comment: NEGATIVE
Comment: NEGATIVE
Comment: NEGATIVE
Comment: NEGATIVE
Comment: NORMAL
Neisseria Gonorrhea: NEGATIVE

## 2022-01-16 ENCOUNTER — Other Ambulatory Visit: Payer: Self-pay | Admitting: Advanced Practice Midwife

## 2022-01-17 LAB — CULTURE, BETA STREP (GROUP B ONLY)
MICRO NUMBER:: 13574513
SPECIMEN QUALITY:: ADEQUATE

## 2022-01-18 ENCOUNTER — Inpatient Hospital Stay
Admission: AD | Admit: 2022-01-18 | Discharge: 2022-01-20 | DRG: 807 | Disposition: A | Discharge status: Home / Self Care | Payer: Medicaid Other | Attending: Obstetrics and Gynecology | Admitting: Obstetrics and Gynecology

## 2022-01-18 ENCOUNTER — Inpatient Hospital Stay (HOSPITAL_COMMUNITY): Payer: Medicaid Other

## 2022-01-18 ENCOUNTER — Ambulatory Visit: Payer: Medicaid Other | Attending: Obstetrics and Gynecology

## 2022-01-18 ENCOUNTER — Inpatient Hospital Stay (HOSPITAL_COMMUNITY): Payer: Medicaid Other | Admitting: Anesthesiology

## 2022-01-18 ENCOUNTER — Ambulatory Visit: Payer: Medicaid Other

## 2022-01-18 ENCOUNTER — Encounter (HOSPITAL_COMMUNITY): Payer: Self-pay | Admitting: Obstetrics & Gynecology

## 2022-01-18 ENCOUNTER — Other Ambulatory Visit: Payer: Self-pay

## 2022-01-18 DIAGNOSIS — Z3490 Encounter for supervision of normal pregnancy, unspecified, unspecified trimester: Secondary | ICD-10-CM

## 2022-01-18 DIAGNOSIS — O134 Gestational [pregnancy-induced] hypertension without significant proteinuria, complicating childbirth: Secondary | ICD-10-CM | POA: Diagnosis present

## 2022-01-18 DIAGNOSIS — O0990 Supervision of high risk pregnancy, unspecified, unspecified trimester: Secondary | ICD-10-CM

## 2022-01-18 DIAGNOSIS — O139 Gestational [pregnancy-induced] hypertension without significant proteinuria, unspecified trimester: Secondary | ICD-10-CM | POA: Diagnosis not present

## 2022-01-18 DIAGNOSIS — E0590 Thyrotoxicosis, unspecified without thyrotoxic crisis or storm: Secondary | ICD-10-CM

## 2022-01-18 DIAGNOSIS — Z3A37 37 weeks gestation of pregnancy: Secondary | ICD-10-CM | POA: Diagnosis not present

## 2022-01-18 DIAGNOSIS — O99284 Endocrine, nutritional and metabolic diseases complicating childbirth: Secondary | ICD-10-CM | POA: Diagnosis present

## 2022-01-18 DIAGNOSIS — O99280 Endocrine, nutritional and metabolic diseases complicating pregnancy, unspecified trimester: Secondary | ICD-10-CM | POA: Diagnosis present

## 2022-01-18 DIAGNOSIS — E0500 Thyrotoxicosis with diffuse goiter without thyrotoxic crisis or storm: Secondary | ICD-10-CM | POA: Diagnosis present

## 2022-01-18 DIAGNOSIS — O219 Vomiting of pregnancy, unspecified: Secondary | ICD-10-CM | POA: Diagnosis present

## 2022-01-18 DIAGNOSIS — Z8759 Personal history of other complications of pregnancy, childbirth and the puerperium: Secondary | ICD-10-CM | POA: Diagnosis not present

## 2022-01-18 LAB — CBC
HCT: 30.7 % — ABNORMAL LOW (ref 36.0–46.0)
Hemoglobin: 10.1 g/dL — ABNORMAL LOW (ref 12.0–15.0)
MCH: 27.2 pg (ref 26.0–34.0)
MCHC: 32.9 g/dL (ref 30.0–36.0)
MCV: 82.5 fL (ref 80.0–100.0)
Platelets: 215 10*3/uL (ref 150–400)
RBC: 3.72 MIL/uL — ABNORMAL LOW (ref 3.87–5.11)
RDW: 13.4 % (ref 11.5–15.5)
WBC: 7.8 10*3/uL (ref 4.0–10.5)
nRBC: 0 % (ref 0.0–0.2)

## 2022-01-18 LAB — TSH: TSH: <0.01 u[IU]/mL — ABNORMAL LOW (ref 0.350–4.500)

## 2022-01-18 LAB — TYPE AND SCREEN
ABO/RH(D): A POS
Antibody Screen: NEGATIVE

## 2022-01-18 LAB — T4, FREE: Free T4: 1.66 ng/dL — ABNORMAL HIGH (ref 0.61–1.12)

## 2022-01-18 MED ORDER — ONDANSETRON HCL 4 MG/2ML IJ SOLN
4.0000 mg | Freq: Four times a day (QID) | INTRAMUSCULAR | Status: DC | PRN
  Administered 2022-01-19: 4 mg via INTRAVENOUS
  Filled 2022-01-18: qty 2

## 2022-01-18 MED ORDER — TERBUTALINE SULFATE 1 MG/ML IJ SOLN: 0.2500 mg | Freq: Once | INTRAMUSCULAR | Status: DC | PRN

## 2022-01-18 MED ORDER — OXYCODONE-ACETAMINOPHEN 5-325 MG PO TABS: 2.0000 | ORAL_TABLET | ORAL | Status: DC | PRN

## 2022-01-18 MED ORDER — EPHEDRINE 5 MG/ML INJ: 10.0000 mg | INTRAVENOUS | Status: DC | PRN

## 2022-01-18 MED ORDER — METHIMAZOLE 5 MG PO TABS
5.0000 mg | ORAL_TABLET | Freq: Two times a day (BID) | ORAL | Status: DC
  Administered 2022-01-18 – 2022-01-20 (×5): 5 mg via ORAL
  Filled 2022-01-18 (×7): qty 1

## 2022-01-18 MED ORDER — FLEET ENEMA 7-19 GM/118ML RE ENEM
1.0000 | ENEMA | RECTAL | Status: DC | PRN
Start: 2022-01-18 — End: 2022-01-19

## 2022-01-18 MED ORDER — SOD CITRATE-CITRIC ACID 500-334 MG/5ML PO SOLN: 30.0000 mL | ORAL | Status: DC | PRN

## 2022-01-18 MED ORDER — FENTANYL CITRATE (PF) 100 MCG/2ML IJ SOLN
100.0000 ug | INTRAMUSCULAR | Status: DC | PRN
  Administered 2022-01-18 (×2): 100 ug via INTRAVENOUS
  Filled 2022-01-18 (×2): qty 2

## 2022-01-18 MED ORDER — LACTATED RINGERS IV SOLN: 500.0000 mL | INTRAVENOUS | Status: DC | PRN

## 2022-01-18 MED ORDER — MISOPROSTOL 50MCG HALF TABLET
50.0000 ug | ORAL_TABLET | ORAL | Status: DC | PRN
  Administered 2022-01-18: 50 ug via BUCCAL
  Filled 2022-01-18: qty 1

## 2022-01-18 MED ORDER — PHENYLEPHRINE 80 MCG/ML (10ML) SYRINGE FOR IV PUSH (FOR BLOOD PRESSURE SUPPORT): 80.0000 ug | PREFILLED_SYRINGE | INTRAVENOUS | Status: DC | PRN

## 2022-01-18 MED ORDER — LACTATED RINGERS IV SOLN: INTRAVENOUS | Status: DC

## 2022-01-18 MED ORDER — ACETAMINOPHEN 500 MG PO TABS
1000.0000 mg | ORAL_TABLET | Freq: Four times a day (QID) | ORAL | Status: DC | PRN
Start: 2022-01-18 — End: 2022-01-20
  Administered 2022-01-18 – 2022-01-19 (×3): 1000 mg via ORAL
  Filled 2022-01-18 (×3): qty 2

## 2022-01-18 MED ORDER — OXYCODONE-ACETAMINOPHEN 5-325 MG PO TABS: 1.0000 | ORAL_TABLET | ORAL | Status: DC | PRN

## 2022-01-18 MED ORDER — FENTANYL-BUPIVACAINE-NACL 0.5-0.125-0.9 MG/250ML-% EP SOLN
12.0000 mL/h | EPIDURAL | Status: DC | PRN
  Administered 2022-01-18: 12 mL/h via EPIDURAL

## 2022-01-18 MED ORDER — LIDOCAINE HCL (PF) 1 % IJ SOLN: 30.0000 mL | INTRAMUSCULAR | Status: DC | PRN

## 2022-01-18 MED ORDER — OXYTOCIN BOLUS FROM INFUSION
333.0000 mL | Freq: Once | INTRAVENOUS | Status: AC
  Administered 2022-01-19: 333 mL via INTRAVENOUS

## 2022-01-18 MED ORDER — LACTATED RINGERS IV SOLN: 500.0000 mL | Freq: Once | INTRAVENOUS | Status: DC

## 2022-01-18 MED ORDER — DIPHENHYDRAMINE HCL 50 MG/ML IJ SOLN: 12.5000 mg | INTRAMUSCULAR | Status: DC | PRN

## 2022-01-18 MED ORDER — OXYTOCIN-SODIUM CHLORIDE 30-0.9 UT/500ML-% IV SOLN: 2.5000 [IU]/h | INTRAVENOUS | Status: DC

## 2022-01-18 MED ORDER — FENTANYL-BUPIVACAINE-NACL 0.5-0.125-0.9 MG/250ML-% EP SOLN
EPIDURAL | Status: AC
  Filled 2022-01-18: qty 250

## 2022-01-18 MED ORDER — OXYTOCIN-SODIUM CHLORIDE 30-0.9 UT/500ML-% IV SOLN
1.0000 m[IU]/min | INTRAVENOUS | Status: DC
  Administered 2022-01-18: 2 m[IU]/min via INTRAVENOUS
  Filled 2022-01-18: qty 500

## 2022-01-18 MED ORDER — MISOPROSTOL 25 MCG QUARTER TABLET
25.0000 ug | ORAL_TABLET | ORAL | Status: DC | PRN
  Administered 2022-01-18: 25 ug via VAGINAL
  Filled 2022-01-18: qty 1

## 2022-01-18 MED ORDER — ACETAMINOPHEN 325 MG PO TABS
650.0000 mg | ORAL_TABLET | ORAL | Status: DC | PRN
Start: 2022-01-18 — End: 2022-01-18

## 2022-01-18 MED ORDER — LIDOCAINE HCL (PF) 1 % IJ SOLN
INTRAMUSCULAR | Status: DC | PRN
  Administered 2022-01-18: 6 mL via EPIDURAL
  Administered 2022-01-18: 4 mL via EPIDURAL

## 2022-01-18 NOTE — Progress Notes (Signed)
Labor Progress Note April Benson is a 26 y.o. G1P0 at [redacted]w[redacted]d presented for IOL for uncontrolled hyperthyroidism  S:  Doing well. Patient is starting to feel some more cramping.  O:  BP 138/81   Pulse 99   Temp 98.4 F (36.9 C) (Oral)   Ht 5\' 3"  (1.6 m)   Wt 93.4 kg   LMP 05/04/2021   BMI 36.49 kg/m  EFM: 150/moderate/+accels/no decels  CVE: Dilation: 2 Effacement (%): 50, 60 Station: -3 Presentation: Vertex Exam by:: Dr.Laniesha    [name]: 26 y.o. G1P0 at [redacted]w[redacted]d presented for IOL for uncontrolled hyperthyroidism #Labor: Doing well. S/p Cytotec x1. Cervix still ~2 cm. Cooks placed during current check and will give dose of Cytotec PO [redacted]w[redacted]d when contractions space out #Pain: Plans epidural #FWB: Cat I #GBS negative  , MD, MPH OB Fellow, Faculty Practice

## 2022-01-18 NOTE — H&P (Signed)
OBSTETRIC ADMISSION HISTORY AND PHYSICAL  April Benson is a 26 y.o. female G1P0 with IUP at [redacted]w[redacted]d by LMP/confirmed by [place] presenting for IOL for Hyperthyroidism. She reports +FMs, No LOF, no VB, no blurry vision, headaches or peripheral edema, and RUQ pain.  She plans on bottle/formula feeding. She request POPs for birth control. She received her prenatal care at  [facility]    Dating: By LMP --->  Estimated Date of Delivery: 02/08/22  Sono:   @[redacted]w[redacted]d , CWD, normal anatomy, cephalic presentation, anterior placental lie, 3339g, 93% EFW   Prenatal History/Complications:  Graves Disease  Past Medical History: Past Medical History:  Diagnosis Date   Hypertension    Hyperthyroidism    Tachycardia     Past Surgical History: Past Surgical History:  Procedure Laterality Date   NO PAST SURGERIES      Obstetrical History: OB History     Gravida  1   Para      Term      Preterm      AB      Living         SAB      IAB      Ectopic      Multiple      Live Births  0           Social History Social History   Socioeconomic History   Marital status: Married    Spouse name: Not on file   Number of children: Not on file   Years of education: Not on file   Highest education level: Not on file  Occupational History   Not on file  Tobacco Use   Smoking status: Never   Smokeless tobacco: Never  Vaping Use   Vaping Use: Never used  Substance and Sexual Activity   Alcohol use: Not Currently   Drug use: Never   Sexual activity: Yes  Other Topics Concern   Not on file  Social History Narrative   ** Merged History Encounter **       Social Determinants of Health   Financial Resource Strain: Not on file  Food Insecurity: Not on file  Transportation Needs: Not on file  Physical Activity: Not on file  Stress: Not on file  Social Connections: Not on file    Family History: Family History  Problem Relation Age of Onset   Hypertension Mother    Diabetes Mother     Hypothyroidism Mother    ' disease Mother     Allergies: No Known Allergies  Medications Prior to Admission  Medication Sig Dispense Refill Last Dose   aspirin EC 81 MG tablet Take 1 tablet (81 mg total) by mouth daily. Take after 12 weeks for prevention of preeclampsia later in pregnancy 300 tablet 2    cyclobenzaprine (FLEXERIL) 10 MG tablet Take 1 tablet (10 mg total) by mouth 3 (three) times daily as needed for muscle spasms. 30 tablet 2    diphenhydrAMINE (BENADRYL) 25 mg capsule Take 25 mg by mouth every 6 (six) hours as needed.      famotidine (PEPCID) 20 MG tablet Take 1 tablet (20 mg total) by mouth 2 (two) times daily. 30 tablet 0    methimazole (TAPAZOLE) 10 MG tablet Take 10 mg by mouth 3 (three) times daily.      metoprolol succinate (TOPROL-XL) 25 MG 24 hr tablet Take 1 tablet (25 mg total) by mouth daily. 14 tablet 0    ondansetron (ZOFRAN-ODT) 8 MG disintegrating tablet  Take 1 tablet (8 mg total) by mouth every 8 (eight) hours as needed for nausea or vomiting. 90 tablet 0    Prenatal Vit-Fe Fumarate-FA (PRENATAL MULTIVITAMIN) TABS tablet Take 1 tablet by mouth daily at 12 noon.      triamcinolone cream (KENALOG) 0.1 % Apply topically 3 (three) times daily as needed (rash, itching). 30 g 0      Review of Systems   All systems reviewed and negative except as stated in HPI  Blood pressure 120/74, pulse (!) 132, temperature 98.8 F (37.1 C), temperature source Oral, last menstrual period 05/04/2021. General appearance: alert Lungs: clear to auscultation bilaterally Heart: regular rate and rhythm Abdomen: soft, non-tender; bowel sounds normal Extremities: Homans sign is negative, no sign of DVT Presentation: cephalic Fetal monitoring Baseline: 140 bpm, Variability: Good {> 6 bpm), Accelerations: Reactive, and Decelerations: Absent Uterine activity irregular     Prenatal labs: ABO, Rh: A/Positive/-- (01/05 0000) Antibody: Negative (01/05 0000) Rubella:  Immune (01/05 0000) RPR: NON-REACTIVE (05/01 0817)  HBsAg: Negative (01/05 0000)  HIV: NON-REACTIVE (05/01 0817)  GBS: Negative/-- (06/26 0000)  2 hr Glucola normal Genetic screening  LR NIPS Anatomy US normal  Prenatal Transfer Tool  Maternal Diabetes: No Genetic Screening: Normal Maternal Ultrasounds/Referrals: Normal Fetal Ultrasounds or other Referrals:  None Maternal Substance Abuse:  No Significant Maternal Medications:  None Significant Maternal Lab Results: Group B Strep negative  No results found for this or any previous visit (from the past 24 hour(s)).  Patient Active Problem List   Diagnosis Date Noted   Hyperthyroidism complicating pregnancy 01/18/2022   Supervision of high-risk pregnancy 08/09/2021   Hyperthyroidism in pregnancy, antepartum 08/09/2021   Nausea/vomiting in pregnancy 08/09/2021    Assessment/Plan:  April Benson is a 26 y.o. G1P0 at [redacted]w[redacted]d here for IOL for hyperthyroidism (not well controlled)  #Labor: patient's cervix 2 cm at admission. Started with PV cytotec . At next check will assess for balloon placement. #Pain: epidural #FWB: Cat I #ID:  GBS negative #MOF: breast #MOC: POPs #Circ:  female  #Hyperthyroidism Previous on Methimazole. Last check in 11/2021 - TSH below <0.005, total T4 18.0 (high - previously 23), T3 uptake 18(low). Patient previously on Methimazole and Toprol. Methimazole stopped per patient by endocrinologist two weeks ago at 35 weeks. Today feels palpitations and has a slight headache. Otherwise feels well - Continue patients Toprol BID - Will restart Methimazole - Plan for 6 week f/up with endocrinology  #?Elevated BP Patient reports she has had elevated BP at times but only in setting of uncontrolled hyperthyroidism. Will monitor for signs and symptoms of gHTN   Warner Mccreedy, MD, MPH OB Fellow, Faculty Practice

## 2022-01-18 NOTE — Anesthesia Preprocedure Evaluation (Signed)
Anesthesia Evaluation  Patient identified by MRN, date of birth, ID band Patient awake    Reviewed: Allergy & Precautions, H&P , NPO status , Patient's Chart, lab work & pertinent test results  History of Anesthesia Complications Negative for: history of anesthetic complications  Airway Mallampati: II  TM Distance: >3 FB     Dental   Pulmonary neg pulmonary ROS,    Pulmonary exam normal        Cardiovascular hypertension,  Rhythm:regular Rate:Normal     Neuro/Psych negative neurological ROS  negative psych ROS   GI/Hepatic negative GI ROS, Neg liver ROS,   Endo/Other  Hyperthyroidism   Renal/GU negative Renal ROS  negative genitourinary   Musculoskeletal   Abdominal   Peds  Hematology negative hematology ROS (+)   Anesthesia Other Findings   Reproductive/Obstetrics (+) Pregnancy                             Anesthesia Physical Anesthesia Plan  ASA: 2  Anesthesia Plan: Epidural   Post-op Pain Management:    Induction:   PONV Risk Score and Plan:   Airway Management Planned:   Additional Equipment:   Intra-op Plan:   Post-operative Plan:   Informed Consent: I have reviewed the patients History and Physical, chart, labs and discussed the procedure including the risks, benefits and alternatives for the proposed anesthesia with the patient or authorized representative who has indicated his/her understanding and acceptance.       Plan Discussed with:   Anesthesia Plan Comments:         Anesthesia Quick Evaluation

## 2022-01-18 NOTE — Progress Notes (Signed)
Labor Progress Note Zyra RONNITA PAZ is a 26 y.o. G1P0 at [redacted]w[redacted]d presented for IOL due to uncontrolled hyperthyroidism.   S: Doing well. Comfortable with epidural. No concerns. Family at bedside.   O:  BP 125/68   Pulse (!) 105   Temp 98.3 F (36.8 C) (Oral)   Resp 16   Ht 5\' 3"  (1.6 m)   Wt 93.4 kg   LMP 05/04/2021   SpO2 98%   BMI 36.49 kg/m   EFM: Baseline 135 bpm, moderate variability, + accels, no decels  Toco: Every 2-5 minutes   CVE: Dilation: 4 Effacement (%): 50 Station: -2 Presentation: Vertex Exam by:: Dr [email or web address]  [name]: 26 y.o. G1P0 [redacted]w[redacted]d   #Labor: Progressing well. AROM performed at 2250 with clear fluid after verbal consent. Mom and baby tolerated this well. Contracting irregularly. Will start Pitocin 2x2 and reassess in 4-5 hours.  #Pain: Epidural  #FWB: Cat 1  #GBS negative  [redacted]w[redacted]d, MD 11:21 PM

## 2022-01-18 NOTE — Anesthesia Procedure Notes (Signed)
Epidural Patient location during procedure: OB Start time: 01/18/2022 9:37 PM End time: 01/18/2022 9:51 PM  Staffing Anesthesiologist: Lucretia Kern, MD Performed: anesthesiologist   Preanesthetic Checklist Completed: patient identified, IV checked, risks and benefits discussed, monitors and equipment checked, pre-op evaluation and timeout performed  Epidural Patient position: sitting Prep: DuraPrep Patient monitoring: heart rate, continuous pulse ox and blood pressure Approach: midline Location: L3-L4 Injection technique: LOR air  Needle:  Needle type: Tuohy  Needle gauge: 17 G Needle length: 9 cm Needle insertion depth: 7 cm Catheter type: closed end flexible Catheter size: 19 Gauge Catheter at skin depth: 12 cm Test dose: negative  Assessment Events: blood not aspirated, injection not painful, no injection resistance, no paresthesia and negative IV test  Additional Notes Reason for block:procedure for pain

## 2022-01-19 DIAGNOSIS — O139 Gestational [pregnancy-induced] hypertension without significant proteinuria, unspecified trimester: Secondary | ICD-10-CM | POA: Diagnosis not present

## 2022-01-19 DIAGNOSIS — Z8759 Personal history of other complications of pregnancy, childbirth and the puerperium: Secondary | ICD-10-CM | POA: Diagnosis not present

## 2022-01-19 DIAGNOSIS — O99284 Endocrine, nutritional and metabolic diseases complicating childbirth: Secondary | ICD-10-CM

## 2022-01-19 DIAGNOSIS — O134 Gestational [pregnancy-induced] hypertension without significant proteinuria, complicating childbirth: Secondary | ICD-10-CM

## 2022-01-19 DIAGNOSIS — Z3A37 37 weeks gestation of pregnancy: Secondary | ICD-10-CM

## 2022-01-19 LAB — COMPREHENSIVE METABOLIC PANEL
ALT: 21 U/L (ref 0–44)
AST: 18 U/L (ref 15–41)
Albumin: 2.7 g/dL — ABNORMAL LOW (ref 3.5–5.0)
Alkaline Phosphatase: 117 U/L (ref 38–126)
Anion gap: 6 (ref 5–15)
BUN: <5 mg/dL — ABNORMAL LOW (ref 6–20)
CO2: 22 mmol/L (ref 22–32)
Calcium: 9.2 mg/dL (ref 8.9–10.3)
Chloride: 109 mmol/L (ref 98–111)
Creatinine, Ser: 0.46 mg/dL (ref 0.44–1.00)
GFR, Estimated: >60 mL/min (ref >60)
Glucose, Bld: 99 mg/dL (ref 70–99)
Potassium: 3.8 mmol/L (ref 3.5–5.1)
Sodium: 137 mmol/L (ref 135–145)
Total Bilirubin: 0.5 mg/dL (ref 0.3–1.2)
Total Protein: 6.5 g/dL (ref 6.5–8.1)

## 2022-01-19 LAB — CBC
HCT: 32.5 % — ABNORMAL LOW (ref 36.0–46.0)
Hemoglobin: 10.4 g/dL — ABNORMAL LOW (ref 12.0–15.0)
MCH: 26.8 pg (ref 26.0–34.0)
MCHC: 32 g/dL (ref 30.0–36.0)
MCV: 83.8 fL (ref 80.0–100.0)
Platelets: 206 10*3/uL (ref 150–400)
RBC: 3.88 MIL/uL (ref 3.87–5.11)
RDW: 13.5 % (ref 11.5–15.5)
WBC: 12.9 10*3/uL — ABNORMAL HIGH (ref 4.0–10.5)
nRBC: 0 % (ref 0.0–0.2)

## 2022-01-19 LAB — RPR: RPR Ser Ql: NONREACTIVE

## 2022-01-19 LAB — PROTEIN / CREATININE RATIO, URINE
Creatinine, Urine: 45.87 mg/dL
Protein Creatinine Ratio: 0.22 mg/mg{Cre} — ABNORMAL HIGH (ref 0.00–0.15)
Total Protein, Urine: 10 mg/dL

## 2022-01-19 MED ORDER — SIMETHICONE 80 MG PO CHEW: 80.0000 mg | CHEWABLE_TABLET | ORAL | Status: DC | PRN

## 2022-01-19 MED ORDER — METOPROLOL SUCCINATE ER 25 MG PO TB24
25.0000 mg | ORAL_TABLET | Freq: Every day | ORAL | Status: DC
  Administered 2022-01-19 – 2022-01-20 (×2): 25 mg via ORAL
  Filled 2022-01-19 (×3): qty 1

## 2022-01-19 MED ORDER — BENZOCAINE-MENTHOL 20-0.5 % EX AERO: 1.0000 | INHALATION_SPRAY | CUTANEOUS | Status: DC | PRN

## 2022-01-19 MED ORDER — OXYCODONE HCL 5 MG PO TABS: 10.0000 mg | ORAL_TABLET | ORAL | Status: DC | PRN

## 2022-01-19 MED ORDER — METOPROLOL SUCCINATE ER 25 MG PO TB24
25.0000 mg | ORAL_TABLET | Freq: Every day | ORAL | Status: DC
Start: 2022-01-19 — End: 2022-01-19

## 2022-01-19 MED ORDER — DIPHENHYDRAMINE HCL 25 MG PO CAPS: 25.0000 mg | ORAL_CAPSULE | Freq: Four times a day (QID) | ORAL | Status: DC | PRN

## 2022-01-19 MED ORDER — COCONUT OIL OIL: 1.0000 | TOPICAL_OIL | Status: DC | PRN

## 2022-01-19 MED ORDER — ONDANSETRON HCL 4 MG PO TABS: 4.0000 mg | ORAL_TABLET | ORAL | Status: DC | PRN

## 2022-01-19 MED ORDER — WITCH HAZEL-GLYCERIN EX PADS: 1.0000 | MEDICATED_PAD | CUTANEOUS | Status: DC | PRN

## 2022-01-19 MED ORDER — OXYCODONE HCL 5 MG PO TABS: 5.0000 mg | ORAL_TABLET | ORAL | Status: DC | PRN

## 2022-01-19 MED ORDER — SENNOSIDES-DOCUSATE SODIUM 8.6-50 MG PO TABS
2.0000 | ORAL_TABLET | Freq: Every day | ORAL | Status: DC
  Administered 2022-01-20: 2 via ORAL
  Filled 2022-01-19: qty 2

## 2022-01-19 MED ORDER — IBUPROFEN 600 MG PO TABS
600.0000 mg | ORAL_TABLET | Freq: Four times a day (QID) | ORAL | Status: DC
  Administered 2022-01-19 – 2022-01-20 (×5): 600 mg via ORAL
  Filled 2022-01-19 (×5): qty 1

## 2022-01-19 MED ORDER — DIBUCAINE (PERIANAL) 1 % EX OINT: 1.0000 | TOPICAL_OINTMENT | CUTANEOUS | Status: DC | PRN

## 2022-01-19 MED ORDER — PRENATAL MULTIVITAMIN CH
1.0000 | ORAL_TABLET | Freq: Every day | ORAL | Status: DC
  Administered 2022-01-19 – 2022-01-20 (×2): 1 via ORAL
  Filled 2022-01-19 (×2): qty 1

## 2022-01-19 MED ORDER — ONDANSETRON HCL 4 MG/2ML IJ SOLN: 4.0000 mg | INTRAMUSCULAR | Status: DC | PRN

## 2022-01-19 MED ORDER — ACETAMINOPHEN 325 MG PO TABS
650.0000 mg | ORAL_TABLET | ORAL | Status: DC | PRN
Start: 2022-01-19 — End: 2022-01-19

## 2022-01-19 NOTE — Progress Notes (Signed)
Labor Progress Note April Benson is a 26 y.o. G1P0 at [redacted]w[redacted]d who presented for IOL due to uncontrolled hyperthyroidism.   S: Doing well. No concerns per RN.   O:  BP (!) 146/84   Pulse (!) 114   Temp 98.2 F (36.8 C) (Oral)   Resp 18   Ht 5\' 3"  (1.6 m)   Wt 93.4 kg   LMP 05/04/2021   SpO2 98%   BMI 36.49 kg/m   EFM: Baseline 145 bpm, minimal to moderate variability, + accels, no decels  Toco: Every 1-4 minutes   CVE: Dilation: 4 Effacement (%): 60, 70 Station: -2 Presentation: Vertex Exam by:: [email or web address]  [name]: 26 y.o. G1P0 [redacted]w[redacted]d   #Labor: SVE with similar dilation per RN but more effaced. Pitocin currently at 12 milli-units/min. Contraction pattern improving but remains irregular. Will continue to titrate Pitocin and reassess in 3-4 hours.  #Pain: Epidural  #FWB: Cat 1  #GBS negative  #Elevated BP: Persistent mild range BP since last check. CBC, CMP, and urine P:C ratio ordered. Will continue to monitor closely and follow up results.   [redacted]w[redacted]d, MD 4:08 AM

## 2022-01-19 NOTE — Lactation Note (Signed)
This note was copied from a baby's chart. Lactation Consultation Note  Patient Name: April Benson Today's Date: 01/19/2022   Age:28 hours  LC spoke with Ped regarding April Benson breastfeeding while taking her 2 meds for hyperthyroidism, Metoprolol succinate and Methimazole.  Both are level L2 per Dr. Sheffield Slider Medication and Mother's milk, which means Limited Data but probably compatible with infant thyroid function monitoring.  April Benson reports to Ped that she plans to formula feed her baby.  April Benson was told by Dr. Mayford Knife (Ped) of lactation support available for her and it would ok for her to choose to breastfeed.   Consult Status Consult Status: Complete    Judee Clara RN [facility] 01/19/2022, 9:05 AM

## 2022-01-19 NOTE — Discharge Summary (Signed)
Postpartum Discharge Summary  Date of Service updated***  Patient Name: April Benson DOB: [birth year] MRN: [id number]  Date of admission: 01/18/2022 Delivery date:01/19/2022  Delivering provider: Genia Del  Date of discharge: 01/19/2022  Admitting diagnosis: Hyperthyroidism complicating pregnancy [N39.767, E05.90] Intrauterine pregnancy: [redacted]w[redacted]d    Secondary diagnosis:  Principal Problem:   Hyperthyroidism complicating pregnancy Active Problems:   Supervision of high-risk pregnancy   Hyperthyroidism in pregnancy, antepartum   Nausea/vomiting in pregnancy  Additional problems: ***    Discharge diagnosis: Term Pregnancy Delivered                                              Post partum procedures:{Postpartum procedures:23558} Augmentation: AROM, Pitocin, Cytotec, and IP Foley Complications: None  Hospital course: Induction of Labor With Vaginal Delivery   26y.o. yo G1P0 at 363w1das admitted to the hospital 01/18/2022 for induction of labor.  Indication for induction: Uncontrolled hyperthyroidism. Patient had an uncomplicated labor course and vaginal delivery.  Membrane Rupture Time/Date: 10:50 PM ,01/18/2022   Delivery Method:Vaginal, Spontaneous  Episiotomy:   Lacerations:  1st degree;Labial;Periurethral  Details of delivery can be found in separate delivery note.  Patient had a routine postpartum course.  She is eating, drinking, voiding, and ambulating without issue.  Her pain and bleeding are controlled.  She is ***feeding well.  Patient is discharged home 01/19/22.  Newborn Data: Birth date:01/19/2022  Birth time:6:36 AM  Gender:Female  Living status:Living  Apgars:8 ,9  Weight:3100 g   Magnesium Sulfate received: No BMZ received: No Rhophylac: N/A MMR: N/A T-DaP: Given prenatally Flu: Given prenatally  Transfusion: No  Physical exam  Vitals:   01/19/22 0720 01/19/22 0730 01/19/22 0812 01/19/22 0820  BP:  123/75  100/63  Pulse:  (!) 108  (!) 120   Resp:  18  20  Temp: (!) 101.2 F (38.4 C)  100.1 F (37.8 C) 98.7 F (37.1 C)  TempSrc: Axillary  Axillary Oral  SpO2:    98%  Weight:      Height:       General: {Exam; general:21111117} Lochia: {Desc; appropriate/inappropriate:30686::"appropriate"} Uterine Fundus: {Desc; firm/soft:30687} Incision: {Exam; incision:21111123} DVT Evaluation: {Exam; dvHAL:9379024}Labs: Lab Results  Component Value Date   WBC 12.9 (H) 01/19/2022   HGB 10.4 (L) 01/19/2022   HCT 32.5 (L) 01/19/2022   MCV 83.8 01/19/2022   PLT 206 01/19/2022      Latest Ref Rng & Units 01/19/2022    4:24 AM  CMP  Glucose 70 - 99 mg/dL 99   BUN 6 - 20 mg/dL <5   Creatinine 0.44 - 1.00 mg/dL 0.46   Sodium 135 - 145 mmol/L 137   Potassium 3.5 - 5.1 mmol/L 3.8   Chloride 98 - 111 mmol/L 109   CO2 22 - 32 mmol/L 22   Calcium 8.9 - 10.3 mg/dL 9.2   Total Protein 6.5 - 8.1 g/dL 6.5   Total Bilirubin 0.3 - 1.2 mg/dL 0.5   Alkaline Phos 38 - 126 U/L 117   AST 15 - 41 U/L 18   ALT 0 - 44 U/L 21    Edinburgh Score:     No data to display          After visit meds:  Allergies as of 01/19/2022   No Known Allergies   Med Rec must be completed  prior to using this [facility]***       Discharge home in stable condition Infant Feeding: {Baby feeding:23562} Infant Disposition: {CHL IP OB HOME WITH RJGYLU:94370} Discharge instruction: per After Visit Summary and Postpartum booklet. Activity: Advance as tolerated. Pelvic rest for 6 weeks.  Diet: routine diet Future Appointments: Future Appointments  Date Time Provider [place]  01/21/2022  2:50 PM Guss Bunde, MD [facility]  01/25/2022 10:15 AM [facility] NURSE [facility]  01/25/2022 10:30 AM [facility] US2 [facility]  01/28/2022  3:50 PM Guss Bunde, MD [facility]  02/01/2022  9:30 AM [facility] NURSE [facility]  02/01/2022  9:45 AM [facility] US5 [facility]  02/04/2022  2:30 PM Radene Gunning, MD [facility]   02/11/2022  1:50 PM Gala Romney Fredderick Phenix, MD [facility]   Follow up Visit: Message sent to [place] by Dr. Gwenlyn Perking on 01/19/22.   Please schedule this patient for a In person postpartum visit in 6 weeks with the following provider: MD. Additional Postpartum F/U: BP check 1 week  High risk pregnancy complicated by: Hyperthyroidism, intrapartum gHTN Delivery mode:  Vaginal, Spontaneous  Anticipated Birth Control:  POPs  01/19/2022 Genia Del, MD

## 2022-01-20 LAB — T3, FREE: T3, Free: 8.5 pg/mL — ABNORMAL HIGH (ref 2.0–4.4)

## 2022-01-20 LAB — T3: T3, Total: 479 ng/dL — ABNORMAL HIGH (ref 71–180)

## 2022-01-20 MED ORDER — ACETAMINOPHEN 500 MG PO TABS: 1000.0000 mg | ORAL_TABLET | Freq: Three times a day (TID) | ORAL | 0 refills | Status: DC | PRN

## 2022-01-20 MED ORDER — METHIMAZOLE 5 MG PO TABS: 5.0000 mg | ORAL_TABLET | Freq: Two times a day (BID) | ORAL | Status: DC

## 2022-01-20 MED ORDER — IBUPROFEN 600 MG PO TABS: 600.0000 mg | ORAL_TABLET | Freq: Four times a day (QID) | ORAL | 0 refills | Status: DC | PRN

## 2022-01-20 MED ORDER — FUROSEMIDE 20 MG PO TABS: 20.0000 mg | ORAL_TABLET | Freq: Every day | ORAL | 0 refills | Status: DC

## 2022-01-20 NOTE — Anesthesia Postprocedure Evaluation (Signed)
Anesthesia Post Note  Patient: April Benson  Procedure(s) Performed: AN AD HOC LABOR EPIDURAL     Patient location during evaluation: Mother Baby Anesthesia Type: Epidural Level of consciousness: awake Pain management: satisfactory to patient Vital Signs Assessment: post-procedure vital signs reviewed and stable Respiratory status: spontaneous breathing Cardiovascular status: stable Anesthetic complications: no   No notable events documented.  Last Vitals:  Vitals:   01/19/22 1818 01/19/22 2133  BP: 118/79 108/67  Pulse: 100 98  Resp: 20 18  Temp: 36.7 C 36.6 C  SpO2: 98% 99%    Last Pain:  Vitals:   01/20/22 0816  TempSrc:   PainSc: 4    Pain Goal:                   Cephus Shelling

## 2022-01-21 ENCOUNTER — Encounter: Payer: Medicaid Other | Admitting: Obstetrics & Gynecology

## 2022-01-24 ENCOUNTER — Encounter (HOSPITAL_COMMUNITY): Payer: Self-pay | Admitting: Obstetrics & Gynecology

## 2022-01-25 ENCOUNTER — Ambulatory Visit: Payer: Medicaid Other

## 2022-01-28 ENCOUNTER — Encounter: Payer: Medicaid Other | Admitting: Obstetrics & Gynecology

## 2022-01-28 ENCOUNTER — Ambulatory Visit (INDEPENDENT_AMBULATORY_CARE_PROVIDER_SITE_OTHER): Payer: Medicaid Other | Admitting: *Deleted

## 2022-01-28 DIAGNOSIS — Z0130 Encounter for examination of blood pressure without abnormal findings: Secondary | ICD-10-CM

## 2022-01-28 NOTE — Progress Notes (Cosign Needed)
One week  PP BP check.  125/77 P-101.  Pt to continue her BP meds.  She does desire to start OCP's at her PP visit.  She is not breastfeeding.

## 2022-02-01 ENCOUNTER — Ambulatory Visit: Payer: Medicaid Other

## 2022-02-04 ENCOUNTER — Encounter: Payer: Medicaid Other | Admitting: Obstetrics and Gynecology

## 2022-02-11 ENCOUNTER — Encounter: Payer: Medicaid Other | Admitting: Obstetrics & Gynecology

## 2022-03-06 NOTE — Progress Notes (Unsigned)
Post Partum Visit Note  April Benson is a 26 y.o. G40P1001 female who presents for a postpartum visit. She is 6 weeks postpartum following a normal spontaneous vaginal delivery.  I have fully reviewed the prenatal and intrapartum course. The delivery was at 37.1 gestational weeks.  Anesthesia: epidural. Postpartum course has been unremarkable. Baby is doing well. Baby is feeding by bottle - Gerber Gentle . Bleeding no bleeding. Bowel function is normal. Bladder function is normal. Patient is not sexually active. Contraception method is OCP (estrogen/progesterone). Postpartum depression screening: positive-score 14. Patient denies depressive symptoms but admits to feeling very anxious. She worries that something may happen to her newborn when she goes out. She has the support of her husband who will be returning to work next week   The pregnancy intention screening data noted above was reviewed. Potential methods of contraception were discussed. The patient elected to proceed with POP.   Edinburgh Postnatal Depression Scale - 03/07/22 0747       Edinburgh Postnatal Depression Scale:  In the Past 7 Days   I have been able to laugh and see the funny side of things. 1    I have looked forward with enjoyment to things. 1    I have blamed myself unnecessarily when things went wrong. 2    I have been anxious or worried for no good reason. 3    I have felt scared or panicky for no good reason. 3    Things have been getting on top of me. 2    I have been so unhappy that I have had difficulty sleeping. 0    I have felt sad or miserable. 1    I have been so unhappy that I have been crying. 1    The thought of harming myself has occurred to me. 0    Edinburgh Postnatal Depression Scale Total 14             Health Maintenance Due  Topic Date Due   COVID-19 Vaccine (1) Never done   PAP-Cervical Cytology Screening  Never done   PAP SMEAR-Modifier  Never done   INFLUENZA VACCINE  02/19/2022        Review of Systems Pertinent items noted in HPI and remainder of comprehensive ROS otherwise negative.  Objective:  BP 135/88   Pulse (!) 103   Ht 5\' 3"  (1.6 m)   Wt 199 lb (90.3 kg)   LMP 05/04/2021   BMI 35.25 kg/m    General:  alert, cooperative, and no distress   Breasts:  normal  Lungs: clear to auscultation bilaterally  Heart:  regular rate and rhythm  Abdomen: soft, non-tender; bowel sounds normal; no masses,  no organomegaly   Wound N/a  GU exam:  not indicated       Assessment:   Normal postpartum exam.   Plan:   Essential components of care per ACOG recommendations:  1.  Mood and well being: Patient with positive depression screening today. Reviewed local resources for support.  - Patient tobacco use? No.   - hx of drug use? No.    2. Infant care and feeding:  -Patient currently breastmilk feeding? No.  -Social determinants of health (SDOH) reviewed in EPIC. No concerns  3. Sexuality, contraception and birth spacing - Patient does not want a pregnancy in the next year.  Desired family size is 2 children.  - Reviewed reproductive life planning. Reviewed contraceptive methods based on pt preferences and effectiveness.  Patient desired Oral Contraceptive today.  Rx POP provided - Discussed birth spacing of 18 months  4. Sleep and fatigue -Encouraged family/partner/community support of 4 hrs of uninterrupted sleep to help with mood and fatigue  5. Physical Recovery  - Discussed patients delivery and complications. She describes her labor as good. - Patient had a Vaginal, no problems at delivery. Patient had a 1st degree laceration. Perineal healing reviewed. Patient expressed understanding - Patient has urinary incontinence? No. - Patient is safe to resume physical and sexual activity  6.  Health Maintenance - HM due items addressed Yes - Pap smear not done at today's visit.  -Breast Cancer screening indicated? No.   7. Chronic Disease/Pregnancy  Condition follow up: Hyperthyroidism  - PCP follow up  Catalina Antigua, MD [facility], [facility]

## 2022-03-07 ENCOUNTER — Encounter: Payer: Self-pay | Admitting: Obstetrics and Gynecology

## 2022-03-07 ENCOUNTER — Ambulatory Visit (INDEPENDENT_AMBULATORY_CARE_PROVIDER_SITE_OTHER): Payer: Medicaid Other | Admitting: Obstetrics and Gynecology

## 2022-03-07 DIAGNOSIS — F53 Postpartum depression: Secondary | ICD-10-CM

## 2022-03-07 MED ORDER — SLYND 4 MG PO TABS: 1.0000 | ORAL_TABLET | Freq: Every day | ORAL | 12 refills | Status: DC

## 2022-07-10 NOTE — ED Provider Notes (Signed)
 [facility]  ED Provider Note  Tele-Medical screening initiated and orders placed by Sammi DELENA Blanch, MD. 07/10/2022 / 1:14 PM  26 y.o. female reports 3 wks preg (by LMP) presents with palpitations, chest pain/tightness, and sob started approx 12pm today. States was sitting on the coach when it started, denies any symptoms this morning. Denies lightheadedness or loc. Denies abdominal pain, vaginal bleeding, or n/v. Hx of hyperthyroid, on metropolol and methimazole . Recent pregnancy and vaginal delivery on 12/2021.  Tachycardia and elv BP noted. No respiratory distress.  Patient seen and received a tele-medical screening examination in triage.  The provider performing the medical screening exam was not located at the facility and was located remotely.  Patient understands that the provider is seeing them remotely and consents to the exam.  Additionally, the patient has been advised of the risks and benefits of a video visit that differ from in-person treatment such as: a limited physical examination, unforeseen disruptions to connectivity, risks to patient confidentiality and privacy. Patient was also explained the risks of the exam which include video or sound dysfunction, or minor portions of the exam performed in conjunction with ancillary staff in the ED.  Appropriate orders have been initiated based on my brief physical exam and HPI. Patient placed in appropriate area until a treatment room becomes available for further evaluation and management by the in-house provider.  This tele-medical screening exam was electronically signed by Sammi DELENA Blanch, MD on 07/10/2022 at 1:14 PM  Chief Complaint  Patient presents with  . Heart Palpitations    Pt reports she was sitting on the couch when she started having rapid heart palpitations.  Pt states she still feels them but they are not as severe as when they started. Pt reports hx of hyperthyroid and takes betablocker for  history of similar symptoms.  Pt states this episode is more severe.  Pt reports some midsternal CP as well.    April Benson is a 26 y.o. female with a past medical history of hypothyroidism who is G2P1 approximately [redacted] weeks gestation who presents with a chief complaint of heart palpitation, sternal chest pain and tightness that started around 12 PM today.  She has associated shortness of breath.  Patient states she is on methimazole  20 mg twice daily and metoprolol  25 mg extended release twice daily.  Her thyroid  medication has not been changed recently.  Patient report having a positive pregnancy test and states she likely is [redacted] weeks pregnant at this time.  She has not follow-up with OB/GYN yet.  She denies any fevers, chills, nausea, vomiting, abdominal pain, or vaginal bleeding with this episode. She also denies history of DVT/PE.   Review of Systems  Constitutional:  Negative for chills and fever.  HENT:  Negative for ear pain and sore throat.   Eyes:  Negative for pain and visual disturbance.  Respiratory:  Positive for shortness of breath. Negative for cough.   Cardiovascular:  Positive for chest pain and palpitations.  Gastrointestinal:  Negative for abdominal pain, nausea and vomiting.  Genitourinary:  Negative for dysuria and hematuria.  Musculoskeletal:  Negative for arthralgias and back pain.  Skin:  Negative for color change and rash.  Neurological:  Negative for seizures and syncope.  All other systems reviewed and are negative.  Past Medical History:  Diagnosis Date  . Disease of thyroid  gland    Past Surgical History:  Procedure Laterality Date  . No past surgeries  History reviewed. No pertinent family history.   Social History   Tobacco Use  . Smoking status: Never  . Smokeless tobacco: Never  Substance Use Topics  . Alcohol use: Not Currently   Patient's Medications  New Prescriptions   No medications on file  Previous Medications   FAMOTIDINE   (PEPCID ) 20 MG TABLET    Take one tablet (20 mg dose) by mouth 2 (two) times daily.   METHIMAZOLE  (TAPAZOLE ) 10 MG TABLET    Take 2 tablets by mouth twice daily   METOPROLOL  SUCCINATE (TOPROL -XL) 25 MG 24 HR TABLET    Take one tablet (25 mg dose) by mouth 2 (two) times daily.   ONDANSETRON  (ZOFRAN -ODT) 4 MG DISINTEGRATING TABLET    Take one tablet (4 mg dose) by mouth every 6 (six) hours as needed.   PROMETHAZINE  (PHENERGAN ,PHENADOZ) 25 MG SUPPOSITORY    Place one suppository (25 mg dose) rectally every 6 (six) hours as needed.   SCOPOLAMINE  (TRANSDERM-SCOP 1.5 MG) 1 MG/3DAYS PATCH    Place one patch onto the skin every third day.  Modified Medications   No medications on file  Discontinued Medications   No medications on file   No Known Allergies  PHYSICAL EXAM: ED Triage Vitals [07/10/22 1208]  Enc Vitals Group     BP (!) 163/97     Heart Rate 116     Resp 20     Temp 97.7 F (36.5 C)     Temp src Oral     SpO2 98 %     Weight 94.3 kg (208 lb)     Height 1.6 m (5' 3)     Head Circumference      Peak Flow      Pain Score Three     Pain Loc      Pain Edu?      Excl. in GC?    Physical Exam Vitals and nursing note reviewed.  Constitutional:      General: She is not in acute distress.    Appearance: She is well-developed. She is not ill-appearing or toxic-appearing.  HENT:     Head: Normocephalic and atraumatic.     Mouth/Throat:     Mouth: Mucous membranes are moist.     Pharynx: Oropharynx is clear.  Eyes:     Extraocular Movements: Extraocular movements intact.     Conjunctiva/sclera: Conjunctivae normal.     Pupils: Pupils are equal, round, and reactive to light.  Cardiovascular:     Rate and Rhythm: Regular rhythm. Tachycardia present.     Pulses: Normal pulses.     Heart sounds: Normal heart sounds. No murmur heard. Pulmonary:     Effort: Pulmonary effort is normal. No respiratory distress.     Breath sounds: Normal breath sounds. No wheezing.  Abdominal:      General: Bowel sounds are normal. There is no distension.     Palpations: Abdomen is soft.     Tenderness: There is no abdominal tenderness. There is no right CVA tenderness, left CVA tenderness, guarding or rebound.  Musculoskeletal:        General: No swelling.     Cervical back: Neck supple.  Skin:    General: Skin is warm and dry.     Capillary Refill: Capillary refill takes less than 2 seconds.  Neurological:     General: No focal deficit present.     Mental Status: She is alert and oriented to person, place, and time. Mental status is at baseline.  Psychiatric:        Mood and Affect: Mood normal.     DIAGNOSTICS:  Pulse Ox: Pulse oximetry 100% on RA indicating adequate oxygenation.  Imaging: CT Angio Pulmonary  Final Result  IMPRESSION:  1.  No acute cardiopulmonary abnormality. No acute pulmonary embolism.    Electronically Signed by: Valma Companion, MD on 07/10/2022 3:40 PM    XR Chest Pa And Lateral  Final Result  IMPRESSION:  1.  Low lung volumes with bibasilar opacities suggestive of atelectasis however an infectious/inflammatory process is difficult to exclude.     Electronically Signed by: Valma Companion, MD on 07/10/2022 12:58 PM    US  Ob < 14 Wks Transabdominal/Transvaginal    (Results Pending)   Labs: Labs Reviewed  CBC AND DIFFERENTIAL - Abnormal; Notable for the following components:      Result Value   RBC 4.98 (*)    MCV 80 (*)    MCH 25.5 (*)    Eosinophil % 0.0 (*)    All other components within normal limits  COMPREHENSIVE METABOLIC PANEL - Abnormal; Notable for the following components:   Na 135 (*)    Glucose 118 (*)    Creatinine 0.36 (*)    All other components within normal limits  HUMAN CHORIONIC GONADOTROPIN  (HCG), BETA-SUBUNIT, QUALITATIVE - Abnormal; Notable for the following components:   Beta HCG Qual Positive (*)    All other components within normal limits  GEN5 CARDIAC TROPONIN T(TNT5) 1 HOUR - Abnormal; Notable  for the following components:   Delta 1 Hour 6 (*)    All other components within normal limits  GEN5 CARDIAC TROPONIN T(TNT5) 3 HOUR - Abnormal; Notable for the following components:   TnT-Gen5 (3hr) 14 (*)    Delta 3 Hour 9 (*)    All other components within normal limits  TSH - Abnormal; Notable for the following components:   TSH 0.01 (*)    All other components within normal limits  D-DIMER, QUANTITATIVE - Abnormal; Notable for the following components:   D-Dimer 0.51 (*)    All other components within normal limits  FREE T4 - Abnormal; Notable for the following components:   Free T4 2.67 (*)    All other components within normal limits  GEN5 CARDIAC TROPONIN T (TNT5) BASELINE - Normal  PROTIME-INR - Normal  MAGNESIUM  - Normal  EXTRA TUBES   Narrative:    The following orders were created for panel order ED Tube Set / Rainbow. Procedure                               Abnormality         Status                    ---------                               -----------         ------                    Sharie Carbon Une[[id number]]                                  Final result              Viviane DDU[[id number]]  Final result               Please view results for these tests on the individual orders.  HCG, QUANTITATIVE  T3, FREE  LIGHT BLUE TOP  GOLD SST    ED COURSE AND TREATMENT:  Patient's condition remained stable during Emergency Department evaluation.   Previous medical records requested. 05/30/22 for hyperthyroidism  Orders written.  Diagnostics reviewed.   Medications  LR bolus 1,000 mL (0 mLs IntraVENous Stopped 07/10/22 1448)  iopamidol (ISOVUE-370) 76% injection 100 mL (75 mLs IntraVENous Given 07/10/22 1436)  propranolol  HCl (INDERAL ) tablet 60 mg (60 mg Oral Given 07/10/22 1645)  propylthiouracil (PTU) tablet 500 mg (500 mg Oral Given 07/10/22 1707)  Potassium Iodide (Expectorant) (SSKI) 1 GM/ML solution SOLN 230 mg (230 mg  Oral Given 07/10/22 1804)    BP 143/73   Pulse 115   Temp 97.5 F (36.4 C) (Oral)   Resp 21   Ht 1.6 m (5' 3)   Wt 94.3 kg (208 lb)   SpO2 100%   BMI 36.85 kg/m   ED Course as of 07/10/22 1808  [facility] Documentation  Wed Jul 10, 2022  1412 ECG 12 lead Sinus tachycardia, rate 118, PR 154, QRS 82, QTc 462, normal axis.  No STEMI  1715 There are no endocrinologist services at [facility] or at this facility. I spoke with intensivist, Dr. Daryll here who states the patient can likely be admitted to our facility on a medical floor. If status change or the patient deteriorates, medicine team can call Dr. Daryll to transfer to ICU floor. I spoke with Dr. Vernia who states NICS will evaluate the patient for admission   1805 After speaking with Dr. Isa, OBGYN and Dr. Vernia from medicine team, Dr. Isa will admit to OBGYN floor and have medicine consult during hospitalization.    CLINICAL IMPRESSION: 1. Palpitations   2. 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 Pregnancy, unspecified gestational age       CONDITION ON DISCHARGE:  Stable  PLAN AND FOLLOW-UP:   Admit for treatment of presumed thyrotoxicosis while currently pregnant.   PRESCRIPTIONS:    Medication List     ASK your doctor about these medications    famotidine  20 MG tablet Commonly known as: PEPCID    methimazole  10 MG tablet Commonly known as: TAPAZOLE  Take 2 tablets by mouth twice daily   metoprolol  succinate 25 mg 24 hr tablet Commonly known as: TOPROL -XL Take one tablet (25 mg dose) by mouth 2 (two) times daily.   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  The patient reports being [redacted] weeks pregnant at this time.  She is currently on metoprolol  and methimazole  as outpatient for history of hyperthyroidism.  Will obtain labs and imaging for further evaluation.  EKG shows no acute ST changes, high sensitive troponin are mildly trending upwards. D-dimer is elevated, therefore had shared decision-making with the patient to obtain CTA pulmonary.  CTA showed no evidence of PE.  TSH is low with  elevated free T4 concerning for thyrotoxicosis.  I discussed with Dr. Isa, OBGYN who states to treat medically, no emergent intervention needed from OBGYN standpoint. Given the patient's current pregnancy, will provide PTU and propranolol .  I discussed with intensivist, Dr. Daryll who states the patient can be admitted to medical floor at this facility.  Therefore, I discussed with Dr. Vernia who states NICS will consult on the patient and Dr. Isa, OBGYN will admit. The patient understands and agrees with the plans for admission.     Amount and/or Complexity of Data Reviewed Clinical lab tests: ordered and reviewed Tests in the radiology section of CPT: ordered and reviewed Review and summarize past medical records: yes Discuss the patient with other providers: yes Independent visualization of images, tracings, or specimens: yes  Patient Progress Patient progress: stable   The patient was seen, evaluated and managed by ED attending physician, Ludie Primmer, D.O.    Ludie Primmer, DO 07/10/22 1808

## 2022-07-11 DIAGNOSIS — R7989 Other specified abnormal findings of blood chemistry: Secondary | ICD-10-CM | POA: Insufficient documentation

## 2022-07-22 NOTE — L&D Delivery Note (Signed)
LABOR COURSE 27yo G2 now P2002 admitted for induction of labor for severe/difficult to control hyperthyroidism. Received cytotec, foley bulb, AROM, and pitocin. She progressed to complete.  Delivery Note Called to room and patient was complete and pushing. She pushed for 8 minutes to deliver a vigorous liveborn female infant at 62 on 02/13/23. Infant with spontaneous cry, placed on mother's abdomen, dried and stimulated. Cord clamped x 2 after 1-minute delay, and cut by patient's partner. Cord blood drawn. Placenta delivered spontaneously with gentle cord traction. Appears intact. Fundus firm with massage and Pitocin. Labia, perineum, vagina, and cervix inspected with small first degree and labial tears. First degree bleeding so repaired with two figure of eights of 3-0 vicryl.     APGAR: 8, 9; weight 6lb1.7oz  .   Cord: 3VC    Anesthesia:  Epidural Episiotomy: None Lacerations: First degree, right labial  Suture Repair: 3.0 vicryl Est. Blood Loss (mL): 215  Mom to postpartum.  Baby to Couplet care / Skin to Skin.  Lennart Pall, MD 02/14/23 6:51 PM

## 2022-07-23 NOTE — Progress Notes (Unsigned)
   GYNECOLOGY OFFICE VISIT NOTE  History:   April Benson is a 27 y.o. G1P1001 here today for follow up from [place]. Pt presented in thyroid storm. She has a history of Grave's disease and had stopped her medications. She was restarted on PTU and propranolol.    I reviewed her records from [place]:  I reviewed the physicians documentation during her admission, I reviewed her labwork that was performed and her CTPE and EKG reports.   She had an elevated troponin while there and had an EKG and CT PE study. For the CT PE this was negative for PE. EKGs were checked serially and ultimately found to be normal and likely thought to be related to her thyroid storm.   She had an [place] which showed overall an IUP but no CRL yet or FHT to confirm viability. Her US showed a gestational sac and subchorionic hemorrhage.   The discharge medications were: PTU 300 mg TID Propranolol 120 mg BID   Reviewed her prenatal bloodwork she had while there: A+/- RI, RPR NR. HIV and HCV not done. She had a P/C ratio on 193. Baseline CMP normal as well.    Past Medical History:  Diagnosis Date   Hypertension    Hyperthyroidism    Tachycardia     Past Surgical History:  Procedure Laterality Date   NO PAST SURGERIES      The following portions of the patient's history were reviewed and updated as appropriate: allergies, current medications, past family history, past medical history, past social history, past surgical history and problem list.   Health Maintenance:   No pap in Chart Review or CE. Found in Media tab - 12/10/19 pap normal.   Review of Systems:  Pertinent items noted in HPI and remainder of comprehensive ROS otherwise negative.  Physical Exam:  There were no vitals taken for this visit. CONSTITUTIONAL: Well-developed, well-nourished female in no acute distress.  HEENT:  Normocephalic, atraumatic. External right and left ear normal. No scleral icterus.  NECK: Normal range of motion,  supple, no masses noted on observation SKIN: No rash noted. Not diaphoretic. No erythema. No pallor. MUSCULOSKELETAL: Normal range of motion. No edema noted. NEUROLOGIC: Alert and oriented to person, place, and time. Normal muscle tone coordination. No cranial nerve deficit noted. PSYCHIATRIC: Normal mood and affect. Normal behavior. Normal judgment and thought content.  CARDIOVASCULAR: Normal heart rate noted RESPIRATORY: Effort and breath sounds normal, no problems with respiration noted ABDOMEN: No masses noted. No other overt distention noted.    PELVIC: Deferred  Labs and Imaging No results found for this or any previous visit (from the past 168 hour(s)). No results found.  Assessment and Plan:  Diagnoses and all orders for this visit:  Pregnancy with uncertain fetal viability, single or unspecified fetus  History of gestational hypertension  Hyperthyroidism affecting pregnancy in first trimester   Send for [place] for viability  Baseline labs and P/C ratio wnl.   Continue current regimen.  Has appt with endo on  ***   Routine preventative health maintenance measures emphasized. Please refer to After Visit Summary for other counseling recommendations.   No follow-ups on file.  Radene Gunning, MD, [place] for [facility], [place]

## 2022-07-24 ENCOUNTER — Ambulatory Visit: Payer: Medicaid Other | Admitting: Obstetrics and Gynecology

## 2022-07-24 DIAGNOSIS — Z8759 Personal history of other complications of pregnancy, childbirth and the puerperium: Secondary | ICD-10-CM

## 2022-07-24 DIAGNOSIS — O3680X0 Pregnancy with inconclusive fetal viability, not applicable or unspecified: Secondary | ICD-10-CM

## 2022-07-24 DIAGNOSIS — E0590 Thyrotoxicosis, unspecified without thyrotoxic crisis or storm: Secondary | ICD-10-CM

## 2022-07-24 NOTE — Progress Notes (Unsigned)
GYNECOLOGY OFFICE VISIT NOTE  History:   April Benson is a 27 y.o. G1P1001 here today for establishing early pregnancy status. She had an [place] at [place] which showed only a gestational sac.  She presented there with heart palpitations in the setting of know hyperthyroidism and found to be in thyroid storm. She was started on PTU and Propranolol. She had an elevated troponin which was followed along with serial EKG but ultimately felt to be related to her tachycardia.   Since starting the medication regimen she felt better but since that time her N/V of pregnancy has worsened such that she cannot keep her medicines down.     I reviewed her records from [place]:  I reviewed the physicians documentation during her admission, I reviewed her labwork that was performed and her CTPE and EKG reports.   She had an elevated troponin while there and had an EKG and CT PE study. For the CT PE this was negative for PE. EKGs were checked serially and ultimately found to be normal and likely thought to be related to her thyroid storm.   She had an [place] which showed overall an IUP but no CRL yet or FHT to confirm viability. Her US showed a gestational sac and subchorionic hemorrhage.   The discharge medications were: PTU 300 mg TID Propranolol 120 mg BID   Reviewed her prenatal bloodwork she had while there: A+/- RI, RPR NR. HIV, Hep B and HCV not done. She had a P/C ratio on 193. Baseline CMP normal as well.     Past Medical History:  Diagnosis Date   Hypertension    Hyperthyroidism    Tachycardia     Past Surgical History:  Procedure Laterality Date   NO PAST SURGERIES      The following portions of the patient's history were reviewed and updated as appropriate: allergies, current medications, past family history, past medical history, past social history, past surgical history and problem list.   Health Maintenance:   Normal pap 11/2019 Review of Systems:  Pertinent items noted in HPI and  remainder of comprehensive ROS otherwise negative.  Physical Exam:  BP 125/79   Pulse (!) 106   Wt 206 lb (93.4 kg)   LMP  (LMP Unknown)   BMI 36.49 kg/m  CONSTITUTIONAL: Well-developed, well-nourished female in no acute distress.  HEENT:  Normocephalic, atraumatic. External right and left ear normal. No scleral icterus.  NECK: Normal range of motion, supple, no masses noted on observation SKIN: No rash noted. Not diaphoretic. No erythema. No pallor. MUSCULOSKELETAL: Normal range of motion. No edema noted. NEUROLOGIC: Alert and oriented to person, place, and time. Normal muscle tone coordination. No cranial nerve deficit noted. PSYCHIATRIC: Normal mood and affect. Normal behavior. Normal judgment and thought content.    PELVIC: Deferred  Labs and Imaging No results found for this or any previous visit (from the past 168 hour(s)). No results found.  Assessment and Plan:   1. Less than [redacted] weeks gestation of pregnancy Need to confirm viability. Will check [place] two weeks from prior US (which was on 07/10/2022). Rx given for early f/u and then will establish for first OB visit.  Start anti-emetics: Zofran ODT, scop patch, phenergan suppositories if PO can't be kept down.  -     US OB LESS THAN 14 WEEKS WITH OB TRANSVAGINAL; Future -     ondansetron (ZOFRAN-ODT) 4 MG disintegrating tablet; Take 1 tablet (4 mg total) by mouth every 6 (six) hours  as needed for nausea. -     scopolamine (TRANSDERM-SCOP) 1 MG/3DAYS; Place 1 patch (1.5 mg total) onto the skin every 3 (three) days. -     promethazine (PROMETHEGAN) 50 MG suppository; Place 1 suppository (50 mg total) rectally every 6 (six) hours as needed for nausea or vomiting.  2. Elevated troponin Resolved, no further work up needed  3. History of gestational hypertension No intervention recommended at this time but would do ldASA once 12-14 weeks   4. Hyperthyroidism affecting pregnancy in first trimester Continue PTU and propranolol.  Continue follow up with endocrinology.     Routine preventative health maintenance measures emphasized. Please refer to After Visit Summary for other counseling recommendations.   No follow-ups on file.  Radene Gunning, MD, [place] for [facility], [place]

## 2022-07-25 ENCOUNTER — Encounter: Payer: Self-pay | Admitting: Obstetrics and Gynecology

## 2022-07-25 ENCOUNTER — Ambulatory Visit (INDEPENDENT_AMBULATORY_CARE_PROVIDER_SITE_OTHER): Admitting: Obstetrics and Gynecology

## 2022-07-25 VITALS — BP 125/79 | HR 106 | Wt 206.0 lb

## 2022-07-25 DIAGNOSIS — O99281 Endocrine, nutritional and metabolic diseases complicating pregnancy, first trimester: Secondary | ICD-10-CM | POA: Diagnosis not present

## 2022-07-25 DIAGNOSIS — R7989 Other specified abnormal findings of blood chemistry: Secondary | ICD-10-CM

## 2022-07-25 DIAGNOSIS — Z3A01 Less than 8 weeks gestation of pregnancy: Secondary | ICD-10-CM

## 2022-07-25 DIAGNOSIS — Z8759 Personal history of other complications of pregnancy, childbirth and the puerperium: Secondary | ICD-10-CM

## 2022-07-25 DIAGNOSIS — E0590 Thyrotoxicosis, unspecified without thyrotoxic crisis or storm: Secondary | ICD-10-CM

## 2022-07-25 MED ORDER — SCOPOLAMINE 1 MG/3DAYS TD PT72: 1.0000 | MEDICATED_PATCH | TRANSDERMAL | 12 refills | Status: DC

## 2022-07-25 MED ORDER — PROMETHAZINE HCL 50 MG RE SUPP: 50.0000 mg | Freq: Four times a day (QID) | RECTAL | 1 refills | Status: DC | PRN

## 2022-07-25 MED ORDER — ONDANSETRON 4 MG PO TBDP: 4.0000 mg | ORAL_TABLET | Freq: Four times a day (QID) | ORAL | 1 refills | Status: DC | PRN

## 2022-07-29 ENCOUNTER — Ambulatory Visit (INDEPENDENT_AMBULATORY_CARE_PROVIDER_SITE_OTHER): Payer: Medicaid Other

## 2022-07-29 DIAGNOSIS — Z3A01 Less than 8 weeks gestation of pregnancy: Secondary | ICD-10-CM | POA: Diagnosis not present

## 2022-07-29 DIAGNOSIS — O3680X0 Pregnancy with inconclusive fetal viability, not applicable or unspecified: Secondary | ICD-10-CM

## 2022-07-30 ENCOUNTER — Telehealth: Payer: Self-pay | Admitting: *Deleted

## 2022-07-30 NOTE — Telephone Encounter (Signed)
-----   Message from Lyndal Rainbow, [place] sent at 07/30/2022  8:37 AM EST -----  ----- Message ----- From: Radene Gunning, MD Sent: 07/30/2022   8:28 AM EST To: [facility] Clinical Pool  Please get her scheduled for new OB in 2-3 weeks.  Thanks, pad

## 2022-07-30 NOTE — Telephone Encounter (Signed)
Left patient a message to call and schedule. 

## 2022-08-04 ENCOUNTER — Inpatient Hospital Stay (HOSPITAL_COMMUNITY)
Admission: AD | Admit: 2022-08-04 | Discharge: 2022-08-04 | Disposition: A | Discharge status: Home / Self Care | Attending: Obstetrics and Gynecology | Admitting: Obstetrics and Gynecology

## 2022-08-04 ENCOUNTER — Encounter (HOSPITAL_COMMUNITY): Payer: Self-pay

## 2022-08-04 DIAGNOSIS — Z3A09 9 weeks gestation of pregnancy: Secondary | ICD-10-CM

## 2022-08-04 DIAGNOSIS — O26891 Other specified pregnancy related conditions, first trimester: Secondary | ICD-10-CM | POA: Diagnosis not present

## 2022-08-04 DIAGNOSIS — O219 Vomiting of pregnancy, unspecified: Secondary | ICD-10-CM | POA: Diagnosis present

## 2022-08-04 DIAGNOSIS — K5900 Constipation, unspecified: Secondary | ICD-10-CM

## 2022-08-04 DIAGNOSIS — O99611 Diseases of the digestive system complicating pregnancy, first trimester: Secondary | ICD-10-CM | POA: Insufficient documentation

## 2022-08-04 LAB — URINALYSIS, ROUTINE W REFLEX MICROSCOPIC
Bilirubin Urine: NEGATIVE
Glucose, UA: NEGATIVE mg/dL
Hgb urine dipstick: NEGATIVE
Ketones, ur: 20 mg/dL — AB
Nitrite: NEGATIVE
Protein, ur: 30 mg/dL — AB
Specific Gravity, Urine: 1.027 (ref 1.005–1.030)
pH: 5 (ref 5.0–8.0)

## 2022-08-04 MED ORDER — SCOPOLAMINE 1 MG/3DAYS TD PT72: 1.0000 | MEDICATED_PATCH | TRANSDERMAL | 12 refills | Status: DC

## 2022-08-04 MED ORDER — LACTATED RINGERS IV BOLUS
1000.0000 mL | Freq: Once | INTRAVENOUS | Status: AC
  Administered 2022-08-04: 1000 mL via INTRAVENOUS

## 2022-08-04 MED ORDER — SORBITOL 70 % SOLN
960.0000 mL | TOPICAL_OIL | Freq: Once | ORAL | Status: AC
  Administered 2022-08-04: 960 mL via RECTAL
  Filled 2022-08-04: qty 240

## 2022-08-04 MED ORDER — ONDANSETRON HCL 4 MG/2ML IJ SOLN
4.0000 mg | Freq: Once | INTRAMUSCULAR | Status: AC
  Administered 2022-08-04: 4 mg via INTRAVENOUS
  Filled 2022-08-04: qty 2

## 2022-08-04 MED ORDER — SCOPOLAMINE 1 MG/3DAYS TD PT72
1.0000 | MEDICATED_PATCH | Freq: Once | TRANSDERMAL | Status: DC
  Administered 2022-08-04: 1.5 mg via TRANSDERMAL
  Filled 2022-08-04: qty 1

## 2022-08-04 NOTE — Discharge Instructions (Signed)

## 2022-08-04 NOTE — MAU Note (Signed)
..  April Benson is a 27 y.o. at Unknown here in MAU reporting: Has been vomiting the entire pregnancy, has vomited 3 times today usually after she tries to take her thyroid medication. Reports she is not able to eat anything and feels weak. Took Zofran but it has not helped.  Has not had a bowel movement in a week, took stool softener and it has not helped.  Reports abdominal cramping that began a few hours ago. Denies vaginal bleeding.   Pain score: 3/10 Vitals:   08/04/22 1929  BP: 128/75  Pulse: 95  Resp: 17  Temp: 98.5 F (36.9 C)  SpO2: 100%      Lab orders placed from triage: UA

## 2022-08-04 NOTE — MAU Provider Note (Signed)
History     CSN: [id number]  Arrival date and time: 08/04/22 1902   Event Date/Time   First Provider Initiated Contact with Patient 08/04/22 2010      Chief Complaint  Patient presents with   Constipation   Emesis   April Benson , a  27 y.o. G2P1001 at [redacted]w[redacted]d presents to MAU with complaints of vomiting and constipation of the last week. Patient reports she had HG with a previous pregnancy and "feels like she has it with this one." Patient states she had a thyroid storm a few weeks ago and her medication was increased and she has had trouble taking her meds. Patient states she has been trying Zofran, without relief. Last dose was today at 1 pm. She endorses 3 episodes of vomiting today, and states she states she just feels "overall weak." Patient states she has not been able to keep down sips of water, and states she only urinates 1-2 times per day. She reports urine as "dark and a strong odor." She denies pain and discomfort with urination and denies abdominal pain. Patient states she was prescribed a scop patch and phenergan but was not approved bu insurance.     She also reports Constipation. Last BM was 1 week ago. Patient reports she has Been able to keep down the stool softener yesterday and day before not relief.      OB History     Gravida  2   Para  1   Term  1   Preterm      AB      Living  1      SAB      IAB      Ectopic      Multiple  0   Live Births  1           Past Medical History:  Diagnosis Date   Hypertension    Hyperthyroidism    Tachycardia     Past Surgical History:  Procedure Laterality Date   NO PAST SURGERIES      Family History  Problem Relation Age of Onset   Hypertension Mother    Diabetes Mother    Hypothyroidism Mother    Berenice Primas' disease Mother     Social History   Tobacco Use   Smoking status: Never   Smokeless tobacco: Never  Vaping Use   Vaping Use: Never used  Substance Use Topics   Alcohol use: Not  Currently   Drug use: Never    Allergies: No Known Allergies  Medications Prior to Admission  Medication Sig Dispense Refill Last Dose   ondansetron (ZOFRAN-ODT) 4 MG disintegrating tablet Take 1 tablet (4 mg total) by mouth every 6 (six) hours as needed for nausea. 30 tablet 1 08/04/2022   propranolol (INDERAL) 60 MG tablet Take by mouth.   08/04/2022   propylthiouracil (PTU) 50 MG tablet Take by mouth.   08/04/2022   promethazine (PHENERGAN) 12.5 MG tablet Take by mouth.      promethazine (PROMETHEGAN) 50 MG suppository Place 1 suppository (50 mg total) rectally every 6 (six) hours as needed for nausea or vomiting. 12 each 1    scopolamine (TRANSDERM-SCOP) 1 MG/3DAYS Place 1 patch (1.5 mg total) onto the skin every 3 (three) days. 10 patch 12     Review of Systems  Constitutional:  Negative for chills, fatigue and fever.  Eyes:  Negative for pain and visual disturbance.  Respiratory:  Negative for apnea, shortness of breath and  wheezing.   Cardiovascular:  Negative for chest pain and palpitations.  Gastrointestinal:  Positive for nausea and vomiting. Negative for abdominal pain, constipation and diarrhea.  Genitourinary:  Negative for difficulty urinating, dysuria, pelvic pain, vaginal bleeding, vaginal discharge and vaginal pain.  Musculoskeletal:  Negative for back pain.  Neurological:  Positive for dizziness, weakness and light-headedness. Negative for seizures and headaches.  Psychiatric/Behavioral:  Negative for suicidal ideas.    Physical Exam   Blood pressure 128/75, pulse 95, temperature 98.5 F (36.9 C), temperature source Oral, resp. rate 17, height 5\' 3"  (1.6 m), weight 91.8 kg, last menstrual period 05/30/2022, SpO2 100 %, unknown if currently breastfeeding.  Physical Exam Vitals and nursing note reviewed.  Constitutional:      General: She is not in acute distress.    Appearance: Normal appearance.  HENT:     Head: Normocephalic.  Pulmonary:     Effort: Pulmonary  effort is normal.  Musculoskeletal:     Cervical back: Normal range of motion.  Skin:    General: Skin is warm and dry.     Capillary Refill: Capillary refill takes less than 2 seconds.  Neurological:     Mental Status: She is alert and oriented to person, place, and time.  Psychiatric:        Mood and Affect: Mood normal.    MAU Course  Procedures Orders Placed This Encounter  Procedures   Urinalysis, Routine w reflex microscopic Urine, Clean Catch   Insert peripheral IV   Meds ordered this encounter  Medications   lactated ringers bolus 1,000 mL   scopolamine (TRANSDERM-SCOP) 1 MG/3DAYS 1.5 mg   ondansetron (ZOFRAN) injection 4 mg   sorbitol, milk of mag, mineral oil, glycerin (SMOG) enema    MDM - Patient reports symptoms resolved with SMOG enema, IV Fluids and meds for nausea and patient requesting to go home.  - Plan for discharge.   Assessment and Plan   1. Nausea and vomiting during pregnancy   2. Constipation, unspecified constipation type   3. [redacted] weeks gestation of pregnancy    - Reviewed normal discomforts of pregnancy and relief measures.  - Rx for scop patch and phenergan suppository resent to pharmacy. Encouraged patient to use a GoodRx coupon or notify the office if still not approved. - Reviewed Miralax and daily stool softeners for constipation  - Worsening signs and return precautions reviewed.  - Patient discharged home in stable condition and may return to MAU as needed.    Jacquiline Doe, MSN CNM  08/04/2022, 8:10 PM

## 2022-08-05 LAB — CULTURE, OB URINE: Culture: NO GROWTH

## 2022-08-07 ENCOUNTER — Encounter: Payer: Self-pay | Admitting: Obstetrics and Gynecology

## 2022-08-12 ENCOUNTER — Ambulatory Visit (INDEPENDENT_AMBULATORY_CARE_PROVIDER_SITE_OTHER): Admitting: Obstetrics and Gynecology

## 2022-08-12 ENCOUNTER — Ambulatory Visit (INDEPENDENT_AMBULATORY_CARE_PROVIDER_SITE_OTHER)

## 2022-08-12 ENCOUNTER — Encounter: Payer: Self-pay | Admitting: Obstetrics and Gynecology

## 2022-08-12 ENCOUNTER — Other Ambulatory Visit: Payer: Self-pay | Admitting: Obstetrics and Gynecology

## 2022-08-12 ENCOUNTER — Other Ambulatory Visit (HOSPITAL_COMMUNITY)
Admission: RE | Admit: 2022-08-12 | Discharge: 2022-08-12 | Disposition: A | Discharge status: Home / Self Care | Source: Ambulatory Visit | Attending: Obstetrics and Gynecology | Admitting: Obstetrics and Gynecology

## 2022-08-12 VITALS — BP 111/77 | HR 92 | Wt 197.0 lb

## 2022-08-12 DIAGNOSIS — Z3480 Encounter for supervision of other normal pregnancy, unspecified trimester: Secondary | ICD-10-CM

## 2022-08-12 DIAGNOSIS — O99281 Endocrine, nutritional and metabolic diseases complicating pregnancy, first trimester: Secondary | ICD-10-CM | POA: Diagnosis not present

## 2022-08-12 DIAGNOSIS — Z3482 Encounter for supervision of other normal pregnancy, second trimester: Secondary | ICD-10-CM

## 2022-08-12 DIAGNOSIS — O0990 Supervision of high risk pregnancy, unspecified, unspecified trimester: Secondary | ICD-10-CM | POA: Insufficient documentation

## 2022-08-12 DIAGNOSIS — Z3A10 10 weeks gestation of pregnancy: Secondary | ICD-10-CM | POA: Diagnosis not present

## 2022-08-12 DIAGNOSIS — O219 Vomiting of pregnancy, unspecified: Secondary | ICD-10-CM

## 2022-08-12 DIAGNOSIS — Z3481 Encounter for supervision of other normal pregnancy, first trimester: Secondary | ICD-10-CM | POA: Diagnosis not present

## 2022-08-12 DIAGNOSIS — E0590 Thyrotoxicosis, unspecified without thyrotoxic crisis or storm: Secondary | ICD-10-CM

## 2022-08-12 DIAGNOSIS — Z23 Encounter for immunization: Secondary | ICD-10-CM

## 2022-08-12 DIAGNOSIS — Z8759 Personal history of other complications of pregnancy, childbirth and the puerperium: Secondary | ICD-10-CM

## 2022-08-12 MED ORDER — POLYETHYLENE GLYCOL 3350 17 G PO PACK: 17.0000 g | PACK | Freq: Every day | ORAL | 3 refills | Status: DC

## 2022-08-12 MED ORDER — METOCLOPRAMIDE HCL 10 MG PO TABS: 10.0000 mg | ORAL_TABLET | Freq: Four times a day (QID) | ORAL | 3 refills | Status: DC

## 2022-08-12 MED ORDER — FOLIC ACID 400 MCG PO TABS: 400.0000 ug | ORAL_TABLET | Freq: Every day | ORAL | 3 refills | Status: DC

## 2022-08-12 MED ORDER — ASPIRIN 81 MG PO TBEC: 81.0000 mg | DELAYED_RELEASE_TABLET | Freq: Every day | ORAL | 2 refills | Status: DC

## 2022-08-12 NOTE — Patient Instructions (Signed)
Helpful things for constipation  - Kiwi fruit with skin on (only 1 per day!), yellow dragonfruit, papaya - Increase fiber in diet and drink lots of water - Over the counter laxatives - Miralax or senna daily. Senna can cause more cramping, so Miralax might be most helpful. Drink with lots of water or you can get dehydrated

## 2022-08-12 NOTE — Progress Notes (Signed)
Subjective:   April Benson is a 27 y.o. G2P1001 at [redacted]w[redacted]d by LMP = 8w [place] being seen today for her first obstetrical visit.  Her obstetrical history is significant for  hyperthyroidism, history of gestational hypertension, and nausea/vomiting of pregnancy . Patient does not intend to breast feed. Pregnancy history fully reviewed.  Patient reports persistent nausea and vomiting, though improving slightly. Taking scheduled zofran 4mg  q6h and PO phenergan qHS. Scop patch and PRN phenergan were going to cost $600 despite GoodRx. Has been tolerating liquids and small amounts of simple carbs. Lost 6lb this pregnancy.   HISTORY: OB History  Gravida Para Term Preterm AB Living  2 1 1  0 0 1  SAB IAB Ectopic Multiple Live Births  0 0 0 0 1    # Outcome Date GA Lbr Len/2nd Weight Sex Delivery Anes PTL Lv  2 Current           1 Term 01/19/22 [redacted]w[redacted]d 06:29 / 01:17 6 lb 13.4 oz (3.1 kg) F Vag-Spont EPI  LIV     Birth Comments: WDL      Name: Livecchi,PAISLEY MARIE     Apgar1: 8  Apgar5: 9    Last pap smear: No results found for: "DIAGPAP", "HPV", "[place]"  Past Medical History:  Diagnosis Date   Hypertension    Hyperthyroidism    Tachycardia    Past Surgical History:  Procedure Laterality Date   NO PAST SURGERIES     Family History  Problem Relation Age of Onset   Hypertension Mother    Diabetes Mother    Hypothyroidism Mother    Berenice Primas' disease Mother    Social History   Tobacco Use   Smoking status: Never   Smokeless tobacco: Never  Vaping Use   Vaping Use: Never used  Substance Use Topics   Alcohol use: Not Currently   Drug use: Never   No Known Allergies Current Outpatient Medications on File Prior to Visit  Medication Sig Dispense Refill   ondansetron (ZOFRAN-ODT) 4 MG disintegrating tablet Take 1 tablet (4 mg total) by mouth every 6 (six) hours as needed for nausea. 30 tablet 1   propranolol (INDERAL) 60 MG tablet Take by mouth.     propylthiouracil (PTU) 50 MG  tablet Take by mouth.     promethazine (PROMETHEGAN) 50 MG suppository Place 1 suppository (50 mg total) rectally every 6 (six) hours as needed for nausea or vomiting. (Patient not taking: Reported on 08/12/2022) 12 each 1   No current facility-administered medications on file prior to visit.   Exam   Vitals:   08/12/22 1025  BP: 111/77  Pulse: 92  Weight: 197 lb (89.4 kg)     General:  Alert, oriented and cooperative. Patient is in no acute distress.  Breast: Deferred  Cardiovascular: Normal heart rate noted  Respiratory: Normal respiratory effort, no problems with respiration noted  Abdomen: Soft, gravid, appropriate for gestational age.        Pelvic: NEFG. Normal cervix & vagina. Pap collected. Exam performed in presence of chaperone  Extremities: Normal range of motion.  Edema: None  Mental Status: Normal mood and affect. Normal behavior. Normal judgment and thought content.    Assessment:   Pregnancy: G2P1001 Patient Active Problem List   Diagnosis Date Noted   Supervision of other normal pregnancy, antepartum 08/12/2022   History of gestational hypertension 01/19/2022   Hyperthyroidism complicating pregnancy 60/04/9322   Nausea and vomiting of pregnancy, antepartum 08/09/2021  Plan:  1. Encounter for supervision of other normal pregnancy in second trimester Initial labs drawn. Rx sent for PO folate iso persistent n/v Early A1c ordered for Fhx diabetes & BMI 34 Genetic Screening discussed: NIPS ordered. Reports previously normal carrier screening. Ultrasound discussed; fetal anatomic survey: ordered. Problem list reviewed and updated. The nature of [facility] with multiple MDs and other Advanced Practice Providers was explained to patient; also emphasized that residents, students are part of our team. Routine obstetric precautions reviewed. - Pregnancy, Initial Screen - Culture, OB Urine - Hepatitis C Antibody - [place] MFM OB  DETAIL +14 WK; Future - US OB Limited; Future - HgB A1c - Cytology - PAP( [place]) - Cervicovaginal ancillary only( [place]) - Panorama Prenatal Test Full Panel - [place] MFM OB DETAIL +14 WK; Future  2. Hyperthyroidism affecting pregnancy in first trimester F/w endo - next appt 1/23 Last TFTs 1/10: TSH 07/31/22 <0.005, fT4 9.2  (in care everywhere) On propranolol & PTU  3. History of gestational hypertension Baseline labs WNL (see CareEverywhere) Sent MyChart message about ldASA at 12 weeks if able to tolerate  4. Nausea/vomiting of pregnancy Currently tolerating PO Discussed adding scheduled reglan Can add IVF prn  5. Constipation Reviewed dietary changes, medication options  Return in about 4 weeks (around 09/09/2022) for return OB at 14-16 weeks.  Gale Journey, MD [place], [facility] for [employer], [place]

## 2022-08-13 LAB — CERVICOVAGINAL ANCILLARY ONLY
Chlamydia: NEGATIVE
Comment: NEGATIVE
Comment: NORMAL
Neisseria Gonorrhea: NEGATIVE

## 2022-08-14 ENCOUNTER — Encounter: Payer: Self-pay | Admitting: Obstetrics and Gynecology

## 2022-08-14 DIAGNOSIS — E0591 Thyrotoxicosis, unspecified with thyrotoxic crisis or storm: Secondary | ICD-10-CM | POA: Insufficient documentation

## 2022-08-14 DIAGNOSIS — Z2839 Other underimmunization status: Secondary | ICD-10-CM | POA: Insufficient documentation

## 2022-08-14 HISTORY — DX: Thyrotoxicosis, unspecified with thyrotoxic crisis or storm: E05.91

## 2022-08-14 LAB — HCV INTERPRETATION

## 2022-08-14 LAB — PREGNANCY, INITIAL SCREEN
Antibody Screen: NEGATIVE
Basophils Absolute: 0 10*3/uL (ref 0.0–0.2)
Basos: 0 %
Chlamydia trachomatis, NAA: NEGATIVE
EOS (ABSOLUTE): 0 10*3/uL (ref 0.0–0.4)
Eos: 0 %
Glucose, UA: NEGATIVE
HCV Ab: NONREACTIVE
HIV Screen 4th Generation wRfx: NONREACTIVE
Hematocrit: 37.6 % (ref 34.0–46.6)
Hemoglobin: 12.3 g/dL (ref 11.1–15.9)
Hepatitis B Surface Ag: NEGATIVE
Immature Grans (Abs): 0.1 10*3/uL (ref 0.0–0.1)
Immature Granulocytes: 1 %
Lymphocytes Absolute: 1.2 10*3/uL (ref 0.7–3.1)
Lymphs: 25 %
MCH: 26.5 pg — ABNORMAL LOW (ref 26.6–33.0)
MCHC: 32.7 g/dL (ref 31.5–35.7)
MCV: 81 fL (ref 79–97)
Monocytes Absolute: 0.4 10*3/uL (ref 0.1–0.9)
Monocytes: 8 %
Neisseria Gonorrhoeae by PCR: NEGATIVE
Neutrophils Absolute: 3.3 10*3/uL (ref 1.4–7.0)
Neutrophils: 66 %
Nitrite, UA: NEGATIVE
Platelets: 211 10*3/uL (ref 150–450)
RBC, UA: NEGATIVE
RBC: 4.64 x10E6/uL (ref 3.77–5.28)
RDW: 15 % (ref 11.7–15.4)
RPR Ser Ql: REACTIVE — AB
Rh Factor: POSITIVE
Rubella Antibodies, IGG: <0.9 index — ABNORMAL LOW (ref >0.99)
Specific Gravity, UA: 1.026 (ref 1.005–1.030)
Urobilinogen, Ur: 2 mg/dL — ABNORMAL HIGH (ref 0.2–1.0)
WBC: 5 10*3/uL (ref 3.4–10.8)
pH, UA: 6.5 (ref 5.0–7.5)

## 2022-08-14 LAB — URINE CULTURE, OB REFLEX: Organism ID, Bacteria: NO GROWTH

## 2022-08-14 LAB — HEMOGLOBIN A1C
Est. average glucose Bld gHb Est-mCnc: 105 mg/dL
Hgb A1c MFr Bld: 5.3 % (ref 4.8–5.6)

## 2022-08-14 LAB — CULTURE, OB URINE

## 2022-08-14 LAB — MICROSCOPIC EXAMINATION
Bacteria, UA: NONE SEEN
Casts: NONE SEEN /lpf
WBC, UA: NONE SEEN /hpf (ref 0–5)

## 2022-08-14 LAB — HEPATITIS C ANTIBODY: Hep C Virus Ab: NONREACTIVE

## 2022-08-14 LAB — RPR, QUANT+TP ABS (REFLEX)
Rapid Plasma Reagin, Quant: 1:1 {titer} — ABNORMAL HIGH
T Pallidum Abs: NONREACTIVE

## 2022-08-15 NOTE — Discharge Summary (Signed)
 [facility]  Obstetrics Discharge Note  Discharge Details   Admit date:         08/14/2022 Discharge date:        08/15/2022  Hospital Days:    1 days  Active Hospital Problems   Diagnosis Date Noted POA  . 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 Pregnancy, unspecified gestational age 29/21/2023 Not Applicable    Resolved Hospital Problems   Diagnosis Date Noted Date Resolved POA  . Chest pain, unspecified type 08/14/2022 08/15/2022 Yes    Current Discharge Medication List     DISCONTINUED medications     scopolamine  (TRANSDERM-SCOP 1.5 MG) 1 MG/3DAYS patch        NEW medications   Details  acetaminophen  (TYLENOL ) 325 mg tablet Take two tablets (650 mg dose) by mouth every 6 (six) hours as needed for Fever (Pain level 1-10) for up to 10 days. Start date: 08/14/2022, End date: 08/24/2022    docusate sodium  100 MG CAPS Take one capsule (100 mg dose) by mouth 2 (two) times a day as needed for Constipation. Start date: 08/14/2022    !! propylthiouracil (PTU) 50 MG tablet Take eight tablets (400 mg dose) by mouth every 8 (eight) hours for 20 days. Start date: 08/15/2022, End date: 09/04/2022     !! - Potential duplicate medications found. Please discuss with provider.     CONTINUED medications   Details  famotidine  (PEPCID ) 20 MG tablet Take one tablet (20 mg dose) by mouth 2 (two) times daily.    ondansetron  (ZOFRAN -ODT) 4 mg disintegrating tablet Take by mouth.    Prenatal MV-Min-Fe Fum-FA-DHA (PRENATAL 1 PO) Take by mouth.    promethazine  (PHENERGAN ) 12.5 MG tablet Take one tablet (12.5 mg dose) by mouth every 12 (twelve) hours as needed for Nausea for up to 30 days.    propranolol  HCl (INDERAL ) 60 MG tablet Take two tablets (120 mg dose) by mouth 2 (two) times daily.    !! propylthiouracil (PTU) 50 MG tablet TAKE 6 TABLETS BY MOUTH THREE TIMES DAILY     !! - Potential duplicate medications found. Please discuss with provider.       Assessment/Plan   Active Hospital Problems   Diagnosis Date Noted POA  . 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 Pregnancy, unspecified gestational age 29/21/2023 Not Applicable    Resolved Hospital Problems   Diagnosis Date Noted Date Resolved POA  . Chest pain, unspecified type 08/14/2022 08/15/2022 Yes   Physicians involved in care during this hospitalization Attending Provider: Katheryn Rollo Jewels, DO Attending Provider: Darian M Laneave, MD Admitting Provider: Darian M Laneave, MD Consulting Physician: [facility] Consulting Physician: Rush JONETTA Essex, MD Bedside Procedures   No orders found      Condition at Discharge: stable, improved     Contact information for follow-up     [facility]      Next Steps: Follow up   Instructions: Follow-up with your primary OBGYN for any further prenatal care and management.  Follow-up with primary OBGYN's hospital for any further admission needs.   Ami FORBES Bales, MD  Specialty: Internal Medicine, Endocrinology   [address] [place] [address]  Phone: [phone]     Next Steps: Schedule an appointment as soon as possible for a visit   Instructions: Follow-up Visit for further management and evaluation of hyperthyroid disorder.           Interval History  History of Present Illness Patient presented 10 weeks after episodes of palpitations, chest pain/tightness, tremors and diaphoresis expereinced at home. Upon admission to ED patient continued experiencing intermittnet palpitiatons and chest tightness. Workup concerning for thyroid  storm consistent with her prior history and hyperthyroidism history. No obstetric concerns. Patient was managed overnight with medicine consultation. PTU increased to 400mg  TID, continued propranolol  as home dosing given quick resolution of tachycardia and palpitations. She was monitored overnight and remained  hemodynamically stable and symptomatically improved. Will plan to repeat lab workup HD2 however with clinical improvement will be stable for discharge to follow-up with her primary OBGYN office and endocrinologist.   Medications:  . docusate sodium   100 mg Oral BID  . potassium iodide  10 drop Oral TID  . prenatal multivitamin  1 tablet Oral Daily  . propranolol  HCl  60 mg Oral BID  . propylthiouracil  400 mg Oral 3 times per day    Physical Examination  Temp:  [97.7 F (36.5 C)-98.6 F (37 C)] 98.2 F (36.8 C) Heart Rate:  [74-97] 74 Resp:  [16-20] 18 BP: (104-130)/(52-79) 111/59 SpO2:  [97 %-100 %] 98 % Pain Score: 0-No pain        No intake or output data in the 24 hours ending 08/14/22 0700  Physical Exam Vitals and nursing note reviewed. Exam conducted with a chaperone present.  Constitutional:      General: She is not in acute distress.    Appearance: Normal appearance.  HENT:     Head: Normocephalic.  Eyes:     Conjunctiva/sclera: Conjunctivae normal.  Cardiovascular:     Rate and Rhythm: Normal rate and regular rhythm.     Pulses: Normal pulses.  Musculoskeletal:     Right lower leg: No edema.     Left lower leg: No edema.  Pulmonary:     Effort: Pulmonary effort is normal.     Breath sounds: Normal breath sounds.  Abdominal:     General: Bowel sounds are normal. There is distension (gravid abdomen).     Palpations: Abdomen is soft.     Tenderness: There is no abdominal tenderness. There is no guarding or rebound.  Genitourinary:    General: Normal vulva.     Vagina: No vaginal discharge.  Skin:    General: Skin is warm.  Neurological:     General: No focal deficit present.     Mental Status: She is alert and oriented to person, place, and time.     Cranial Nerves: No cranial nerve deficit.     Deep Tendon Reflexes: Reflexes normal.  Psychiatric:        Mood and Affect: Mood normal.        Behavior: Behavior normal.        Thought Content: Thought  content normal.        Judgment: Judgment normal.    Results  Labs:  Recent Results (from the past 24 hour(s))  CBC And Differential   Collection Time: 08/14/22  7:23 AM  Result Value Ref Range   WBC 5.4 3.7 - 11.0 thou/mcL   RBC 4.01 4.01 - 4.90 million/mcL   HGB 10.6 (L) 12.2 - 14.9 gm/dL   HCT 67.1 (L) 64.1 - 52.0 %   MCV 82 82 - 98 fL   MCH 26.4 (L) 27.0 - 33.0 pg   MCHC 32.3 31.0 - 37.0 gm/dL   Plt Ct 832 849 - 599 thou/mcL   RDW SD 44.2 36.0 - 47.0 fL  MPV 11.9 (H) 8.9 - 11.2 fL   NRBC% 0.0 0 /100WBC   NRBC 0.000 0 thou/mcL   NEUTROPHIL % 70.0 50.0 - 70.0 %   LYMPHOCYTE % 22.2 (L) 25.0 - 40.0 %   MONOCYTE % 7.4 4.0-12.0 % %   Eosinophil % 0.0 (L) 1.0 - 6.0 %   BASOPHIL % 0.2 0.0 - 2.0 %   IG% 0.200 0.001 - 0.429 %   ABSOLUTE NEUTROPHIL COUNT 3.76 1.50 - 7.50 thou/mcL   ABSOLUTE LYMPHOCYTE COUNT 1.2 1.0 - 4.5 thou/mcL   MONO ABSOLUTE 0.4 0.1 - 0.8 thou/mcL   EOS ABSOLUTE 0.0 0.0 - 0.5 thou/mcL   BASO ABSOLUTE 0.0 0.0 - 0.2 thou/mcL   IG ABSOLUTE 0.010 0.001 - 0.031 thou/mcL   Comprehensive Metabolic Panel   Collection Time: 08/14/22  7:23 AM  Result Value Ref Range   Na 136 136 - 146 mmol/L   Potassium 3.3 (L) 3.7 - 5.4 mmol/L   Cl 107 97 - 108 mmol/L   CO2 19 (L) 20 - 32 mmol/L   AGAP 10 7 - 16 mmol/L   Glucose 115 (H) 65 - 99 mg/dL   BUN 5 (L) 6 - 20 mg/dL   Creatinine 9.71 (L) 9.42 - 1.00 mg/dL   Ca 8.5 (L) 8.7 - 89.7 mg/dL   ALK PHOS 77 25 - 849 U/L   T Bili 0.49 0.00 - 1.20 mg/dL   Total Protein 5.9 (L) 6.0 - 8.5 gm/dL   Alb 3.3 (L) 3.5 - 5.5 gm/dL   GLOBULIN 2.6 1.5 - 4.5 gm/dL   ALBUMIN/GLOBULIN RATIO 1.3 1.1 - 2.5   BUN/CREAT RATIO 17.9 11.0 - 26.0   ALT 38 0 - 40 U/L   AST 27 0 - 40 U/L   eGFR 150 mL/min/1.88m2  Gen5 Cardiac Troponin T (TnT5) Baseline Series at: baseline, 1 hour, and 3 hour; Onset of symptoms: Less than 3 hours or undetermined   Collection Time: 08/14/22  7:23 AM  Result Value Ref Range   TnT-Gen5 (0hr) 5 <14 ng/L  hCG,  Serum, QuaNt   Collection Time: 08/14/22  7:23 AM  Result Value Ref Range   B HCG Quant 50,176 mIU/mL  Free T4   Collection Time: 08/14/22  7:23 AM  Result Value Ref Range   Free T4 3.44 (H) 0.82 - 1.77 ng/dL  Magnesium    Collection Time: 08/14/22  7:23 AM  Result Value Ref Range   Mg 1.7 1.6 - 2.6 mg/dL  TSH   Collection Time: 08/14/22  8:07 AM  Result Value Ref Range   TSH <0.01 (L) 0.45 - 4.50 mcIU/mL  D-Dimer, Quantitative   Collection Time: 08/14/22  8:07 AM  Result Value Ref Range   D-Dimer <0.27 <0.50 ug/mL(FEU)  Gen5 Cardiac Troponin T (TnT5) 1H   Collection Time: 08/14/22  8:09 AM  Result Value Ref Range   TnT-Gen5 (1hr) 5 <14 ng/L   Delta 1 Hour 0 <5 ng/L  Urinalysis W/Micro Reflex Culture - Symptomatic   Collection Time: 08/14/22  9:39 AM  Result Value Ref Range   Urine Color Yellow Yellow    Urine Appearance Clear Clear   Urine Specific Gravity 1.010 1.005 - 1.030   Urine pH 7.5 5 to 9   Urine Protein - Dipstick Negative Negative mg/dl   Urine Glucose Negative Negative mg/dL   Urine Ketones 40 (A) Negative mg/dl   Urine Bilirubin Negative Negative mg/dL   Urine Blood Negative Negative mg/dL   Urine Nitrite Negative Negative  Urine Urobilinogen 4 (A) <2 mg/dl   Urine Leukocyte Esterase 75 (A) Negative Leu/mcL   Urine Squamous Epithelial Cells 5-10 (A) 0 - 2 /HPF   Urine WBC 5-10 (A) 0 - 2 /hpf   Urine RBC 0-2 0 - 2 /HPF   Urine Bacteria 1+ (A) None /HPF   UA Amorphous Few Rare, Few, Occasional, None /HPF   Urine Mucous 2+ (A) None /HPF   UA Microscopic Yes Micro (A) No Micro  Gen5 Cardiac Troponin T (TnT5) 3H   Collection Time: 08/14/22 10:18 AM  Result Value Ref Range   TnT-Gen5 (3hr) 5 <14 ng/L   Delta 3 Hour 0 <7 ng/L  CBC   Collection Time: 08/15/22  6:43 AM  Result Value Ref Range   WBC 3.5 (L) 3.7 - 11.0 thou/mcL   RBC 4.03 4.01 - 4.90 million/mcL   HGB 11.0 (L) 12.2 - 14.9 gm/dL   HCT 67.0 (L) 64.1 - 52.0 %   MCV 82 82 - 98 fL   MCH 27.3  27.0 - 33.0 pg   MCHC 33.4 31.0 - 37.0 gm/dL   Plt Ct 836 849 - 599 thou/mcL   RDW SD 45.0 36.0 - 47.0 fL   MPV 11.6 (H) 8.9 - 11.2 fL   NRBC% 0.0 0 /100WBC   NRBC 0.000 0 thou/mcL   Unresulted Labs     Order Current Status   Comprehensive Metabolic Panel In process   Culture, Urine Urine Urine, Clean Catch In process   T3, Free SO In process   TSH reflex T4Free/ T3 Hormone SO In process       Electronically signed: Darian M LaNeave, MD 08/15/2022 / 6:51 AM

## 2022-08-19 ENCOUNTER — Encounter: Payer: Self-pay | Admitting: Obstetrics and Gynecology

## 2022-08-19 LAB — CYTOLOGY - PAP
Comment: NEGATIVE
Diagnosis: NEGATIVE
High risk HPV: NEGATIVE

## 2022-08-27 LAB — PANORAMA PRENATAL TEST FULL PANEL:PANORAMA TEST PLUS 5 ADDITIONAL MICRODELETIONS: FETAL FRACTION: 5.3

## 2022-09-09 ENCOUNTER — Encounter: Payer: Self-pay | Admitting: Obstetrics & Gynecology

## 2022-09-09 ENCOUNTER — Ambulatory Visit (INDEPENDENT_AMBULATORY_CARE_PROVIDER_SITE_OTHER)

## 2022-09-09 ENCOUNTER — Ambulatory Visit (INDEPENDENT_AMBULATORY_CARE_PROVIDER_SITE_OTHER): Admitting: Obstetrics & Gynecology

## 2022-09-09 VITALS — BP 118/71 | HR 97 | Temp 97.9°F | Resp 16 | Ht 63.0 in | Wt 193.4 lb

## 2022-09-09 VITALS — BP 126/65 | HR 101 | Wt 193.0 lb

## 2022-09-09 DIAGNOSIS — O99282 Endocrine, nutritional and metabolic diseases complicating pregnancy, second trimester: Secondary | ICD-10-CM

## 2022-09-09 DIAGNOSIS — Z8759 Personal history of other complications of pregnancy, childbirth and the puerperium: Secondary | ICD-10-CM

## 2022-09-09 DIAGNOSIS — E0590 Thyrotoxicosis, unspecified without thyrotoxic crisis or storm: Secondary | ICD-10-CM

## 2022-09-09 DIAGNOSIS — O219 Vomiting of pregnancy, unspecified: Secondary | ICD-10-CM

## 2022-09-09 DIAGNOSIS — Z3480 Encounter for supervision of other normal pregnancy, unspecified trimester: Secondary | ICD-10-CM

## 2022-09-09 DIAGNOSIS — Z3A14 14 weeks gestation of pregnancy: Secondary | ICD-10-CM

## 2022-09-09 MED ORDER — ONDANSETRON 8 MG PO TBDP: 8.0000 mg | ORAL_TABLET | Freq: Three times a day (TID) | ORAL | 3 refills | Status: DC | PRN

## 2022-09-09 MED ORDER — SODIUM CHLORIDE 0.9 % IV SOLN: INTRAVENOUS | Status: DC

## 2022-09-09 MED ORDER — SODIUM CHLORIDE 0.9 % IV SOLN
Freq: Once | INTRAVENOUS | Status: AC
  Filled 2022-09-09: qty 1000

## 2022-09-09 MED ORDER — PROMETHAZINE HCL 25 MG/ML IJ SOLN: 12.5000 mg | Freq: Once | INTRAMUSCULAR | Status: AC

## 2022-09-09 MED ORDER — SODIUM CHLORIDE 0.9 % IV SOLN: 12.5000 mg | Freq: Once | INTRAVENOUS | Status: DC

## 2022-09-09 NOTE — Progress Notes (Signed)
   PRENATAL VISIT NOTE  Subjective:  April Benson is a 27 y.o. G2P1001 at 60w4dbeing seen today for ongoing prenatal care.  She is currently monitored for the following issues for this high-risk pregnancy and has Nausea and vomiting of pregnancy, antepartum; Hyperthyroidism complicating pregnancy; History of gestational hypertension; Supervision of other normal pregnancy, antepartum; and Rubella non-immune status, antepartum on their problem list.  Patient reports nausea and vomiting.  Contractions: Not present. Vag. Bleeding: None.  Movement: Absent. Denies leaking of fluid.   The following portions of the patient's history were reviewed and updated as appropriate: allergies, current medications, past family history, past medical history, past social history, past surgical history and problem list.   Objective:   Vitals:   09/09/22 1047  BP: 126/65  Pulse: (!) 101  Weight: 193 lb (87.5 kg)    Fetal Status: Fetal Heart Rate (bpm): 145   Movement: Absent     General:  Alert, oriented and cooperative. Patient is in no acute distress.  Skin: Skin is warm and dry. No rash noted.   Cardiovascular: Normal heart rate noted  Respiratory: Normal respiratory effort, no problems with respiration noted  Abdomen: Soft, gravid, appropriate for gestational age.  Pain/Pressure: Absent     Pelvic: Cervical exam deferred        Extremities: Normal range of motion.  Edema: None  Mental Status: Normal mood and affect. Normal behavior. Normal judgment and thought content.   Assessment and Plan:  Pregnancy: G2P1001 at 166w4d. Supervision of other normal pregnancy, antepartum Labs reviewed and Anatomy USKoreacheduled.  Needs AFP  2. Nausea and vomiting of pregnancy, antepartum Maxed out on meds and through up PTU and zofran this morning.  Coordinated appt with infusion center for VS + 12.5 phenergan IV  Starts today 1:30 and will be M/TH going forward  3. Hyperthyroidism affecting pregnancy in first  trimester Followed by endocrine--Pt is to ask MD if she can use PTU PR and what would dose be if possible   4. History of gestational hypertension Start baby asa as tolerated.   Preterm labor symptoms and general obstetric precautions including but not limited to vaginal bleeding, contractions, leaking of fluid and fetal movement were reviewed in detail with the patient. Please refer to After Visit Summary for other counseling recommendations.   No follow-ups on file.  Future Appointments  Date Time Provider [place]2/19/2024  1:30 PM CHINF-CHAIR 5 CH-INFWM None  10/07/2022 10:50 AM LeGuss BundeMD [facility]25/2024 10:15 AM [facility]URSE [facility]/25/2024 10:30 AM [facility] US3 [facility]-MFCUS [facility]    Ke[place]as SacramentoMD

## 2022-09-09 NOTE — Progress Notes (Signed)
Diagnosis:   Nausea and vomiting of pregnancy, antepartum    Provider:  Marshell Garfinkel MD  Procedure: Infusion  IV Type: Peripheral, IV Location: L Antecubital  Phenergan with NS 1000 ml,Dose: 12.5  Infusion Start Time: [id number]  Infusion Stop Time: 1530  Post Infusion IV Care: Peripheral IV Discontinued  Discharge: Condition: Good, Destination: Home . AVS Provided and AVS Declined  Performed by:  Cleophus Molt, RN

## 2022-09-10 ENCOUNTER — Other Ambulatory Visit: Payer: Self-pay

## 2022-09-10 ENCOUNTER — Ambulatory Visit

## 2022-09-12 ENCOUNTER — Ambulatory Visit

## 2022-09-13 ENCOUNTER — Ambulatory Visit (INDEPENDENT_AMBULATORY_CARE_PROVIDER_SITE_OTHER)

## 2022-09-13 VITALS — BP 113/72 | HR 88 | Temp 98.4°F | Resp 18 | Ht 63.0 in | Wt 192.6 lb

## 2022-09-13 DIAGNOSIS — Z3A15 15 weeks gestation of pregnancy: Secondary | ICD-10-CM

## 2022-09-13 DIAGNOSIS — O219 Vomiting of pregnancy, unspecified: Secondary | ICD-10-CM | POA: Diagnosis not present

## 2022-09-13 MED ORDER — SODIUM CHLORIDE 0.9 % IV SOLN
Freq: Once | INTRAVENOUS | Status: AC
  Filled 2022-09-13: qty 1000

## 2022-09-13 NOTE — Progress Notes (Signed)
Diagnosis: nausea and vomiting during pregnancy    Provider:  Marshell Garfinkel MD  Procedure: Infusion  IV Type: Peripheral, IV Location: R Hand  Lactated Ringers with Phenergan  , Dose: 1000 ml and 12.5  Infusion Start Time: 1410  Infusion Stop Time: 1525  Post Infusion IV Care: Peripheral IV Discontinued  Discharge: Condition: Good, Destination: Home . AVS Provided  Performed by:  Fraser Din Pilkington-Burchett, RN

## 2022-09-16 ENCOUNTER — Ambulatory Visit (INDEPENDENT_AMBULATORY_CARE_PROVIDER_SITE_OTHER)

## 2022-09-16 VITALS — BP 110/71 | HR 89 | Temp 98.4°F | Resp 20 | Ht 63.0 in | Wt 193.7 lb

## 2022-09-16 DIAGNOSIS — Z3A15 15 weeks gestation of pregnancy: Secondary | ICD-10-CM | POA: Diagnosis not present

## 2022-09-16 DIAGNOSIS — O219 Vomiting of pregnancy, unspecified: Secondary | ICD-10-CM

## 2022-09-16 MED ORDER — SODIUM CHLORIDE 0.9 % IV SOLN
Freq: Once | INTRAVENOUS | Status: AC
  Filled 2022-09-16: qty 1000

## 2022-09-16 NOTE — Progress Notes (Signed)
Diagnosis:  nausea and vomiting during pregnancy      Provider:  Marshell Garfinkel MD  Procedure: Infusion  IV Type: Peripheral, IV Location: R Antecubital  Lactated Ringers, Dose: 1000 ml Phenergan 12.'5mg'$   Infusion Start Time: 1409  Infusion Stop Time: 1513  Post Infusion IV Care: Peripheral IV Discontinued  Discharge: Condition: Good, Destination: Home . AVS Declined  Performed by:  Fraser Din Pilkington-Burchett, RN

## 2022-09-19 ENCOUNTER — Ambulatory Visit (INDEPENDENT_AMBULATORY_CARE_PROVIDER_SITE_OTHER)

## 2022-09-19 VITALS — BP 112/71 | HR 111 | Temp 98.3°F | Resp 18 | Ht 63.0 in | Wt 192.8 lb

## 2022-09-19 DIAGNOSIS — O219 Vomiting of pregnancy, unspecified: Secondary | ICD-10-CM

## 2022-09-19 DIAGNOSIS — Z3A16 16 weeks gestation of pregnancy: Secondary | ICD-10-CM | POA: Diagnosis not present

## 2022-09-19 MED ORDER — SODIUM CHLORIDE 0.9 % IV SOLN
Freq: Once | INTRAVENOUS | Status: AC
  Filled 2022-09-19: qty 1000

## 2022-09-19 NOTE — Progress Notes (Signed)
Diagnosis: Nausea and Vomiting during pregnancy  Provider:  Marshell Garfinkel MD  Procedure: Infusion  IV Type: Peripheral, IV Location: L Antecubital  Lactated Ringers With Phenergan, Dose: 1000 ml in 12.'5mg'$   Infusion Start Time: 1500  Infusion Stop Time: 1607  Post Infusion IV Care: Peripheral IV Discontinued  Discharge: Condition: Good, Destination: Home . AVS Declined  Performed by:  Cleophus Molt, RN

## 2022-09-23 ENCOUNTER — Encounter: Payer: Self-pay | Admitting: Obstetrics & Gynecology

## 2022-09-23 ENCOUNTER — Ambulatory Visit (INDEPENDENT_AMBULATORY_CARE_PROVIDER_SITE_OTHER)

## 2022-09-23 ENCOUNTER — Other Ambulatory Visit (INDEPENDENT_AMBULATORY_CARE_PROVIDER_SITE_OTHER)

## 2022-09-23 VITALS — BP 123/84 | HR 112 | Temp 97.8°F | Resp 18 | Ht 63.0 in | Wt 188.8 lb

## 2022-09-23 DIAGNOSIS — Z3A16 16 weeks gestation of pregnancy: Secondary | ICD-10-CM

## 2022-09-23 DIAGNOSIS — O219 Vomiting of pregnancy, unspecified: Secondary | ICD-10-CM | POA: Diagnosis not present

## 2022-09-23 DIAGNOSIS — R399 Unspecified symptoms and signs involving the genitourinary system: Secondary | ICD-10-CM

## 2022-09-23 MED ORDER — SODIUM CHLORIDE 0.9 % IV SOLN
Freq: Once | INTRAVENOUS | Status: AC
  Filled 2022-09-23: qty 1000

## 2022-09-23 NOTE — Progress Notes (Signed)
Pt here for nurse visit for UTI symptoms. Pt given lab order and went to lab for urine culture.

## 2022-09-23 NOTE — Progress Notes (Signed)
Diagnosis: Nausea/Vomiting of pregnancy  Provider:  Marshell Garfinkel MD  Procedure: Infusion  IV Type: Peripheral, IV Location: L Antecubital  Normal Saline with Phenergan, Dose: 1000 ml  Infusion Start Time: 1520  Infusion Stop Time: [id number]  Post Infusion IV Care: Peripheral IV Discontinued  Discharge: Condition: Good, Destination: Home . AVS Provided and AVS Declined  Performed by:  Arnoldo Morale, RN

## 2022-09-25 ENCOUNTER — Other Ambulatory Visit: Payer: Self-pay

## 2022-09-25 ENCOUNTER — Inpatient Hospital Stay (HOSPITAL_COMMUNITY)
Admission: AD | Admit: 2022-09-25 | Discharge: 2022-09-25 | Disposition: A | Discharge status: Home / Self Care | Attending: Obstetrics & Gynecology | Admitting: Obstetrics & Gynecology

## 2022-09-25 ENCOUNTER — Encounter (HOSPITAL_COMMUNITY): Payer: Self-pay | Admitting: Obstetrics & Gynecology

## 2022-09-25 ENCOUNTER — Telehealth: Payer: Self-pay

## 2022-09-25 DIAGNOSIS — B9689 Other specified bacterial agents as the cause of diseases classified elsewhere: Secondary | ICD-10-CM | POA: Insufficient documentation

## 2022-09-25 DIAGNOSIS — O219 Vomiting of pregnancy, unspecified: Secondary | ICD-10-CM | POA: Diagnosis not present

## 2022-09-25 DIAGNOSIS — Z3480 Encounter for supervision of other normal pregnancy, unspecified trimester: Secondary | ICD-10-CM

## 2022-09-25 DIAGNOSIS — M545 Low back pain, unspecified: Secondary | ICD-10-CM | POA: Insufficient documentation

## 2022-09-25 DIAGNOSIS — Z3A16 16 weeks gestation of pregnancy: Secondary | ICD-10-CM | POA: Diagnosis not present

## 2022-09-25 DIAGNOSIS — R002 Palpitations: Secondary | ICD-10-CM | POA: Diagnosis not present

## 2022-09-25 DIAGNOSIS — O2342 Unspecified infection of urinary tract in pregnancy, second trimester: Secondary | ICD-10-CM | POA: Insufficient documentation

## 2022-09-25 DIAGNOSIS — N390 Urinary tract infection, site not specified: Secondary | ICD-10-CM

## 2022-09-25 DIAGNOSIS — O26892 Other specified pregnancy related conditions, second trimester: Secondary | ICD-10-CM | POA: Insufficient documentation

## 2022-09-25 DIAGNOSIS — O2340 Unspecified infection of urinary tract in pregnancy, unspecified trimester: Secondary | ICD-10-CM

## 2022-09-25 DIAGNOSIS — O26899 Other specified pregnancy related conditions, unspecified trimester: Secondary | ICD-10-CM

## 2022-09-25 DIAGNOSIS — R1030 Lower abdominal pain, unspecified: Secondary | ICD-10-CM | POA: Diagnosis not present

## 2022-09-25 HISTORY — DX: Urinary tract infection, site not specified: N39.0

## 2022-09-25 LAB — URINALYSIS, ROUTINE W REFLEX MICROSCOPIC
Bilirubin Urine: NEGATIVE
Glucose, UA: NEGATIVE mg/dL
Ketones, ur: 20 mg/dL — AB
Nitrite: POSITIVE — AB
Protein, ur: 100 mg/dL — AB
Specific Gravity, Urine: 1.014 (ref 1.005–1.030)
WBC, UA: >50 WBC/hpf (ref 0–5)
pH: 5 (ref 5.0–8.0)

## 2022-09-25 LAB — BASIC METABOLIC PANEL
Anion gap: 13 (ref 5–15)
BUN: 6 mg/dL (ref 6–20)
CO2: 19 mmol/L — ABNORMAL LOW (ref 22–32)
Calcium: 8.8 mg/dL — ABNORMAL LOW (ref 8.9–10.3)
Chloride: 104 mmol/L (ref 98–111)
Creatinine, Ser: 0.5 mg/dL (ref 0.44–1.00)
GFR, Estimated: >60 mL/min (ref >60)
Glucose, Bld: 92 mg/dL (ref 70–99)
Potassium: 3.5 mmol/L (ref 3.5–5.1)
Sodium: 136 mmol/L (ref 135–145)

## 2022-09-25 LAB — CBC WITH DIFFERENTIAL/PLATELET
Abs Immature Granulocytes: 0.02 10*3/uL (ref 0.00–0.07)
Basophils Absolute: 0 10*3/uL (ref 0.0–0.1)
Basophils Relative: 0 %
Eosinophils Absolute: 0 10*3/uL (ref 0.0–0.5)
Eosinophils Relative: 0 %
HCT: 33.7 % — ABNORMAL LOW (ref 36.0–46.0)
Hemoglobin: 11 g/dL — ABNORMAL LOW (ref 12.0–15.0)
Immature Granulocytes: 0 %
Lymphocytes Relative: 11 %
Lymphs Abs: 0.8 10*3/uL (ref 0.7–4.0)
MCH: 26.8 pg (ref 26.0–34.0)
MCHC: 32.6 g/dL (ref 30.0–36.0)
MCV: 82.2 fL (ref 80.0–100.0)
Monocytes Absolute: 0.5 10*3/uL (ref 0.1–1.0)
Monocytes Relative: 7 %
Neutro Abs: 5.5 10*3/uL (ref 1.7–7.7)
Neutrophils Relative %: 82 %
Platelets: 201 10*3/uL (ref 150–400)
RBC: 4.1 MIL/uL (ref 3.87–5.11)
RDW: 14.2 % (ref 11.5–15.5)
WBC: 6.8 10*3/uL (ref 4.0–10.5)
nRBC: 0 % (ref 0.0–0.2)

## 2022-09-25 MED ORDER — SODIUM CHLORIDE 0.9 % IV SOLN
25.0000 mg | Freq: Once | INTRAVENOUS | Status: DC
  Filled 2022-09-25: qty 1

## 2022-09-25 MED ORDER — CEFADROXIL 500 MG PO CAPS: 500.0000 mg | ORAL_CAPSULE | Freq: Two times a day (BID) | ORAL | 0 refills | Status: DC

## 2022-09-25 MED ORDER — CYCLOBENZAPRINE HCL 10 MG PO TABS: 10.0000 mg | ORAL_TABLET | Freq: Two times a day (BID) | ORAL | 0 refills | Status: DC | PRN

## 2022-09-25 MED ORDER — SODIUM CHLORIDE 0.9 % IV SOLN
1.0000 g | Freq: Once | INTRAVENOUS | Status: AC
  Administered 2022-09-25: 1 g via INTRAVENOUS
  Filled 2022-09-25: qty 10

## 2022-09-25 MED ORDER — SODIUM CHLORIDE 0.9 % IV BOLUS
1000.0000 mL | Freq: Once | INTRAVENOUS | Status: AC
  Administered 2022-09-25: 1000 mL via INTRAVENOUS

## 2022-09-25 MED ORDER — MORPHINE SULFATE (PF) 4 MG/ML IV SOLN
4.0000 mg | Freq: Once | INTRAVENOUS | Status: AC
  Administered 2022-09-25: 4 mg via INTRAVENOUS
  Filled 2022-09-25: qty 1

## 2022-09-25 MED ORDER — PROMETHAZINE HCL 25 MG/ML IJ SOLN
25.0000 mg | Freq: Once | INTRAVENOUS | Status: AC
  Administered 2022-09-25: 25 mg via INTRAVENOUS
  Filled 2022-09-25: qty 1

## 2022-09-25 MED ORDER — LACTATED RINGERS IV BOLUS: 1000.0000 mL | Freq: Once | INTRAVENOUS | Status: DC

## 2022-09-25 MED ORDER — CYCLOBENZAPRINE HCL 5 MG PO TABS
10.0000 mg | ORAL_TABLET | Freq: Once | ORAL | Status: AC
  Administered 2022-09-25: 10 mg via ORAL
  Filled 2022-09-25: qty 2

## 2022-09-25 MED ORDER — ACETAMINOPHEN 500 MG PO TABS
1000.0000 mg | ORAL_TABLET | Freq: Once | ORAL | Status: AC
  Administered 2022-09-25: 1000 mg via ORAL
  Filled 2022-09-25: qty 2

## 2022-09-25 NOTE — Telephone Encounter (Signed)
Pt called asking if urine culture results were back yet. Results are still pending. Pt is tearful stating she feels horrible and is afraid urinary infection is worsening. Pt does have low grade fever and back pain. Pt states she has lost 5 pounds in the last week due to vomiting. We do not have a provider in the office today. Pt was advised to go to MAU in case she needs IV fluids. Pt expressed understanding.

## 2022-09-25 NOTE — MAU Provider Note (Signed)
History     CSN: [id number]  Arrival date and time: 09/25/22 1210   None     Chief Complaint  Patient presents with   Nausea   Emesis   Back Pain   April Benson is a 27 y.o. G2P1001 at 58w6dwho [facility]  She presents today for UTI symptoms. Patient states that she was seen Monday for urinary frequency, burning, and odor.  She reports she was sent to the lab for a culture, but wasn't sent any antibiotics.  She states she has been taking AZO with some improvement in the burning, but not the frequency. However, patient states that she is also have lower back pain that was initially dull, aching but is now worsening.  She also reports that this morning she started to have some cramping that was initially intermittent, but is now sharp and constant.  She states her pain is a 7/10 despite tylenol XR dosing Q4hrs with no improvement. Patient also endorses some nausea and vomiting despite Zofran dosing at 0900.  She states she also has Reglan and Phenergan at home, but was unable to take it.  She further reports that d/t hyperemesis she is receiving twice weekly IV fluid infusions. Patient also reports some palpitations, but reports she did not take her inderal today.   OB History     Gravida  2   Para  1   Term  1   Preterm      AB      Living  1      SAB      IAB      Ectopic      Multiple  0   Live Births  1           Past Medical History:  Diagnosis Date   Hypertension    Hyperthyroidism    Tachycardia     Past Surgical History:  Procedure Laterality Date   NO PAST SURGERIES      Family History  Problem Relation Age of Onset   Hypertension Mother    Diabetes Mother    Hypothyroidism Mother    GBerenice Primas disease Mother     Social History   Tobacco Use   Smoking status: Never   Smokeless tobacco: Never  Vaping Use   Vaping Use: Never used  Substance Use Topics   Alcohol use: Not Currently   Drug use: Never    Allergies: No  Known Allergies  Facility-Administered Medications Prior to Admission  Medication Dose Route Frequency Provider Last Rate Last Admin   promethazine (PHENERGAN) injection 12.5 mg  12.5 mg Intravenous Once LGuss Bunde MD       Medications Prior to Admission  Medication Sig Dispense Refill Last Dose   metoCLOPramide (REGLAN) 10 MG tablet Take 1 tablet (10 mg total) by mouth every 6 (six) hours. 120 tablet 3 09/24/2022   ondansetron (ZOFRAN-ODT) 8 MG disintegrating tablet Take 1 tablet (8 mg total) by mouth every 8 (eight) hours as needed for nausea or vomiting. 90 tablet 3 09/25/2022 at 0900   polyethylene glycol (MIRALAX / GLYCOLAX) 17 g packet Take 17 g by mouth daily. 30 packet 3 09/24/2022   propranolol (INDERAL) 60 MG tablet Take by mouth.   09/24/2022   propylthiouracil (PTU) 50 MG tablet Take by mouth.   09/24/2022   aspirin EC 81 MG tablet Take 1 tablet (81 mg total) by mouth daily. Start taking when you are [redacted] weeks pregnant for rest  of pregnancy for prevention of preeclampsia 300 tablet 2 0000000   folic acid (FOLATE) A999333 MCG tablet Take 1 tablet (400 mcg total) by mouth daily. 90 tablet 3 09/23/2022    Review of Systems  Gastrointestinal:  Positive for abdominal pain (Cramping), nausea and vomiting.  Genitourinary:  Positive for dysuria and frequency. Negative for difficulty urinating, vaginal bleeding and vaginal discharge.  Musculoskeletal:  Positive for back pain.  Neurological:  Positive for dizziness. Negative for headaches.   Physical Exam   Blood pressure 130/79, pulse (!) 138, temperature 99 F (37.2 C), temperature source Oral, resp. rate 19, height '5\' 3"'$  (1.6 m), weight 85.6 kg, last menstrual period 05/30/2022, SpO2 96 %, unknown if currently breastfeeding.  Physical Exam Vitals reviewed.  Constitutional:      General: She is in acute distress.     Appearance: Normal appearance.  HENT:     Head: Normocephalic and atraumatic.  Eyes:     Conjunctiva/sclera:  Conjunctivae normal.  Cardiovascular:     Rate and Rhythm: Normal rate.  Pulmonary:     Effort: Pulmonary effort is normal. No respiratory distress.  Abdominal:     Palpations: Abdomen is soft.     Tenderness: There is abdominal tenderness in the suprapubic area.  Musculoskeletal:        General: Normal range of motion.     Cervical back: Normal range of motion.  Skin:    General: Skin is warm and dry.  Neurological:     Mental Status: She is alert and oriented to person, place, and time.  Psychiatric:        Mood and Affect: Mood normal.        Behavior: Behavior normal.     MAU Course  Procedures Results for orders placed or performed during the hospital encounter of 09/25/22 (from the past 24 hour(s))  Urinalysis, Routine w reflex microscopic -Urine, Clean Catch     Status: Abnormal   Collection Time: 09/25/22  1:30 PM  Result Value Ref Range   Color, Urine April Benson (A) YELLOW   APPearance CLOUDY (A) CLEAR   Specific Gravity, Urine 1.014 1.005 - 1.030   pH 5.0 5.0 - 8.0   Glucose, UA NEGATIVE NEGATIVE mg/dL   Hgb urine dipstick SMALL (A) NEGATIVE   Bilirubin Urine NEGATIVE NEGATIVE   Ketones, ur 20 (A) NEGATIVE mg/dL   Protein, ur 100 (A) NEGATIVE mg/dL   Nitrite POSITIVE (A) NEGATIVE   Leukocytes,Ua SMALL (A) NEGATIVE   RBC / HPF 11-20 0 - 5 RBC/hpf   WBC, UA >50 0 - 5 WBC/hpf   Bacteria, UA MANY (A) NONE SEEN   Squamous Epithelial / HPF 0-5 0 - 5 /HPF   Mucus PRESENT   Basic metabolic panel     Status: Abnormal   Collection Time: 09/25/22  2:41 PM  Result Value Ref Range   Sodium 136 135 - 145 mmol/L   Potassium 3.5 3.5 - 5.1 mmol/L   Chloride 104 98 - 111 mmol/L   CO2 19 (L) 22 - 32 mmol/L   Glucose, Bld 92 70 - 99 mg/dL   BUN 6 6 - 20 mg/dL   Creatinine, Ser 0.50 0.44 - 1.00 mg/dL   Calcium 8.8 (L) 8.9 - 10.3 mg/dL   GFR, Estimated >60 >60 mL/min   Anion gap 13 5 - 15  CBC with Differential/Platelet     Status: Abnormal   Collection Time: 09/25/22  2:41 PM   Result Value Ref Range   WBC  6.8 4.0 - 10.5 K/uL   RBC 4.10 3.87 - 5.11 MIL/uL   Hemoglobin 11.0 (L) 12.0 - 15.0 g/dL   HCT 33.7 (L) 36.0 - 46.0 %   MCV 82.2 80.0 - 100.0 fL   MCH 26.8 26.0 - 34.0 pg   MCHC 32.6 30.0 - 36.0 g/dL   RDW 14.2 11.5 - 15.5 %   Platelets 201 150 - 400 K/uL   nRBC 0.0 0.0 - 0.2 %   Neutrophils Relative % 82 %   Neutro Abs 5.5 1.7 - 7.7 K/uL   Lymphocytes Relative 11 %   Lymphs Abs 0.8 0.7 - 4.0 K/uL   Monocytes Relative 7 %   Monocytes Absolute 0.5 0.1 - 1.0 K/uL   Eosinophils Relative 0 %   Eosinophils Absolute 0.0 0.0 - 0.5 K/uL   Basophils Relative 0 %   Basophils Absolute 0.0 0.0 - 0.1 K/uL   Immature Granulocytes 0 %   Abs Immature Granulocytes 0.02 0.00 - 0.07 K/uL   Patient informed that the ultrasound is considered a limited OB ultrasound and is not intended to be a complete ultrasound exam.  Patient also informed that the ultrasound is not being completed with the intent of assessing for fetal or placental anomalies or any pelvic abnormalities.  Explained that the purpose of today's ultrasound is to assess for   maternal reassurance and viability.  Patient acknowledges the purpose of the exam and the limitations of the study.  SIUP c/w datings. FHR 146      MDM Labs: UA, UC, BMP, CBC/D Start IV  Antiemetic with LR NS Bolus Antibiotic Pain Medications Muscle Relaxants Prescriptions Assessment and Plan  27 year old, G2P1001  SIUP at 16.6 weeks N/V UTI Back Pain Abdominal Cramping  -UA ordered returns c/w UTI. -Provider to bedside to discuss results and patient with complaints per HPI.  -Reviewed POC with patient. -Informed that d/t c/o pain and nausea will give pain medication and antiemetic.  -Further discussed concern with ongoing nausea and taking oral antibiotics so would recommend an IV course of Rocephin. Patient agreeable -Exam performed.  -Start IV and collect labs to r/o infection and electrolyte imbalance. -Rocephin  1gram ordered. Plan to discharge home on Duricef, but short course-likely 5 days considering ongoing nausea. -Phenergan '25mg'$  in LR ordered. -Give Normal Saline Bolus -Plan for oral vs IV pain medication  -Plan to perform BSUS for maternal reassurance and viability. -Monitor and reassess.   Maryann Conners 09/25/2022, 2:43 PM   Reassessment (3:31 PM) -Patient reports pain ongoing, but nausea has not improved  as phenergan infusion not yet available.  -Discussed treatment of pain with IV morphine and patient agreeable. Cautioned that may worsening nausea. -'4mg'$  ordered. -Monitor and reassess.   Reassessment (4:15 PM) -Patient reports pain with some improvement and now 5/10. -Assessed if manageable and patient states it is not. -Reviewed usage of flexeril and tylenol. Patient agreeable. -Infusions continuing and patient states nausea "okay." -Continue to monitor.   Reassessment (6:45 PM) -Patient reports no nausea and improvement in pain. -Discussed usage of tylenol, at home, for abdominal cramping. -Will also send limited supply of flexeril for use.  -Discussed taking oral antibiotic and notifying office if unable to complete.  -Plan to keep all appts as scheduled. -Encouraged rest and hydration as tolerated. -Precautions review.  -BSUS completed and results as above.  -Encouraged to call primary office or return to MAU if symptoms worsen or with the onset of new symptoms. -Discharged to home in  stable condition.  Maryann Conners MSN, CNM Advanced Practice Provider, [facility]

## 2022-09-25 NOTE — MAU Note (Addendum)
April Benson is a 27 y.o. at 105w6dhere in MAU reporting: reports thinks may have UTI, waiting for urine culture results, not given antibiotics.  Reports continues to have urinary frequency and burning, taking Azo 3x per day (took this morning).  States also has lower bilateral back pain with lower pelvic/ abdominal pain.  Also c/o N/V, taking Zofran, Phenergan, and Reglan.  States gets IVF's twice per week, getting fluids tomorrow. LMP: NA Onset of complaint: Monday Pain score: 7 Vitals:   09/25/22 1233  BP: 138/76  Pulse: (!) 130  Resp: 19  Temp: 99 F (37.2 C)  SpO2: 96%     FHT:153 bpm Lab orders placed from triage:   UA

## 2022-09-26 ENCOUNTER — Ambulatory Visit

## 2022-09-26 LAB — URINE CULTURE, OB REFLEX

## 2022-09-26 LAB — CULTURE, OB URINE

## 2022-09-26 MED ORDER — SODIUM CHLORIDE 0.9 % IV SOLN
Freq: Once | INTRAVENOUS | Status: AC
  Filled 2022-09-26: qty 1000

## 2022-09-27 ENCOUNTER — Inpatient Hospital Stay (HOSPITAL_COMMUNITY)
Admission: AD | Admit: 2022-09-27 | Discharge: 2022-09-27 | Disposition: A | Discharge status: Home / Self Care | Attending: Obstetrics and Gynecology | Admitting: Obstetrics and Gynecology

## 2022-09-27 ENCOUNTER — Ambulatory Visit (INDEPENDENT_AMBULATORY_CARE_PROVIDER_SITE_OTHER)

## 2022-09-27 ENCOUNTER — Encounter (HOSPITAL_COMMUNITY): Payer: Self-pay | Admitting: Obstetrics and Gynecology

## 2022-09-27 VITALS — BP 117/79 | HR 110 | Temp 98.3°F | Resp 22 | Ht 63.0 in | Wt 185.4 lb

## 2022-09-27 DIAGNOSIS — O219 Vomiting of pregnancy, unspecified: Secondary | ICD-10-CM | POA: Diagnosis present

## 2022-09-27 DIAGNOSIS — O99282 Endocrine, nutritional and metabolic diseases complicating pregnancy, second trimester: Secondary | ICD-10-CM | POA: Diagnosis not present

## 2022-09-27 DIAGNOSIS — Z3A17 17 weeks gestation of pregnancy: Secondary | ICD-10-CM | POA: Insufficient documentation

## 2022-09-27 DIAGNOSIS — E0590 Thyrotoxicosis, unspecified without thyrotoxic crisis or storm: Secondary | ICD-10-CM | POA: Insufficient documentation

## 2022-09-27 DIAGNOSIS — O210 Mild hyperemesis gravidarum: Secondary | ICD-10-CM | POA: Diagnosis not present

## 2022-09-27 LAB — URINALYSIS, ROUTINE W REFLEX MICROSCOPIC
Bilirubin Urine: NEGATIVE
Glucose, UA: NEGATIVE mg/dL
Hgb urine dipstick: NEGATIVE
Ketones, ur: 80 mg/dL — AB
Nitrite: NEGATIVE
Protein, ur: 30 mg/dL — AB
Specific Gravity, Urine: 1.013 (ref 1.005–1.030)
WBC, UA: >50 WBC/hpf (ref 0–5)
pH: 5 (ref 5.0–8.0)

## 2022-09-27 LAB — COMPREHENSIVE METABOLIC PANEL
ALT: 17 U/L (ref 0–44)
AST: 19 U/L (ref 15–41)
Albumin: 2.4 g/dL — ABNORMAL LOW (ref 3.5–5.0)
Alkaline Phosphatase: 141 U/L — ABNORMAL HIGH (ref 38–126)
Anion gap: 13 (ref 5–15)
BUN: <5 mg/dL — ABNORMAL LOW (ref 6–20)
CO2: 18 mmol/L — ABNORMAL LOW (ref 22–32)
Calcium: 8.4 mg/dL — ABNORMAL LOW (ref 8.9–10.3)
Chloride: 105 mmol/L (ref 98–111)
Creatinine, Ser: 0.45 mg/dL (ref 0.44–1.00)
GFR, Estimated: >60 mL/min (ref >60)
Glucose, Bld: 91 mg/dL (ref 70–99)
Potassium: 3.4 mmol/L — ABNORMAL LOW (ref 3.5–5.1)
Sodium: 136 mmol/L (ref 135–145)
Total Bilirubin: 0.9 mg/dL (ref 0.3–1.2)
Total Protein: 6.1 g/dL — ABNORMAL LOW (ref 6.5–8.1)

## 2022-09-27 LAB — CBC
HCT: 29.3 % — ABNORMAL LOW (ref 36.0–46.0)
Hemoglobin: 9.6 g/dL — ABNORMAL LOW (ref 12.0–15.0)
MCH: 27.1 pg (ref 26.0–34.0)
MCHC: 32.8 g/dL (ref 30.0–36.0)
MCV: 82.8 fL (ref 80.0–100.0)
Platelets: 192 10*3/uL (ref 150–400)
RBC: 3.54 MIL/uL — ABNORMAL LOW (ref 3.87–5.11)
RDW: 14.2 % (ref 11.5–15.5)
WBC: 3.7 10*3/uL — ABNORMAL LOW (ref 4.0–10.5)
nRBC: 0 % (ref 0.0–0.2)

## 2022-09-27 LAB — CULTURE, OB URINE: Culture: >=100000 — AB

## 2022-09-27 LAB — T4, FREE: Free T4: 2.88 ng/dL — ABNORMAL HIGH (ref 0.61–1.12)

## 2022-09-27 LAB — TSH: TSH: <0.01 u[IU]/mL — ABNORMAL LOW (ref 0.350–4.500)

## 2022-09-27 MED ORDER — FAMOTIDINE IN NACL 20-0.9 MG/50ML-% IV SOLN
20.0000 mg | Freq: Once | INTRAVENOUS | Status: AC
  Administered 2022-09-27: 20 mg via INTRAVENOUS
  Filled 2022-09-27: qty 50

## 2022-09-27 MED ORDER — SODIUM CHLORIDE 0.9 % IV BOLUS
1000.0000 mL | Freq: Once | INTRAVENOUS | Status: AC
  Administered 2022-09-27: 1000 mL via INTRAVENOUS

## 2022-09-27 MED ORDER — SCOPOLAMINE 1 MG/3DAYS TD PT72: MEDICATED_PATCH | TRANSDERMAL | 0 refills | Status: DC

## 2022-09-27 MED ORDER — SCOPOLAMINE 1 MG/3DAYS TD PT72
1.0000 | MEDICATED_PATCH | Freq: Once | TRANSDERMAL | Status: DC
  Administered 2022-09-27: 1.5 mg via TRANSDERMAL
  Filled 2022-09-27: qty 1

## 2022-09-27 MED ORDER — SODIUM CHLORIDE 0.9 % IV SOLN
Freq: Once | INTRAVENOUS | Status: AC
  Filled 2022-09-27: qty 1000

## 2022-09-27 MED ORDER — SODIUM CHLORIDE 0.9 % IV SOLN
25.0000 mg | Freq: Once | INTRAVENOUS | Status: AC
  Administered 2022-09-27: 25 mg via INTRAVENOUS
  Filled 2022-09-27: qty 1

## 2022-09-27 MED ORDER — SODIUM CHLORIDE 0.9 % IV SOLN
2.0000 g | Freq: Once | INTRAVENOUS | Status: AC
  Administered 2022-09-27: 2 g via INTRAVENOUS
  Filled 2022-09-27: qty 20

## 2022-09-27 NOTE — Progress Notes (Signed)
Diagnosis: Nausea and vomiting in pregnancy, antepartum  Provider:  Marshell Garfinkel MD  Procedure: Infusion  IV Type: Peripheral, IV Location: R Forearm  Phenergan, Dose: 12.5 mg   in 1000 mL NS  Infusion Start Time: 1525  Infusion Stop Time: [id number]  Post Infusion IV Care: Patient declined observation and Peripheral IV Discontinued  Discharge: Condition: Stable, Destination: Home . AVS Declined  Performed by:  Binnie Kand, RN

## 2022-09-27 NOTE — MAU Provider Note (Signed)
History     [id number]  Arrival date and time: 09/27/22 1717    Chief Complaint  Patient presents with   Emesis     HPI April Benson is a 27 y.o. at 54w1dby LMP who presents for nausea & vomiting. 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  Reports continued nausea & vomiting throughout the pregnancy that worsened on Wednesday. Is going for outpatient infusions twice weekly - last infusion was today. States she hasn't been able to keep anything down since Wednesday. Tried to eat a cracker this afternoon but immediately vomited. Tried to take zofran this morning. Unable to keep down any pills. Was able to keep down abx & thyroid meds yesterday. Currently being treated for UTI- symptoms improved since last visit.  Denies palpitations, fever, flank pain, abdominal pain, vaginal bleeding, diarrhea.    Review of records from Care Everywhere: [facility]. Last seen 2/22. Labs were TSH <0.005, T4 2.26, T3 13.8. Currently on PTU 400 TID & propanolol 120 BID.  A/Positive/-- (01/22 1158)  OB History     Gravida  2   Para  1   Term  1   Preterm      AB      Living  1      SAB      IAB      Ectopic      Multiple  0   Live Births  1           Past Medical History:  Diagnosis Date   Hypertension    Hyperthyroidism    Tachycardia     Past Surgical History:  Procedure Laterality Date   NO PAST SURGERIES      Family History  Problem Relation Age of Onset   Hypertension Mother    Diabetes Mother    Hypothyroidism Mother    Graves' disease Mother     Social History   Socioeconomic History   Marital status: Married    Spouse name: Not on file   Number of children: Not on file   Years of education: Not on file   Highest education level: Not on file  Occupational History   Not on file  Tobacco Use   Smoking status: Never   Smokeless tobacco: Never  Vaping Use   Vaping Use:  Never used  Substance and Sexual Activity   Alcohol use: Not Currently   Drug use: Never   Sexual activity: Not Currently    Partners: Male  Other Topics Concern   Not on file  Social History Narrative   ** Merged History Encounter **       Social Determinants of Health   Financial Resource Strain: Not on file  Food Insecurity: Not on file  Transportation Needs: Not on file  Physical Activity: Not on file  Stress: Not on file  Social Connections: Not on file  Intimate Partner Violence: Not on file    No Known Allergies  Current Facility-Administered Medications on File Prior to Encounter  Medication Dose Route Frequency Provider Last Rate Last Admin   promethazine (PHENERGAN) injection 12.5 mg  12.5 mg Intravenous Once LGuss Bunde MD       sodium chloride 0.9 % 1,000 mL with promethazine (PHENERGAN) 12.5 mg infusion   Intravenous Once LGuss Bunde MD       Current Outpatient Medications on File Prior to Encounter  Medication Sig Dispense  Refill   aspirin EC 81 MG tablet Take 1 tablet (81 mg total) by mouth daily. Start taking when you are [redacted] weeks pregnant for rest of pregnancy for prevention of preeclampsia 300 tablet 2   cyclobenzaprine (FLEXERIL) 10 MG tablet Take 1 tablet (10 mg total) by mouth 2 (two) times daily as needed for muscle spasms. 20 tablet 0   folic acid (FOLATE) A999333 MCG tablet Take 1 tablet (400 mcg total) by mouth daily. 90 tablet 3   metoCLOPramide (REGLAN) 10 MG tablet Take 1 tablet (10 mg total) by mouth every 6 (six) hours. 120 tablet 3   ondansetron (ZOFRAN-ODT) 8 MG disintegrating tablet Take 1 tablet (8 mg total) by mouth every 8 (eight) hours as needed for nausea or vomiting. 90 tablet 3   polyethylene glycol (MIRALAX / GLYCOLAX) 17 g packet Take 17 g by mouth daily. 30 packet 3   propranolol (INDERAL) 60 MG tablet Take by mouth.     propylthiouracil (PTU) 50 MG tablet Take by mouth.       ROS Pertinent positives and negative per HPI,  all others reviewed and negative  Physical Exam   BP (!) 124/52   Pulse (!) 114   Temp 98.1 F (36.7 C) (Oral)   Resp 20   Ht '5\' 3"'$  (1.6 m)   Wt 85.4 kg   LMP 05/30/2022 (Exact Date)   SpO2 97%   BMI 33.34 kg/m   Patient Vitals for the past 24 hrs:  BP Temp Temp src Pulse Resp SpO2 Height Weight  09/27/22 2053 (!) 124/52 -- -- (!) 114 -- -- -- --  09/27/22 1802 131/61 -- -- (!) 107 -- -- -- --  09/27/22 1758 129/62 98.1 F (36.7 C) Oral (!) 109 20 97 % '5\' 3"'$  (1.6 m) 85.4 kg    Physical Exam Vitals and nursing note reviewed.  Constitutional:      General: She is not in acute distress.    Appearance: She is well-developed. She is not ill-appearing or diaphoretic.  HENT:     Head: Normocephalic and atraumatic.  Eyes:     General: No scleral icterus.       Right eye: No discharge.        Left eye: No discharge.     Conjunctiva/sclera: Conjunctivae normal.  Cardiovascular:     Rate and Rhythm: Normal rate and regular rhythm.     Heart sounds: Normal heart sounds.  Pulmonary:     Effort: Pulmonary effort is normal. No respiratory distress.  Abdominal:     Tenderness: There is no right CVA tenderness or left CVA tenderness.  Musculoskeletal:     Right lower leg: No edema.     Left lower leg: No edema.  Skin:    General: Skin is warm and dry.     Coloration: Skin is pale.  Neurological:     General: No focal deficit present.     Mental Status: She is alert.  Psychiatric:        Mood and Affect: Mood normal.        Behavior: Behavior normal.      Labs Results for orders placed or performed during the hospital encounter of 09/27/22 (from the past 24 hour(s))  Urinalysis, Routine w reflex microscopic -Urine, Clean Catch     Status: Abnormal   Collection Time: 09/27/22  6:45 PM  Result Value Ref Range   Color, Urine Charleen (A) YELLOW   APPearance CLOUDY (A) CLEAR   Specific Gravity,  Urine 1.013 1.005 - 1.030   pH 5.0 5.0 - 8.0   Glucose, UA NEGATIVE NEGATIVE  mg/dL   Hgb urine dipstick NEGATIVE NEGATIVE   Bilirubin Urine NEGATIVE NEGATIVE   Ketones, ur 80 (A) NEGATIVE mg/dL   Protein, ur 30 (A) NEGATIVE mg/dL   Nitrite NEGATIVE NEGATIVE   Leukocytes,Ua LARGE (A) NEGATIVE   RBC / HPF 6-10 0 - 5 RBC/hpf   WBC, UA >50 0 - 5 WBC/hpf   Bacteria, UA FEW (A) NONE SEEN   Squamous Epithelial / HPF 21-50 0 - 5 /HPF   Mucus PRESENT   Comprehensive metabolic panel     Status: Abnormal   Collection Time: 09/27/22  6:50 PM  Result Value Ref Range   Sodium 136 135 - 145 mmol/L   Potassium 3.4 (L) 3.5 - 5.1 mmol/L   Chloride 105 98 - 111 mmol/L   CO2 18 (L) 22 - 32 mmol/L   Glucose, Bld 91 70 - 99 mg/dL   BUN <5 (L) 6 - 20 mg/dL   Creatinine, Ser 0.45 0.44 - 1.00 mg/dL   Calcium 8.4 (L) 8.9 - 10.3 mg/dL   Total Protein 6.1 (L) 6.5 - 8.1 g/dL   Albumin 2.4 (L) 3.5 - 5.0 g/dL   AST 19 15 - 41 U/L   ALT 17 0 - 44 U/L   Alkaline Phosphatase 141 (H) 38 - 126 U/L   Total Bilirubin 0.9 0.3 - 1.2 mg/dL   GFR, Estimated >60 >60 mL/min   Anion gap 13 5 - 15  CBC     Status: Abnormal   Collection Time: 09/27/22  6:50 PM  Result Value Ref Range   WBC 3.7 (L) 4.0 - 10.5 K/uL   RBC 3.54 (L) 3.87 - 5.11 MIL/uL   Hemoglobin 9.6 (L) 12.0 - 15.0 g/dL   HCT 29.3 (L) 36.0 - 46.0 %   MCV 82.8 80.0 - 100.0 fL   MCH 27.1 26.0 - 34.0 pg   MCHC 32.8 30.0 - 36.0 g/dL   RDW 14.2 11.5 - 15.5 %   Platelets 192 150 - 400 K/uL   nRBC 0.0 0.0 - 0.2 %  TSH     Status: Abnormal   Collection Time: 09/27/22  6:50 PM  Result Value Ref Range   TSH <0.010 (L) 0.350 - 4.500 uIU/mL  T4, free     Status: Abnormal   Collection Time: 09/27/22  6:50 PM  Result Value Ref Range   Free T4 2.88 (H) 0.61 - 1.12 ng/dL    Imaging No results found.  MAU Course  Procedures Lab Orders         Comprehensive metabolic panel         CBC         TSH         T4, free         T3, free         Urinalysis, Routine w reflex microscopic -Urine, Clean Catch     Meds ordered this  encounter  Medications   sodium chloride 0.9 % bolus 1,000 mL   scopolamine (TRANSDERM-SCOP) 1 MG/3DAYS 1.5 mg   promethazine (PHENERGAN) 25 mg in sodium chloride 0.9 % 50 mL IVPB   famotidine (PEPCID) IVPB 20 mg premix   cefTRIAXone (ROCEPHIN) 2 g in sodium chloride 0.9 % 100 mL IVPB    Order Specific Question:   Antibiotic Indication:    Answer:   UTI   scopolamine (TRANSDERM-SCOP) 1 MG/3DAYS  Sig: '1mg'$ /3 day, Apply new patch every 72 hours.    Dispense:  10 patch    Refill:  0    Order Specific Question:   Supervising Provider    Answer:   Griffin Basil [id number]   Imaging Orders  No imaging studies ordered today    MDM moderate  Assessment and Plan   1. Hyperemesis gravidarum  -Treated with IV fluids, phenergan, & pepcid in MAU. Able to keep down crackers & no vomiting. Scop patch applied. States she hadn't filled scop rx b/c of cost ($830 for month supply with insurance). Given goodrx coupon & repriscribed scopolamine -Given IV rocephin 2 gm since patient unable to keep down abx. U/a improved from earlier visit & asymptomatic. Afebrile & no CVA tenderness.   2. Hyperthyroidism affecting pregnancy in second trimester  -Labs stable. No signs of storm at this time. Reviewed with Dr. Harolyn Rutherford  3. [redacted] weeks gestation of pregnancy     #FWB: FHT 147 per doppler    Dispo: discharged to home in stable condition.   Discharge Instructions     Discharge patient   Complete by: As directed    Discharge disposition: 01-Home or Self Care   Discharge patient date: 09/27/2022       Jorje Guild, NP 09/27/22 9:42 PM  Allergies as of 09/27/2022   No Known Allergies      Medication List     STOP taking these medications    cefadroxil 500 MG capsule Commonly known as: DURICEF       TAKE these medications    aspirin EC 81 MG tablet Take 1 tablet (81 mg total) by mouth daily. Start taking when you are [redacted] weeks pregnant for rest of pregnancy for prevention of  preeclampsia   cyclobenzaprine 10 MG tablet Commonly known as: FLEXERIL Take 1 tablet (10 mg total) by mouth 2 (two) times daily as needed for muscle spasms.   folic acid A999333 MCG tablet Commonly known as: Folate Take 1 tablet (400 mcg total) by mouth daily.   metoCLOPramide 10 MG tablet Commonly known as: Reglan Take 1 tablet (10 mg total) by mouth every 6 (six) hours.   ondansetron 8 MG disintegrating tablet Commonly known as: ZOFRAN-ODT Take 1 tablet (8 mg total) by mouth every 8 (eight) hours as needed for nausea or vomiting.   polyethylene glycol 17 g packet Commonly known as: MIRALAX / GLYCOLAX Take 17 g by mouth daily.   propranolol 60 MG tablet Commonly known as: INDERAL Take by mouth.   propylthiouracil 50 MG tablet Commonly known as: PTU Take by mouth.   scopolamine 1 MG/3DAYS Commonly known as: TRANSDERM-SCOP '1mg'$ /3 day, Apply new patch every 72 hours.

## 2022-09-27 NOTE — MAU Note (Signed)
April Benson is a 27 y.o. at 71w1dhere in MAU reporting: N/V throughout the pregancy but it has gotten worse in the last week. Pt hastn been able to keep anything down since Monday. Pt states she had an infusion today at 1500 and received fluids and Phenergan. Pt states she usually feels better after the infusion but today she still wasn't able to eat anything after. Pt states she was diagnoses with a UTI but wasn't able to keep her abx down today. Pt states she isnt sure if she's been feeling baby move since she feels a lot of pressure in her abdomen. Pt denies LOF and VB.   Onset of complaint: 09/23/2022 Pain score: 3/10 back  3/10 abdomen  Vitals:   09/27/22 1758 09/27/22 1802  BP: 129/62 131/61  Pulse: (!) 109 (!) 107  Resp: 20   Temp: 98.1 F (36.7 C)   SpO2: 97%      FHT:147 Lab orders placed from triage:

## 2022-09-28 LAB — T3, FREE: T3, Free: 14.5 pg/mL — ABNORMAL HIGH (ref 2.0–4.4)

## 2022-09-30 ENCOUNTER — Ambulatory Visit (INDEPENDENT_AMBULATORY_CARE_PROVIDER_SITE_OTHER)

## 2022-09-30 VITALS — BP 114/74 | HR 91 | Temp 98.4°F | Resp 18 | Ht 63.0 in | Wt 184.4 lb

## 2022-09-30 DIAGNOSIS — O219 Vomiting of pregnancy, unspecified: Secondary | ICD-10-CM | POA: Diagnosis not present

## 2022-09-30 DIAGNOSIS — Z3A17 17 weeks gestation of pregnancy: Secondary | ICD-10-CM

## 2022-09-30 MED ORDER — SODIUM CHLORIDE 0.9 % IV SOLN
Freq: Once | INTRAVENOUS | Status: AC
  Filled 2022-09-30: qty 1000

## 2022-09-30 NOTE — Progress Notes (Signed)
Diagnosis: Iron Deficiency Anemia  Provider:  Marshell Garfinkel MD  Procedure: Infusion  IV Type: Peripheral, IV Location: R Forearm  NS with Phenergan, Dose: 1000 ml  Infusion Start Time: [id number]  Infusion Stop Time: 1606  Post Infusion IV Care: Peripheral IV Discontinued  Discharge: Condition: Good, Destination: Home . AVS Declined  Performed by:  Arnoldo Morale, RN

## 2022-10-03 ENCOUNTER — Ambulatory Visit (INDEPENDENT_AMBULATORY_CARE_PROVIDER_SITE_OTHER)

## 2022-10-03 VITALS — BP 122/82 | HR 109 | Temp 98.1°F | Resp 20 | Ht 63.0 in | Wt 187.6 lb

## 2022-10-03 DIAGNOSIS — O219 Vomiting of pregnancy, unspecified: Secondary | ICD-10-CM

## 2022-10-03 DIAGNOSIS — Z3A20 20 weeks gestation of pregnancy: Secondary | ICD-10-CM

## 2022-10-03 MED ORDER — SODIUM CHLORIDE 0.9 % IV SOLN
Freq: Once | INTRAVENOUS | Status: AC
  Filled 2022-10-03: qty 1000

## 2022-10-03 NOTE — Progress Notes (Signed)
Diagnosis: nausea and vomiting of pregnancy  Provider:  Marshell Garfinkel MD  Procedure: Infusion  IV Type: Peripheral, IV Location: R Forearm  Phenergan  with NS 1000 ml  , Dose: 12.5   Infusion Start Time: 1501  Infusion Stop Time: 1610  Post Infusion IV Care: Peripheral IV Discontinued  Discharge: Condition: Good, Destination: Home . AVS Declined  Performed by:  Fraser Din [place]king[place]Burchett, RN

## 2022-10-07 ENCOUNTER — Ambulatory Visit (INDEPENDENT_AMBULATORY_CARE_PROVIDER_SITE_OTHER): Admitting: Obstetrics & Gynecology

## 2022-10-07 VITALS — BP 137/76 | HR 108 | Wt 188.0 lb

## 2022-10-07 DIAGNOSIS — Z3480 Encounter for supervision of other normal pregnancy, unspecified trimester: Secondary | ICD-10-CM

## 2022-10-07 DIAGNOSIS — O219 Vomiting of pregnancy, unspecified: Secondary | ICD-10-CM

## 2022-10-07 DIAGNOSIS — Z3A18 18 weeks gestation of pregnancy: Secondary | ICD-10-CM | POA: Diagnosis not present

## 2022-10-07 DIAGNOSIS — E0590 Thyrotoxicosis, unspecified without thyrotoxic crisis or storm: Secondary | ICD-10-CM

## 2022-10-07 DIAGNOSIS — O99282 Endocrine, nutritional and metabolic diseases complicating pregnancy, second trimester: Secondary | ICD-10-CM

## 2022-10-07 DIAGNOSIS — O210 Mild hyperemesis gravidarum: Secondary | ICD-10-CM

## 2022-10-07 NOTE — Progress Notes (Signed)
Pt declined AFP 

## 2022-10-07 NOTE — Progress Notes (Signed)
Patient ID: TEMEIKA MOULDEN, female   DOB: 03/19/1996, 27 y.o.   MRN: [id number]   PRENATAL VISIT NOTE  Subjective:  LEKETA NEUJAHR is a 27 y.o. G2P1001 at [redacted]w[redacted]d being seen today for ongoing prenatal care.  She is currently monitored for the following issues for this high-risk pregnancy and has Nausea and vomiting of pregnancy, antepartum; Hyperthyroidism complicating pregnancy; History of gestational hypertension; Supervision of other normal pregnancy, antepartum; Rubella non-immune status, antepartum; E. coli UTI (urinary tract infection); and Thyrotoxicosis with thyrotoxic crisis on their problem list.  Patient reports nausea and vomiting.  Contractions: Not present. Vag. Bleeding: None.  Movement: Absent. Denies leaking of fluid.   The following portions of the patient's history were reviewed and updated as appropriate: allergies, current medications, past family history, past medical history, past social history, past surgical history and problem list.   Objective:   Vitals:   10/07/22 1044 10/07/22 1052  BP: (!) 144/83 137/76  Pulse: (!) 119 (!) 108  Weight: 85.3 kg     Fetal Status: Fetal Heart Rate (bpm): 141   Movement: Absent     General:  Alert, oriented and cooperative. Patient is in no acute distress.  Skin: Skin is warm and dry. No rash noted.   Cardiovascular: Normal heart rate noted  Respiratory: Normal respiratory effort, no problems with respiration noted  Abdomen: Soft, gravid, appropriate for gestational age.  Pain/Pressure: Absent     Pelvic: Cervical exam deferred        Extremities: Normal range of motion.  Edema: None  Mental Status: Normal mood and affect. Normal behavior. Normal judgment and thought content.   Assessment and Plan:  Pregnancy: G2P1001 at [redacted]w[redacted]d 1. Supervision of other normal pregnancy, antepartum - Culture, OB Urine  2.  N/V pregnancy Continue phenergan and 2x week iv fluids. Therapy plans look in place on Epic   3.  Hx Gest HTN -baby  ASA -BP elevated when she came in--walked straight from parking lot and took BP; second reading is normal; Home BPs and BPs at infusion center are normal.   4.  Hyperthyroid -better this week; sees endo on Thursday  Preterm labor symptoms and general obstetric precautions including but not limited to vaginal bleeding, contractions, leaking of fluid and fetal movement were reviewed in detail with the patient. Please refer to After Visit Summary for other counseling recommendations.   No follow-ups on file.  Future Appointments  Date Time Provider [place]  10/14/2022 10:15 AM [facility] NURSE [facility]  10/14/2022 10:30 AM [facility] US3 [facility]    Silas Sacramento, MD

## 2022-10-09 LAB — CULTURE, OB URINE

## 2022-10-09 LAB — URINE CULTURE, OB REFLEX

## 2022-10-14 ENCOUNTER — Ambulatory Visit: Admitting: *Deleted

## 2022-10-14 ENCOUNTER — Other Ambulatory Visit: Payer: Self-pay | Admitting: *Deleted

## 2022-10-14 ENCOUNTER — Ambulatory Visit: Attending: Obstetrics and Gynecology

## 2022-10-14 ENCOUNTER — Encounter: Payer: Self-pay | Admitting: *Deleted

## 2022-10-14 VITALS — BP 127/66 | HR 93

## 2022-10-14 DIAGNOSIS — Z3A10 10 weeks gestation of pregnancy: Secondary | ICD-10-CM | POA: Diagnosis present

## 2022-10-14 DIAGNOSIS — Z3480 Encounter for supervision of other normal pregnancy, unspecified trimester: Secondary | ICD-10-CM | POA: Diagnosis present

## 2022-10-14 DIAGNOSIS — Z3A19 19 weeks gestation of pregnancy: Secondary | ICD-10-CM | POA: Diagnosis not present

## 2022-10-14 DIAGNOSIS — E0590 Thyrotoxicosis, unspecified without thyrotoxic crisis or storm: Secondary | ICD-10-CM

## 2022-10-14 DIAGNOSIS — Z362 Encounter for other antenatal screening follow-up: Secondary | ICD-10-CM

## 2022-10-14 DIAGNOSIS — O99212 Obesity complicating pregnancy, second trimester: Secondary | ICD-10-CM | POA: Diagnosis not present

## 2022-10-14 DIAGNOSIS — Z3689 Encounter for other specified antenatal screening: Secondary | ICD-10-CM

## 2022-10-14 DIAGNOSIS — O10912 Unspecified pre-existing hypertension complicating pregnancy, second trimester: Secondary | ICD-10-CM | POA: Insufficient documentation

## 2022-10-14 DIAGNOSIS — O09292 Supervision of pregnancy with other poor reproductive or obstetric history, second trimester: Secondary | ICD-10-CM | POA: Diagnosis not present

## 2022-10-14 DIAGNOSIS — E039 Hypothyroidism, unspecified: Secondary | ICD-10-CM | POA: Insufficient documentation

## 2022-10-14 DIAGNOSIS — O112 Pre-existing hypertension with pre-eclampsia, second trimester: Secondary | ICD-10-CM | POA: Insufficient documentation

## 2022-10-14 DIAGNOSIS — O99282 Endocrine, nutritional and metabolic diseases complicating pregnancy, second trimester: Secondary | ICD-10-CM | POA: Insufficient documentation

## 2022-10-14 DIAGNOSIS — O10919 Unspecified pre-existing hypertension complicating pregnancy, unspecified trimester: Secondary | ICD-10-CM

## 2022-10-14 DIAGNOSIS — Z363 Encounter for antenatal screening for malformations: Secondary | ICD-10-CM | POA: Diagnosis not present

## 2022-10-28 ENCOUNTER — Encounter: Admitting: Obstetrics & Gynecology

## 2022-10-31 ENCOUNTER — Encounter: Payer: Self-pay | Admitting: Obstetrics & Gynecology

## 2022-10-31 ENCOUNTER — Ambulatory Visit (INDEPENDENT_AMBULATORY_CARE_PROVIDER_SITE_OTHER): Admitting: Obstetrics & Gynecology

## 2022-10-31 VITALS — BP 123/77 | HR 115 | Wt 191.0 lb

## 2022-10-31 DIAGNOSIS — O99282 Endocrine, nutritional and metabolic diseases complicating pregnancy, second trimester: Secondary | ICD-10-CM

## 2022-10-31 DIAGNOSIS — O0992 Supervision of high risk pregnancy, unspecified, second trimester: Secondary | ICD-10-CM

## 2022-10-31 DIAGNOSIS — E0590 Thyrotoxicosis, unspecified without thyrotoxic crisis or storm: Secondary | ICD-10-CM

## 2022-10-31 NOTE — Progress Notes (Addendum)
HIGH-RISK PREGNANCY VISIT Patient name: April Benson MRN [id number]  Date of birth: October 11, 1995 Chief Complaint:   Routine Prenatal Visit  History of Present Illness:   April Benson is a 27 y.o. G49P1001 female at [redacted]w[redacted]d with an Estimated Date of Delivery: 03/06/23 being seen today for ongoing management of a high-risk pregnancy complicated by:  -Hyperthyroid with thyroid storm in pregnancy Being followed at [facility] Not responding to PTU, plan to switch Methimazole once labs return []  pt awaiting phone call from IM  Today she reports  headaches, shortness of breath and heart palpitations.  These have been her baseline symptoms while trying to sort out her hyperthyroidism  Contractions: Not present. Vag. Bleeding: None.  Movement: Present. denies leaking of fluid.      08/12/2022   10:49 AM  Depression screen PHQ 2/9  Decreased Interest 2  Down, Depressed, Hopeless 0  PHQ - 2 Score 2  Altered sleeping 0  Tired, decreased energy 3  Change in appetite 3  Feeling bad or failure about yourself  0  Trouble concentrating 0  Moving slowly or fidgety/restless 0  Suicidal thoughts 0  PHQ-9 Score 8     Current Outpatient Medications  Medication Instructions   aspirin EC 81 mg, Oral, Daily, Start taking when you are [redacted] weeks pregnant for rest of pregnancy for prevention of preeclampsia   cyclobenzaprine (FLEXERIL) 10 mg, Oral, 2 times daily PRN   folic acid (FOLATE) 400 mcg, Oral, Daily   metoCLOPramide (REGLAN) 10 mg, Oral, Every 6 hours   ondansetron (ZOFRAN-ODT) 8 mg, Oral, Every 8 hours PRN   polyethylene glycol (MIRALAX / GLYCOLAX) 17 g, Oral, Daily   Prenatal Vit-Fe Fumarate-FA (PRENATAL MULTIVITAMIN) TABS tablet 1 tablet, Oral, Daily   propranolol (INDERAL) 60 MG tablet Oral   propylthiouracil (PTU) 50 MG tablet Oral   scopolamine (TRANSDERM-SCOP) 1 MG/3DAYS 1mg /3 day, Apply new patch every 72 hours.     Review of Systems:   Pertinent items are noted in HPI Denies abnormal  vaginal discharge w/ itching/odor/irritation, headaches, visual changes, shortness of breath, chest pain, abdominal pain, severe nausea/vomiting, or problems with urination or bowel movements unless otherwise stated above. Pertinent History Reviewed:  Reviewed past medical,surgical, social, obstetrical and family history.  Reviewed problem list, medications and allergies. Physical Assessment:   Vitals:   10/31/22 1311  BP: 123/77  Pulse: (!) 115  Weight: 191 lb (86.6 kg)  Body mass index is 33.83 kg/m.           Physical Examination:   General appearance: alert, well appearing, and in no distress  Mental status: normal mood, behavior, speech, dress, motor activity, and thought processes  Skin: warm & dry   Extremities: Edema: None    Cardiovascular: normal heart rate noted  Respiratory: normal respiratory effort, no distress  Abdomen: gravid, soft, non-tender  Pelvic: Cervical exam deferred         Fetal Status: Fetal Heart Rate (bpm): 140 Fundal Height: 19 cm Movement: Present    Fetal Surveillance Testing today: doppler   Chaperone: N/A    No results found for this or any previous visit (from the past 24 hour(s)).   Assessment & Plan:  High-risk pregnancy: G2P1001 at [redacted]w[redacted]d with an Estimated Date of Delivery: 03/06/23   1) hyperthyroidism with thyroid storm in pregnancy -Plans to start on methimazole per IM recommendations -Referral created to cardiology for echo  2) Possible FGR 20-week scan- 7 percentile patient has follow-up scheduled in 4 weeks  3)  Contraceptive management -desires BTL -plan to complete paperwork around 27-28 weeks  Meds: No orders of the defined types were placed in this encounter.   Labs/procedures today: doppler  Treatment Plan:  routine OB care, likely growth q [redacted]wks along with antepartum testing  Reviewed: Preterm labor symptoms and general obstetric precautions including but not limited to vaginal bleeding, contractions, leaking of fluid  and fetal movement were reviewed in detail with the patient.  All questions were answered. Pt has home bp cuff. Check bp weekly, let us know if >140/90.   Follow-up: Return in about 4 weeks (around 11/28/2022) for HROB visit- MD only and glucola.   Future Appointments  Date Time Provider Department Center  11/11/2022 12:30 PM [facility] NURSE [facility]  11/11/2022 12:45 PM [facility] US5 [facility]  12/05/2022  8:10 AM Anyanwu, Jethro Bastos, MD [facility]    Orders Placed This Encounter  Procedures   AMB Referral to Cardio Obstetrics    Myna Hidalgo, DO Attending Obstetrician & Gynecologist, Faculty Practice Center for [facility], [facility]

## 2022-11-06 ENCOUNTER — Encounter: Payer: Self-pay | Admitting: *Deleted

## 2022-11-11 ENCOUNTER — Other Ambulatory Visit: Payer: Self-pay | Admitting: *Deleted

## 2022-11-11 ENCOUNTER — Ambulatory Visit: Admitting: *Deleted

## 2022-11-11 ENCOUNTER — Ambulatory Visit: Attending: Obstetrics and Gynecology

## 2022-11-11 VITALS — BP 133/65 | HR 114

## 2022-11-11 DIAGNOSIS — Z3A23 23 weeks gestation of pregnancy: Secondary | ICD-10-CM

## 2022-11-11 DIAGNOSIS — O99282 Endocrine, nutritional and metabolic diseases complicating pregnancy, second trimester: Secondary | ICD-10-CM | POA: Diagnosis not present

## 2022-11-11 DIAGNOSIS — E0590 Thyrotoxicosis, unspecified without thyrotoxic crisis or storm: Secondary | ICD-10-CM | POA: Diagnosis not present

## 2022-11-11 DIAGNOSIS — E669 Obesity, unspecified: Secondary | ICD-10-CM

## 2022-11-11 DIAGNOSIS — Z362 Encounter for other antenatal screening follow-up: Secondary | ICD-10-CM | POA: Insufficient documentation

## 2022-11-11 DIAGNOSIS — O99212 Obesity complicating pregnancy, second trimester: Secondary | ICD-10-CM | POA: Diagnosis not present

## 2022-11-11 DIAGNOSIS — O10919 Unspecified pre-existing hypertension complicating pregnancy, unspecified trimester: Secondary | ICD-10-CM | POA: Insufficient documentation

## 2022-11-11 DIAGNOSIS — O99213 Obesity complicating pregnancy, third trimester: Secondary | ICD-10-CM

## 2022-11-11 DIAGNOSIS — O99280 Endocrine, nutritional and metabolic diseases complicating pregnancy, unspecified trimester: Secondary | ICD-10-CM | POA: Diagnosis present

## 2022-11-11 DIAGNOSIS — O99283 Endocrine, nutritional and metabolic diseases complicating pregnancy, third trimester: Secondary | ICD-10-CM

## 2022-11-11 DIAGNOSIS — O09292 Supervision of pregnancy with other poor reproductive or obstetric history, second trimester: Secondary | ICD-10-CM | POA: Diagnosis not present

## 2022-11-21 ENCOUNTER — Encounter (HOSPITAL_COMMUNITY): Payer: Self-pay

## 2022-11-21 ENCOUNTER — Other Ambulatory Visit: Payer: Self-pay | Admitting: Cardiology

## 2022-11-21 DIAGNOSIS — R002 Palpitations: Secondary | ICD-10-CM

## 2022-11-27 ENCOUNTER — Ambulatory Visit (HOSPITAL_COMMUNITY): Attending: Cardiology

## 2022-11-27 DIAGNOSIS — R002 Palpitations: Secondary | ICD-10-CM | POA: Diagnosis not present

## 2022-11-27 LAB — ECHOCARDIOGRAM COMPLETE
Area-P 1/2: 4.93 cm2
S' Lateral: 3.3 cm

## 2022-12-05 ENCOUNTER — Ambulatory Visit (INDEPENDENT_AMBULATORY_CARE_PROVIDER_SITE_OTHER): Admitting: Obstetrics & Gynecology

## 2022-12-05 VITALS — BP 111/70 | HR 89 | Wt 199.0 lb

## 2022-12-05 DIAGNOSIS — E0590 Thyrotoxicosis, unspecified without thyrotoxic crisis or storm: Secondary | ICD-10-CM

## 2022-12-05 DIAGNOSIS — O99282 Endocrine, nutritional and metabolic diseases complicating pregnancy, second trimester: Secondary | ICD-10-CM | POA: Diagnosis not present

## 2022-12-05 DIAGNOSIS — O99012 Anemia complicating pregnancy, second trimester: Secondary | ICD-10-CM

## 2022-12-05 DIAGNOSIS — Z23 Encounter for immunization: Secondary | ICD-10-CM | POA: Diagnosis not present

## 2022-12-05 DIAGNOSIS — O99013 Anemia complicating pregnancy, third trimester: Secondary | ICD-10-CM

## 2022-12-05 DIAGNOSIS — Z3A27 27 weeks gestation of pregnancy: Secondary | ICD-10-CM | POA: Diagnosis not present

## 2022-12-05 DIAGNOSIS — Z8759 Personal history of other complications of pregnancy, childbirth and the puerperium: Secondary | ICD-10-CM

## 2022-12-05 DIAGNOSIS — Z3009 Encounter for other general counseling and advice on contraception: Secondary | ICD-10-CM

## 2022-12-05 DIAGNOSIS — O0992 Supervision of high risk pregnancy, unspecified, second trimester: Secondary | ICD-10-CM

## 2022-12-05 NOTE — Progress Notes (Signed)
PRENATAL VISIT NOTE  Subjective:  April Benson is a 27 y.o. G2P1001 at [redacted]w[redacted]d being seen today for ongoing prenatal care.  She is currently monitored for the following issues for this high-risk pregnancy and has Nausea and vomiting of pregnancy, antepartum; Hyperthyroidism complicating pregnancy; History of gestational hypertension; Supervision of high-risk pregnancy; Rubella non-immune status, antepartum; and Thyrotoxicosis with thyrotoxic crisis on their problem list.  Patient reports no complaints.  Contractions: Not present. Vag. Bleeding: None.  Movement: Present. Denies leaking of fluid.   The following portions of the patient's history were reviewed and updated as appropriate: allergies, current medications, past family history, past medical history, past social history, past surgical history and problem list.   Objective:   Vitals:   12/05/22 0803  BP: 111/70  Pulse: 89  Weight: 199 lb (90.3 kg)    Fetal Status: Fetal Heart Rate (bpm): 136   Movement: Present     General:  Alert, oriented and cooperative. Patient is in no acute distress.  Skin: Skin is warm and dry. No rash noted.   Cardiovascular: Normal heart rate noted  Respiratory: Normal respiratory effort, no problems with respiration noted  Abdomen: Soft, gravid, appropriate for gestational age.  Pain/Pressure: Absent     Pelvic: Cervical exam deferred        Extremities: Normal range of motion.  Edema: None  Mental Status: Normal mood and affect. Normal behavior. Normal judgment and thought content.   Assessment and Plan:  Pregnancy: G2P1001 at [redacted]w[redacted]d 1. Hyperthyroidism affecting pregnancy in second trimester Followed closely by Endocrinologist.  On Methimazole 10 mg and Propanolol 60 mg daily.  Already getting scans and scheduled for antenatal testing by MFM.  Already had normal ECHO, scheduled to see Cardiologist on 12/06/22.  2. History of gestational hypertension Normotensive.  3. Consultation for  sterilization Patient desires permanent sterilization.  Other reversible forms of contraception including the most effective LARCs such as IUD or Nexplanon were discussed with patient; she declines all other modalities. Her FOB is also considering vasectomy, discussed that this procedure is less invasive and can be more effective.  Details of postpartum tubal sterilization discussed in detail.   She was told that this will be performed as either salpingectomy or occlusion with Filshie clips, depending on difficulty of procedure, exposure and other factors.  Risks of procedure discussed with patient including but not limited to: risk of regret, permanence of method, bleeding, infection, injury to surrounding organs and need for additional procedures.  Failure risk of about 1-2% with increased risk of ectopic gestation if pregnancy occurs was also discussed with patient.   Also discussed possibility of post-tubal syndrome with increased pelvic pain or menstrual irregularities. Patient verbalized understanding of these risks and wants to proceed with sterilization. Medicaid papers had been signed on 12/05/2022.   4. [redacted] weeks gestation of pregnancy 5. Supervision of high risk pregnancy in second trimester Labs and Tdap done today, will follow up results and manage accordingly. - Glucose Tolerance, 2 Hours w/1 Hour - HIV antibody (with reflex) - CBC - RPR - Tdap vaccine greater than or equal to 7yo IM Preterm labor symptoms and general obstetric precautions including but not limited to vaginal bleeding, contractions, leaking of fluid and fetal movement were reviewed in detail with the patient. Please refer to After Visit Summary for other counseling recommendations.   Return in about 3 weeks (around 12/26/2022) for OFFICE OB VISIT (MD only).  Future Appointments  Date Time Provider Department Center  12/06/2022  2:20 PM Tobb, Kardie, DO [facility] None  12/18/2022  3:30 PM [facility] NURSE [facility]  12/18/2022   3:45 PM [facility] US5 [facility]  12/26/2022  2:50 PM Jojo Pehl, Jethro Bastos, MD [facility]  01/13/2023  1:50 PM Lennart Pall, MD [facility]  01/13/2023  3:30 PM [facility] NURSE [facility]  01/13/2023  3:45 PM [facility] US4 [facility]  01/22/2023  3:00 PM [facility] NURSE [facility]  01/22/2023  3:15 PM [facility] NST [facility]  01/29/2023  3:00 PM [facility] NURSE [facility]  01/29/2023  3:15 PM [facility] NST [facility]  02/05/2023  3:00 PM [facility] NURSE [facility]  02/05/2023  3:15 PM [facility] NST [facility]  02/10/2023  3:30 PM [facility] NURSE [facility]  02/10/2023  3:45 PM [facility] US4 [facility]  02/17/2023  3:00 PM [facility] NURSE [facility]  02/17/2023  3:15 PM [facility] NST [facility]  02/24/2023  3:00 PM [facility] NURSE [facility]  02/24/2023  3:15 PM [facility] NST [facility]    Jaynie Collins, MD

## 2022-12-06 ENCOUNTER — Ambulatory Visit (INDEPENDENT_AMBULATORY_CARE_PROVIDER_SITE_OTHER): Admitting: Cardiology

## 2022-12-06 ENCOUNTER — Encounter: Payer: Self-pay | Admitting: Cardiology

## 2022-12-06 ENCOUNTER — Other Ambulatory Visit (INDEPENDENT_AMBULATORY_CARE_PROVIDER_SITE_OTHER)

## 2022-12-06 VITALS — BP 130/77 | HR 101 | Ht 63.0 in | Wt 200.0 lb

## 2022-12-06 DIAGNOSIS — O99013 Anemia complicating pregnancy, third trimester: Secondary | ICD-10-CM | POA: Insufficient documentation

## 2022-12-06 DIAGNOSIS — R002 Palpitations: Secondary | ICD-10-CM

## 2022-12-06 DIAGNOSIS — Z3A27 27 weeks gestation of pregnancy: Secondary | ICD-10-CM | POA: Diagnosis not present

## 2022-12-06 DIAGNOSIS — R0602 Shortness of breath: Secondary | ICD-10-CM

## 2022-12-06 LAB — CBC
Hematocrit: 32.8 % — ABNORMAL LOW (ref 34.0–46.6)
Hemoglobin: 10.4 g/dL — ABNORMAL LOW (ref 11.1–15.9)
MCH: 26.3 pg — ABNORMAL LOW (ref 26.6–33.0)
MCHC: 31.7 g/dL (ref 31.5–35.7)
MCV: 83 fL (ref 79–97)
Platelets: 243 10*3/uL (ref 150–450)
RBC: 3.96 x10E6/uL (ref 3.77–5.28)
RDW: 13.6 % (ref 11.7–15.4)
WBC: 6.9 10*3/uL (ref 3.4–10.8)

## 2022-12-06 LAB — RPR: RPR Ser Ql: NONREACTIVE

## 2022-12-06 LAB — GLUCOSE TOLERANCE, 2 HOURS W/ 1HR
Glucose, 1 hour: 154 mg/dL (ref 70–179)
Glucose, 2 hour: 142 mg/dL (ref 70–152)
Glucose, Fasting: 82 mg/dL (ref 70–91)

## 2022-12-06 LAB — HIV ANTIBODY (ROUTINE TESTING W REFLEX): HIV Screen 4th Generation wRfx: NONREACTIVE

## 2022-12-06 MED ORDER — FERRIC MALTOL 30 MG PO CAPS
1.0000 | ORAL_CAPSULE | Freq: Two times a day (BID) | ORAL | 2 refills | Status: DC
Start: 2022-12-06 — End: 2023-02-16

## 2022-12-06 MED ORDER — PROPRANOLOL HCL 60 MG PO TABS: 60.0000 mg | ORAL_TABLET | Freq: Three times a day (TID) | ORAL | 3 refills | Status: AC

## 2022-12-06 NOTE — Patient Instructions (Signed)
Medication Instructions:  Your physician has recommended you make the following change in your medication:  START: Propanolol 60 mg every 8 hours *If you need a refill on your cardiac medications before your next appointment, please call your pharmacy*   Lab Work: None   Testing/Procedures: Christena Deem- Long Term Monitor Instructions  Billing and Patient Assistance Program Information  We have supplied [employer] with any of your insurance information on file for billing purposes. [employer] offers a sliding scale Patient Assistance Program for patients that do not have  insurance, or whose insurance does not completely cover the cost of the ZIO monitor.  You must apply for the Patient Assistance Program to qualify for this discounted rate.  To apply, please call [employer] at [phone], select option 4, select option 2, ask to apply for  Patient Assistance Program. Meredeth Ide will ask your household income, and how many people  are in your household. They will quote your out-of-pocket cost based on that information.  [employer] will also be able to set up a 67-month, interest-free payment plan if needed.  Removing patch  When you are ready to remove the patch, follow instructions on the last 2 pages of Patient  Logbook. Stick patch monitor onto the last page of Patient Logbook.  Place Patient Logbook in the blue and white box. Use locking tab on box and tape box closed  securely. The blue and white box has prepaid postage on it. Please place it in the mailbox as  soon as possible. Your physician should have your test results approximately 7 days after the  monitor has been mailed back to [facility].  Call [facility] Customer Care at [phone] if you have questions regarding  your ZIO XT patch monitor. Call them immediately if you see an orange light blinking on your  monitor.  If your monitor falls off in less than 4 days, contact our Monitor department at [phone].  If your  monitor becomes loose or falls off after 4 days call [employer] at [phone] for  suggestions on securing your monitor    Follow-Up: At [facility], you and your health needs are our priority.  As part of our continuing mission to provide you with exceptional heart care, we have created designated Provider Care Teams.  These Care Teams include your primary Cardiologist (physician) and Advanced Practice Providers (APPs -  Physician Assistants and Nurse Practitioners) who all work together to provide you with the care you need, when you need it.   Your next appointment:   8 week(s)  Provider:   Thomasene Ripple, DO

## 2022-12-06 NOTE — Addendum Note (Signed)
Addended by: Jaynie Collins A on: 12/06/2022 12:01 PM   Modules accepted: Orders

## 2022-12-06 NOTE — Progress Notes (Signed)
Cardio[facility]stetrics Clinic  New Evaluation  Date:  12/06/2022   ID:  ARACELLI KOZAN, DOB Jul 29, 1995, MRN [id number]  PCP:  Abner Greenspan, MD   [facility] Providers Cardiologist:  Thomasene Ripple, DO  Electrophysiologist:  None       Referring MD: Abner Greenspan, MD   Chief Complaint: " I am ok"  History of Present Illness:    April Benson is a 27 y.o. female [G2P1001] who is being seen today for the evaluation of shortness of breath and palpitations at the request of Abner Greenspan, MD.   Medical hx of hypertension, Hyperthyroidism, here today shortness of breath and palpitations. Prior to this visit she had her echo which normal EF with no significant valve abnormalities.   She reports worsening palpitation currently on Propanolol which is now 60mg  BID. No other complaints at this time.  Currently 27 weeks and 1 day pregnant  Prior CV Studies Reviewed: The following studies were reviewed today:    Past Medical History:  Diagnosis Date   E. coli UTI (urinary tract infection) 09/25/2022   TOC [ ]     Hypertension    Hyperthyroidism    Tachycardia     Past Surgical History:  Procedure Laterality Date   NO PAST SURGERIES        OB History     Gravida  2   Para  1   Term  1   Preterm      AB      Living  1      SAB      IAB      Ectopic      Multiple  0   Live Births  1               Current Medications: Current Meds  Medication Sig   aspirin EC 81 MG tablet Take 1 tablet (81 mg total) by mouth daily. Start taking when you are [redacted] weeks pregnant for rest of pregnancy for prevention of preeclampsia   cyclobenzaprine (FLEXERIL) 10 MG tablet Take 1 tablet (10 mg total) by mouth 2 (two) times daily as needed for muscle spasms.   Ferric Maltol 30 MG CAPS Take 1 capsule (30 mg total) by mouth 2 (two) times daily. Please take one hour before breakfast and dinner   folic acid (FOLATE) 400 MCG tablet Take 1 tablet (400 mcg total) by mouth daily.    methimazole (TAPAZOLE) 10 MG tablet Take 10 mg by mouth 3 (three) times daily. Taking 40 mg tid   metoCLOPramide (REGLAN) 10 MG tablet Take 1 tablet (10 mg total) by mouth every 6 (six) hours.   ondansetron (ZOFRAN-ODT) 8 MG disintegrating tablet Take 1 tablet (8 mg total) by mouth every 8 (eight) hours as needed for nausea or vomiting.   polyethylene glycol (MIRALAX / GLYCOLAX) 17 g packet Take 17 g by mouth daily.   Prenatal Vit-Fe Fumarate-FA (PRENATAL MULTIVITAMIN) TABS tablet Take 1 tablet by mouth daily at 12 noon.   propranolol (INDERAL) 60 MG tablet Take 1 tablet (60 mg total) by mouth 3 (three) times daily.   [DISCONTINUED] propranolol (INDERAL) 60 MG tablet Take by mouth.   Current Facility-Administered Medications for the 12/06/22 encounter (Office Visit) with Thomasene Ripple, DO  Medication   promethazine (PHENERGAN) injection 12.5 mg     Allergies:   Patient has no known allergies.   Social History   Socioeconomic History   Marital status: Married    Spouse name: Not on  file   Number of children: Not on file   Years of education: Not on file   Highest education level: Not on file  Occupational History   Not on file  Tobacco Use   Smoking status: Never   Smokeless tobacco: Never  Vaping Use   Vaping Use: Never used  Substance and Sexual Activity   Alcohol use: Not Currently   Drug use: Never   Sexual activity: Not Currently    Partners: Male  Other Topics Concern   Not on file  Social History Narrative   ** Merged History Encounter **       Social Determinants of Health   Financial Resource Strain: Not on file  Food Insecurity: Not on file  Transportation Needs: Not on file  Physical Activity: Not on file  Stress: Not on file  Social Connections: Not on file      Family History  Problem Relation Age of Onset   Hypertension Mother    Diabetes Mother    Hypothyroidism Mother    Luiz Blare' disease Mother       ROS:   Please see the history of present  illness.    Palpitations All other systems reviewed and are negative.   Labs/EKG Reviewed:    EKG:   EKG is was not ordered today.     Recent Labs: 09/27/2022: ALT 17; BUN <5; Creatinine, Ser 0.45; Potassium 3.4; Sodium 136; TSH <0.010 12/05/2022: Hemoglobin 10.4; Platelets 243   Recent Lipid Panel No results found for: "CHOL", "TRIG", "HDL", "CHOLHDL", "LDLCALC", "LDLDIRECT"  Physical Exam:    VS:  BP 130/77   Pulse (!) 101   Ht 5\' 3"  (1.6 m)   Wt 200 lb (90.7 kg)   LMP 05/30/2022 (Exact Date)   SpO2 98%   BMI 35.43 kg/m     Wt Readings from Last 3 Encounters:  12/06/22 200 lb (90.7 kg)  12/05/22 199 lb (90.3 kg)  10/31/22 191 lb (86.6 kg)     GEN:  Well nourished, well developed in no acute distress HEENT: Normal NECK: No JVD; No carotid bruits LYMPHATICS: No lymphadenopathy CARDIAC: RRR, no murmurs, rubs, gallops RESPIRATORY:  Clear to auscultation without rales, wheezing or rhonchi  ABDOMEN: Soft, non-tender, non-distended MUSCULOSKELETAL:  No edema; No deformity  SKIN: Warm and dry NEUROLOGIC:  Alert and oriented x 3 PSYCHIATRIC:  Normal affect    Risk Assessment/Risk Calculators:     CARPREG II Risk Prediction Index Score:  1.  The patient's risk for a primary cardiac event is 5%.            ASSESSMENT & PLAN:    Palpitations - with hx of hyperthyroidism will place a zio on the patient to make sure that she is not going of sinus rhythm. Plan to increase her propanolol to 60 mg TID.   Shortness of breath - echo normal. Suspect this may be due to her current pregnant state. Will continue to monitor.     Patient Instructions  Medication Instructions:  Your physician has recommended you make the following change in your medication:  START: Propanolol 60 mg every 8 hours *If you need a refill on your cardiac medications before your next appointment, please call your pharmacy*   Lab Work: None   Testing/Procedures: Christena Deem- Long Term Monitor  Instructions  Billing and Patient Assistance Program Information  We have supplied [employer] with any of your insurance information on file for billing purposes. [employer] offers a sliding scale Patient Assistance Program  for patients that do not have  insurance, or whose insurance does not completely cover the cost of the ZIO monitor.  You must apply for the Patient Assistance Program to qualify for this discounted rate.  To apply, please call [employer] at [phone], select option 4, select option 2, ask to apply for  Patient Assistance Program. Meredeth Ide will ask your household income, and how many people  are in your household. They will quote your out-of-pocket cost based on that information.  [employer] will also be able to set up a 1-month, interest-free payment plan if needed.  Removing patch  When you are ready to remove the patch, follow instructions on the last 2 pages of Patient  Logbook. Stick patch monitor onto the last page of Patient Logbook.  Place Patient Logbook in the blue and white box. Use locking tab on box and tape box closed  securely. The blue and white box has prepaid postage on it. Please place it in the mailbox as  soon as possible. Your physician should have your test results approximately 7 days after the  monitor has been mailed back to [facility].  Call [facility] Customer Care at [phone] if you have questions regarding  your ZIO XT patch monitor. Call them immediately if you see an orange light blinking on your  monitor.  If your monitor falls off in less than 4 days, contact our Monitor department at [phone].  If your monitor becomes loose or falls off after 4 days call [employer] at [phone] for  suggestions on securing your monitor    Follow-Up: At [facility], you and your health needs are our priority.  As part of our continuing mission to provide you with exceptional heart care, we have created designated Provider Care  Teams.  These Care Teams include your primary Cardiologist (physician) and Advanced Practice Providers (APPs -  Physician Assistants and Nurse Practitioners) who all work together to provide you with the care you need, when you need it.   Your next appointment:   8 week(s)  Provider:   Thomasene Ripple, DO    Dispo:  No follow-ups on file.   Medication Adjustments/Labs and Tests Ordered: Current medicines are reviewed at length with the patient today.  Concerns regarding medicines are outlined above.  Tests Ordered: Orders Placed This Encounter  Procedures   LONG TERM MONITOR (3-14 DAYS)   Medication Changes: Meds ordered this encounter  Medications   propranolol (INDERAL) 60 MG tablet    Sig: Take 1 tablet (60 mg total) by mouth 3 (three) times daily.    Dispense:  270 tablet    Refill:  3

## 2022-12-18 ENCOUNTER — Ambulatory Visit: Attending: Maternal & Fetal Medicine

## 2022-12-18 ENCOUNTER — Ambulatory Visit: Admitting: *Deleted

## 2022-12-18 VITALS — BP 106/56 | HR 82

## 2022-12-18 DIAGNOSIS — E0590 Thyrotoxicosis, unspecified without thyrotoxic crisis or storm: Secondary | ICD-10-CM | POA: Insufficient documentation

## 2022-12-18 DIAGNOSIS — O10013 Pre-existing essential hypertension complicating pregnancy, third trimester: Secondary | ICD-10-CM | POA: Diagnosis not present

## 2022-12-18 DIAGNOSIS — O09293 Supervision of pregnancy with other poor reproductive or obstetric history, third trimester: Secondary | ICD-10-CM

## 2022-12-18 DIAGNOSIS — O99213 Obesity complicating pregnancy, third trimester: Secondary | ICD-10-CM | POA: Diagnosis present

## 2022-12-18 DIAGNOSIS — O99283 Endocrine, nutritional and metabolic diseases complicating pregnancy, third trimester: Secondary | ICD-10-CM | POA: Diagnosis present

## 2022-12-18 DIAGNOSIS — Z3A28 28 weeks gestation of pregnancy: Secondary | ICD-10-CM

## 2022-12-18 DIAGNOSIS — O0993 Supervision of high risk pregnancy, unspecified, third trimester: Secondary | ICD-10-CM | POA: Insufficient documentation

## 2022-12-18 DIAGNOSIS — E669 Obesity, unspecified: Secondary | ICD-10-CM

## 2022-12-26 ENCOUNTER — Ambulatory Visit (INDEPENDENT_AMBULATORY_CARE_PROVIDER_SITE_OTHER): Admitting: Obstetrics & Gynecology

## 2022-12-26 VITALS — BP 117/71 | HR 86 | Wt 203.0 lb

## 2022-12-26 DIAGNOSIS — O99282 Endocrine, nutritional and metabolic diseases complicating pregnancy, second trimester: Secondary | ICD-10-CM

## 2022-12-26 DIAGNOSIS — R519 Headache, unspecified: Secondary | ICD-10-CM

## 2022-12-26 DIAGNOSIS — Z8759 Personal history of other complications of pregnancy, childbirth and the puerperium: Secondary | ICD-10-CM

## 2022-12-26 DIAGNOSIS — E0590 Thyrotoxicosis, unspecified without thyrotoxic crisis or storm: Secondary | ICD-10-CM

## 2022-12-26 DIAGNOSIS — O99013 Anemia complicating pregnancy, third trimester: Secondary | ICD-10-CM

## 2022-12-26 DIAGNOSIS — Z3A30 30 weeks gestation of pregnancy: Secondary | ICD-10-CM

## 2022-12-26 DIAGNOSIS — O0993 Supervision of high risk pregnancy, unspecified, third trimester: Secondary | ICD-10-CM

## 2022-12-26 DIAGNOSIS — O26893 Other specified pregnancy related conditions, third trimester: Secondary | ICD-10-CM

## 2022-12-26 MED ORDER — METHIMAZOLE 10 MG PO TABS: 50.0000 mg | ORAL_TABLET | Freq: Three times a day (TID) | ORAL | 0 refills | Status: AC

## 2022-12-26 MED ORDER — MAGNESIUM OXIDE 400 MG PO TABS
400.0000 mg | ORAL_TABLET | Freq: Two times a day (BID) | ORAL | 2 refills | Status: DC | PRN
Start: 2022-12-26 — End: 2023-02-16

## 2022-12-26 MED ORDER — CAFFEINE 200 MG PO TABS: 200.0000 mg | ORAL_TABLET | Freq: Four times a day (QID) | ORAL | 2 refills | Status: DC | PRN

## 2022-12-26 NOTE — Patient Instructions (Addendum)
Return to office for any scheduled appointments. Call the office or go to the MAU at [facility] [place] if: You begin to have strong, frequent contractions Your water breaks.  Sometimes it is a big gush of fluid, sometimes it is just a trickle that keeps getting your underwear wet or running down your legs You have vaginal bleeding.  It is normal to have a small amount of spotting if your cervix was checked.  You do not feel your baby moving like normal.  If you do not, get something to eat and drink and lay down and focus on feeling your baby move.   If your baby is still not moving like normal, you should call the office or go to MAU. Any other obstetric concerns.   Safe Medications in Pregnancy  that are over the counter  Acne:  Benzoyl Peroxide  Salicylic Acid  *NO-Retin A  Backache/Headache:  Tylenol: 2 Regular strength every 4 hours OR               2 Extra strength every 6 hours   Colds/Coughs/Allergies: Allegra Benadryl (alcohol free) 25 mg every 6 hours as needed  Breath right strips  Claritin  Cepacol throat lozenges  Chloraseptic throat spray  Cold-Eeze- up to three times per day  Cough drops, alcohol free  Flonase Guaifenesin  Mucinex (plain/DM) Nasacort Robitussin DM (plain only, alcohol free)  Saline nasal spray/drops Steroid nasal sprays Sudafed (Pseudoephedrine) & Actifed * use only after [redacted] weeks gestation and if you do not have high blood pressure  Tylenol  Vicks Vaporub  Zicam Zinc lozenges  Zyrtec   Make sure to not take anything that has the active ingredient Phenylephrine   Constipation:  Colace  Ducolax suppositories  Fleet enema  Glycerin suppositories  Metamucil  Milk of magnesia  Miralax  Senokot  Smooth move tea   Diarrhea:  Kaopectate  Imodium A-D   *NO Pepto Bismol   Hemorrhoids:  Anusol  Anusol-HC  Preparation-H  Tucks   Indigestion:  Tums  Maalox  Mylanta  Nexium Pepcid   Zantac Prevacid Protonix Prilosec  Insomnia:  Benadryl 25 - 50 mg every 6 hours as needed  Tylenol PM  Unisom  Leg Cramps:  Tums  Magnesium tablets  Nausea/Vomiting:  Bonine  Dramamine  Emetrol  Ginger extract  Sea bands  Meclizine   Nausea medication to take during pregnancy:  Unisom (doxylamine succinate 25 mg tablets) Take one tablet daily at bedtime. If symptoms are not adequately controlled, the dose can be increased to a maximum recommended dose of two tablets daily (1/2 tablet in the morning, 1/2 tablet mid-afternoon and one at bedtime).  Vitamin B6 100mg tablets. Take one tablet twice a day (up to 200 mg per day).   Skin Rashes:  Aveeno products  Benadryl cream or Benadryl tablets 25mg every 6 hours as needed  Calamine Lotion  1% Hydrocortisone cream   Yeast infection:  Gyne-lotrimin 7  Monistat 3 or 7day   **If taking multiple medications, please check labels to avoid duplicating the same active ingredients  **Take medication as directed on the label ** ** Do not exceed 4000 mg of Tylenol/Acetaminophen in 24 hours**  **Do not take medications that contain Aspirin or Ibuprofen** Unless your doctor has prescribed low dose Aspirin for prevention of Pre-eclampsia       

## 2022-12-26 NOTE — Progress Notes (Signed)
PRENATAL VISIT NOTE  Subjective:  April Benson is a 27 y.o. G2P1001 at [redacted]w[redacted]d being seen today for ongoing prenatal care.  She is currently monitored for the following issues for this high-risk pregnancy and has Nausea and vomiting of pregnancy, antepartum; Hyperthyroidism complicating pregnancy; History of gestational hypertension; Supervision of high-risk pregnancy; Rubella non-immune status, antepartum; Thyrotoxicosis with thyrotoxic crisis; and Anemia in pregnancy, third trimester on their problem list.  Patient reports  4/10 headache .  Not alleviated by Flexeril and Tylenol. No visual changes or RUQ pain.  Contractions: Not present. Vag. Bleeding: None.  Movement: Present. Denies leaking of fluid.   The following portions of the patient's history were reviewed and updated as appropriate: allergies, current medications, past family history, past medical history, past social history, past surgical history and problem list.   Objective:   Vitals:   12/26/22 1447  BP: 117/71  Pulse: 86  Weight: 203 lb (92.1 kg)    Fetal Status: Fetal Heart Rate (bpm): 132   Movement: Present     General:  Alert, oriented and cooperative. Patient is in no acute distress.  Skin: Skin is warm and dry. No rash noted.   Cardiovascular: Normal heart rate noted  Respiratory: Normal respiratory effort, no problems with respiration noted  Abdomen: Soft, gravid, appropriate for gestational age.  Pain/Pressure: Absent     Pelvic: Cervical exam deferred        Extremities: Normal range of motion.  Edema: None  Mental Status: Normal mood and affect. Normal behavior. Normal judgment and thought content.    Imaging: LONG TERM MONITOR (3-14 DAYS)  Result Date: 12/25/2022 Patch Wear Time:  7 days and 1 hours (2024-05-17T15:07:39-0400 to 2024-05-24T16:36:13-0400) Patient had a min HR of 62 bpm, max HR of 149 bpm, and avg HR of 91 bpm. Predominant underlying rhythm was Sinus Rhythm. Isolated SVEs were rare  (<1.0%), SVE Couplets were rare (<1.0%), and SVE Triplets were rare (<1.0%). Isolated VEs were rare (<1.0%), VE Couplets were rare (<1.0%), and no VE Triplets were present. Symptoms associated with premature ventricular complex, premature atrial complexes and sinus rhythm. Conclusion: This study is remarkable for rare symptomatic premature ventricular complexes and premature atrial complex.  [place] MFM OB FOLLOW UP  Result Date: 12/18/2022 ----------------------------------------------------------------------  OBSTETRICS REPORT                       (Signed Final 12/18/2022 05:07 pm) ---------------------------------------------------------------------- Patient Info  ID #:       [id number]                          D.O.B.:  Mar 28, 1996 (27 yrs)  Name:       April Benson [facility]                  Visit Date: 12/18/2022 04:45 pm ---------------------------------------------------------------------- Performed By  Attending:        Lin Landsman      Secondary Phy.:   Everardo All                    MD  Performed By:     Kris Hartmann,      Address:          [phone]                    RDMS  Kathryne Sharper, [place]  Referred By:      Caren Hazy             Location:         [facility]                               [facility]  Ref. Address:     [address] ---------------------------------------------------------------------- Orders  #  Description                           Code        Ordered By  1  [place] MFM OB FOLLOW UP                   [phone]    Braxton Feathers ----------------------------------------------------------------------  #  Order #                     Accession #                Episode #  1  [id number]                   [id number]                 [id number]  ---------------------------------------------------------------------- Indications  [redacted] weeks gestation of pregnancy                Z3A.28  Hyperthyroid                                   O99.280 E05.90  Poor obstetric history: Previous               O09.299  preeclampsia / eclampsia/gestational HTN  NIPS LR female  Hypertension - Chronic/Pre-existing            O10.019  Obesity complicating pregnancy, third          O99.213  trimester ( BMI 36 )  Encounter for other antenatal screening        Z36.2  follow-up ---------------------------------------------------------------------- Fetal Evaluation  Num Of Fetuses:         1  Fetal Heart Rate(bpm):  146  Cardiac Activity:       Observed  Presentation:           Breech  Placenta:               Posterior  P. Cord Insertion:      Visualized  Amniotic Fluid  AFI FV:      Within normal limits  AFI Sum(cm)     %Tile       Largest Pocket(cm)  17.87           68          5.11  RUQ(cm)       RLQ(cm)       LUQ(cm)        LLQ(cm)  5.11          4.11          4.98           3.67 ---------------------------------------------------------------------- Biometry  BPD:      73.7  mm     G. Age:  29w 4d         61  %    CI:        74.51   %    70 - 86                                                          FL/HC:      20.0   %    19.6 - 20.8  HC:       271   mm     G. Age:  29w 4d         38  %    HC/AC:      1.10        0.99 - 1.21  AC:      247.4  mm     G. Age:  29w 0d         47  %    FL/BPD:     73.4   %    71 - 87  FL:       54.1  mm     G. Age:  28w 4d         28  %    FL/AC:      21.9   %    20 - 24  HUM:      49.3  mm     G. Age:  29w 0d         45  %  LV:          3  mm  Est. FW:    1312  gm    2 lb 14 oz      40  % ---------------------------------------------------------------------- OB History  Maternal Racial/Ethnic Group:   White  Gravidity:    2         Term:   1  Living:       1 ---------------------------------------------------------------------- Gestational Age  LMP:            28w 6d        Date:  05/30/22                  EDD:   03/06/23  U/S Today:     29w 1d                                        EDD:   03/04/23  Best:  28w 6d     Det. By:  LMP  (05/30/22)          EDD:   03/06/23 ---------------------------------------------------------------------- Anatomy  Cranium:               Appears normal         LVOT:                   Previously seen  Cavum:                 Appears normal         Aortic Arch:            Appears normal  Ventricles:            Appears normal         Ductal Arch:            Not well visualized  Choroid Plexus:        Previously seen        Diaphragm:              Appears normal  Cerebellum:            Previously seen        Stomach:                Appears normal, left                                                                        sided  Posterior Fossa:       Previously seen        Abdomen:                Appears normal  Nuchal Fold:           Previously seen        Abdominal Wall:         Previously seen  Face:                  Appears normal         Cord Vessels:           Previously seen                         (orbits )  Lips:                  Appears normal         Kidneys:                Previously seen  Palate:                Previously             Bladder:                Appears normal                         visualized  Thoracic:              Appears normal         Spine:  Previously seen  Heart:                 Previously seen        Upper Extremities:      Previously seen  RVOT:                  Previously seen        Lower Extremities:      Previously seen  Other:  SVC/IVC, 3VV, 3VTV previously visualized. Hands and feet          previously visualized. Heels previously seen. Nasal bone, lenses,          maxilla, mandible and falx  previously visualized. DA not well          visualized. ---------------------------------------------------------------------- Cervix Uterus Adnexa  Right Ovary  Not visualized.  Left  Ovary  Not visualized. ---------------------------------------------------------------------- Impression  Follow up growth due to hyperthyroidism  Normal interval growth with measurements consistent with  dates  Good fetal movement and amniotic fluid volume  Suboptimal views of the fetal ductus arteriosus was obtained  secondary to fetal position.  Her thyroid labs are elevated on 50 mg methimazole TID.  She feels better less palpatations. Will draw labs again soon.  Continue serial growth and initiate weekly testing at 32  weeks. ---------------------------------------------------------------------- Recommendations  Serial growth and antenatal testing as previously scheduled. ----------------------------------------------------------------------              Lin Landsman, MD Electronically Signed Final Report   12/18/2022 05:07 pm ----------------------------------------------------------------------  ECHOCARDIOGRAM COMPLETE  Result Date: 11/27/2022    ECHOCARDIOGRAM REPORT   Patient Name:   April Benson [facility] Date of Exam: 11/27/2022 Medical Rec #:  [id number]      Height:       63.0 in Accession #:    [id number]     Weight:       191.0 lb Date of Birth:  1995-10-10      BSA:          1.896 m Patient Age:    27 years       BP:           128/71 mmHg Patient Gender: F              HR:           91 bpm. Exam Location:  [address] Procedure: 2D Echo, 3D Echo, Cardiac Doppler, Color Doppler and Strain Analysis Indications:    785.1 Palpitations  History:        Patient has no prior history of Echocardiogram examinations. [redacted]                 weeks pregnant, No cardiac history.  Sonographer:    Clearence Ped RCS Referring Phys: [id number] HEATHER E [place] IMPRESSIONS  1. Left ventricular ejection fraction, by estimation, is 60 to 65%. Left ventricular ejection fraction by 3D volume is 60 %. The left ventricle has normal function. The left ventricle has no regional wall motion abnormalities. Left ventricular diastolic   parameters were normal. The average left ventricular global longitudinal strain is -21.4 %.  2. Right ventricular systolic function is normal. The right ventricular size is normal. There is normal pulmonary artery systolic pressure.  3. The mitral valve is normal in structure. Trivial mitral valve regurgitation. No evidence of mitral stenosis.  4. The aortic valve was not well visualized. Aortic valve regurgitation is not visualized. No aortic stenosis is present.  5. The inferior vena cava  is normal in size with greater than 50% respiratory variability, suggesting right atrial pressure of 3 mmHg. FINDINGS  Left Ventricle: Left ventricular ejection fraction, by estimation, is 60 to 65%. Left ventricular ejection fraction by 3D volume is 60 %. The left ventricle has normal function. The left ventricle has no regional wall motion abnormalities. The average left ventricular global longitudinal strain is -21.4 %. The left ventricular internal cavity size was normal in size. There is no left ventricular hypertrophy. Left ventricular diastolic parameters were normal. Right Ventricle: The right ventricular size is normal. No increase in right ventricular wall thickness. Right ventricular systolic function is normal. There is normal pulmonary artery systolic pressure. The tricuspid regurgitant velocity is 1.86 m/s, and  with an assumed right atrial pressure of 3 mmHg, the estimated right ventricular systolic pressure is 16.8 mmHg. Left Atrium: Left atrial size was normal in size. Right Atrium: Right atrial size was normal in size. Pericardium: Trivial pericardial effusion is present. Mitral Valve: The mitral valve is normal in structure. Trivial mitral valve regurgitation. No evidence of mitral valve stenosis. Tricuspid Valve: The tricuspid valve is normal in structure. Tricuspid valve regurgitation is trivial. Aortic Valve: The aortic valve was not well visualized. Aortic valve regurgitation is not visualized. No aortic  stenosis is present. Pulmonic Valve: The pulmonic valve was not well visualized. Pulmonic valve regurgitation is not visualized. Aorta: The aortic root and ascending aorta are structurally normal, with no evidence of dilitation. Venous: The inferior vena cava is normal in size with greater than 50% respiratory variability, suggesting right atrial pressure of 3 mmHg. IAS/Shunts: The interatrial septum was not well visualized.  LEFT VENTRICLE PLAX 2D LVIDd:         5.00 cm         Diastology LVIDs:         3.30 cm         LV e' medial:    14.00 cm/s LV PW:         1.00 cm         LV E/e' medial:  7.6 LV IVS:        0.70 cm         LV e' lateral:   14.40 cm/s LVOT diam:     2.00 cm         LV E/e' lateral: 7.4 LV SV:         59 LV SV Index:   31              2D LVOT Area:     3.14 cm        Longitudinal                                Strain                                2D Strain GLS  -20.0 %                                (A2C):                                2D Strain GLS  -20.8 %                                (  A3C):                                2D Strain GLS  -23.3 %                                (A4C):                                2D Strain GLS  -21.4 %                                Avg:                                 3D Volume EF                                LV 3D EF:    Left                                             ventricul                                             ar                                             ejection                                             fraction                                             by 3D                                             volume is                                             60 %.                                 3D Volume EF:                                3D EF:  60 %                                LV EDV:       160 ml                                LV ESV:       63 ml                                LV SV:        97 ml RIGHT VENTRICLE RV Basal  diam:  3.50 cm RV S prime:     17.30 cm/s RVSP:           16.8 mmHg LEFT ATRIUM             Index        RIGHT ATRIUM           Index LA diam:        4.20 cm 2.21 cm/m   RA Pressure: 3.00 mmHg LA Vol (A2C):   54.3 ml 28.64 ml/m  RA Area:     9.53 cm LA Vol (A4C):   64.2 ml 33.86 ml/m  RA Volume:   17.40 ml  9.18 ml/m LA Biplane Vol: 60.0 ml 31.64 ml/m  AORTIC VALVE LVOT Vmax:   107.00 cm/s LVOT Vmean:  67.300 cm/s LVOT VTI:    0.189 m  AORTA Ao Root diam: 2.70 cm Ao Asc diam:  3.00 cm MITRAL VALVE                TRICUSPID VALVE MV Area (PHT):              TR Peak grad:   13.8 mmHg MV Decel Time:              TR Vmax:        186.00 cm/s MV E velocity: 107.00 cm/s  Estimated RAP:  3.00 mmHg MV A velocity: 85.90 cm/s   RVSP:           16.8 mmHg MV E/A ratio:  1.25                             SHUNTS                             Systemic VTI:  0.19 m                             Systemic Diam: 2.00 cm Epifanio Lesches MD Electronically signed by Epifanio Lesches MD Signature Date/Time: 11/27/2022/5:54:49 PM    Final     Assessment and Plan:  Pregnancy: G2P1001 at [redacted]w[redacted]d 1. Pregnancy headache in third trimester Advised to continue Tylenol and Flexeril, keep hydrated.  Added caffeine and magnesium, will monitor effect. If no relief, will add Imitrex.   Normal BP. - magnesium oxide (MAG-OX) 400 MG tablet; Take 1 tablet (400 mg total) by mouth 2 (two) times daily as needed (headache).  Dispense: 30 tablet; Refill: 2 - caffeine 200 MG TABS tablet; Take 1 tablet (200 mg total) by mouth every 6 (six) hours as needed (headaches).  Dispense:  60 tablet; Refill: 2  2. Hyperthyroidism affecting pregnancy in second trimester Patient was discussed at multidisciplinary rounds today.  She is to get the following labs, TRAB is only obtained via [facility] so she will be sent to [facility] for them.  She is on Methimazole 50 mg tid, Followed by Endocrinology and MFM. Will start weekly antenatal testing soon. Normal EFW recently.  Storm precautions reviewed. - T3 - T4, free - TSH - TRAb (TSH Receptor Binding Antibody) - methimazole (TAPAZOLE) 10 MG tablet; Take 5 tablets (50 mg total) by mouth 3 (three) times daily.  Dispense: 450 tablet; Refill: 0  3. History of gestational hypertension Normal BP.  4. Anemia in pregnancy, third trimester On ferric maltol.  5. [redacted] weeks gestation of pregnancy 6. Supervision of high risk pregnancy in third trimester No other concerns.  Preterm labor symptoms and general obstetric precautions including but not limited to vaginal bleeding, contractions, leaking of fluid and fetal movement were reviewed in detail with the patient. Please refer to After Visit Summary for other counseling recommendations.   Return in about 2 weeks (around 01/09/2023) for OFFICE OB VISIT (MD only).  Future Appointments  Date Time Provider Department Center  01/13/2023  1:50 PM Lennart Pall, MD [facility]  01/13/2023  3:30 PM [facility] NURSE [facility]  01/13/2023  3:45 PM [facility] US4 [facility]  01/22/2023  3:00 PM [facility] NURSE [facility]  01/22/2023  3:15 PM [facility] NST [facility]  01/29/2023  3:00 PM [facility] NURSE [facility]  01/29/2023  3:15 PM [facility] NST [facility]  02/05/2023  3:00 PM [facility] NURSE [facility]  02/05/2023  3:15 PM [facility] NST [facility]  02/07/2023  3:20 PM Tobb, Kardie, DO [facility] None  02/10/2023  3:30 PM [facility] NURSE [facility]  02/10/2023  3:45 PM [facility] US4 [facility]  02/17/2023  3:00 PM [facility] NURSE [facility]  02/17/2023  3:15 PM [facility] NST [facility]  02/24/2023  3:00 PM [facility] NURSE [facility]  02/24/2023  3:15 PM [facility] NST [facility]    Jaynie Collins, MD

## 2022-12-27 LAB — TSH: TSH: <0.01 mIU/L — ABNORMAL LOW

## 2022-12-27 LAB — T3: T3, Total: 283 ng/dL — ABNORMAL HIGH (ref 76–181)

## 2022-12-28 LAB — T4, FREE: Free T4: 1.4 ng/dL (ref 0.8–1.8)

## 2022-12-28 LAB — TRAB (TSH RECEPTOR BINDING ANTIBODY): TRAB: 27.54 IU/L — ABNORMAL HIGH (ref <=2.00)

## 2023-01-08 ENCOUNTER — Encounter: Payer: Self-pay | Admitting: *Deleted

## 2023-01-13 ENCOUNTER — Telehealth (INDEPENDENT_AMBULATORY_CARE_PROVIDER_SITE_OTHER): Admitting: Obstetrics and Gynecology

## 2023-01-13 ENCOUNTER — Other Ambulatory Visit: Payer: Self-pay | Admitting: *Deleted

## 2023-01-13 ENCOUNTER — Ambulatory Visit: Attending: Maternal & Fetal Medicine

## 2023-01-13 ENCOUNTER — Encounter: Payer: Self-pay | Admitting: *Deleted

## 2023-01-13 ENCOUNTER — Ambulatory Visit: Admitting: *Deleted

## 2023-01-13 VITALS — BP 132/80

## 2023-01-13 DIAGNOSIS — O99283 Endocrine, nutritional and metabolic diseases complicating pregnancy, third trimester: Secondary | ICD-10-CM | POA: Insufficient documentation

## 2023-01-13 DIAGNOSIS — Z2839 Other underimmunization status: Secondary | ICD-10-CM

## 2023-01-13 DIAGNOSIS — Z3A32 32 weeks gestation of pregnancy: Secondary | ICD-10-CM

## 2023-01-13 DIAGNOSIS — E0590 Thyrotoxicosis, unspecified without thyrotoxic crisis or storm: Secondary | ICD-10-CM | POA: Diagnosis present

## 2023-01-13 DIAGNOSIS — O0993 Supervision of high risk pregnancy, unspecified, third trimester: Secondary | ICD-10-CM

## 2023-01-13 DIAGNOSIS — E669 Obesity, unspecified: Secondary | ICD-10-CM

## 2023-01-13 DIAGNOSIS — O09899 Supervision of other high risk pregnancies, unspecified trimester: Secondary | ICD-10-CM

## 2023-01-13 DIAGNOSIS — O10013 Pre-existing essential hypertension complicating pregnancy, third trimester: Secondary | ICD-10-CM | POA: Diagnosis not present

## 2023-01-13 DIAGNOSIS — O99013 Anemia complicating pregnancy, third trimester: Secondary | ICD-10-CM

## 2023-01-13 DIAGNOSIS — Z8759 Personal history of other complications of pregnancy, childbirth and the puerperium: Secondary | ICD-10-CM

## 2023-01-13 DIAGNOSIS — O99213 Obesity complicating pregnancy, third trimester: Secondary | ICD-10-CM | POA: Insufficient documentation

## 2023-01-13 NOTE — Progress Notes (Signed)
TELEHEALTH OBSTETRICS VISIT ENCOUNTER NOTE  Provider location: [facility] [place]   Patient location: Home  I connected with April Benson on 01/13/23 at  1:50 PM EDT by MyChart Audiovisual encounter.   Subjective:  April Benson is a 27 y.o. G2P1001 at [redacted]w[redacted]d being followed for ongoing prenatal care.  She is currently monitored for the following issues for this high-risk pregnancy and has Nausea and vomiting of pregnancy, antepartum; Hyperthyroidism complicating pregnancy; History of gestational hypertension; Supervision of high-risk pregnancy; Rubella non-immune status, antepartum; Thyrotoxicosis with thyrotoxic crisis; and Anemia in pregnancy, third trimester on their problem list.  Patient reports no complaints. Reports fetal movement. Denies any contractions, bleeding or leaking of fluid.   The following portions of the patient's history were reviewed and updated as appropriate: allergies, current medications, past family history, past medical history, past social history, past surgical history and problem list.   Objective:  Blood pressure 132/80, last menstrual period 05/30/2022, unknown if currently breastfeeding. General:  Alert, oriented and cooperative.   Mental Status: Normal mood and affect perceived. Normal judgment and thought content.  Rest of physical exam deferred due to type of encounter  Assessment and Plan:  Pregnancy: G2P1001 at [redacted]w[redacted]d 1. Supervision of high risk pregnancy in third trimester 2. [redacted] weeks gestation of pregnancy  3. Hyperthyroidism affecting pregnancy in third trimester Currently asymptomatic Methimazole 50mg  TID Last TFTs improving - TSH <0.01, total T3 283, fT4 1.4, TRAb 27.54 Reports much better control of her palpitations on propranolol 60mg  q8h Next appt with endo 7/14 Has growth [place] & BPP scheduled for today. Will f/u MFM recs, plan to continue weekly BPP until delivery  4. History of gestational  hypertension Normotensive at home ldASA  5. Rubella non-immune status, antepartum PP MMR  6. Anemia in pregnancy, third trimester Po iron  I discussed the assessment and treatment plan with the patient. The patient was provided an opportunity to ask questions and all were answered. The patient agreed with the plan and demonstrated an understanding of the instructions. The patient was advised to call back or seek an in-person office evaluation/go to [facility] at [facility] for any urgent or concerning symptoms.  Please refer to After Visit Summary for other counseling recommendations.   I provided 14 minutes of non-face-to-face time during this encounter.  Future Appointments  Date Time Provider Department Center  01/13/2023  3:30 PM [facility] NURSE [facility]  01/13/2023  3:45 PM [facility] US4 [facility]  01/22/2023  3:00 PM [facility] NURSE [facility]  01/22/2023  3:15 PM [facility] NST [facility]  01/27/2023  1:30 PM Lesly Dukes, MD [facility]  01/29/2023  3:00 PM [facility] NURSE [facility]  01/29/2023  3:15 PM [facility] NST [facility]  02/05/2023  3:00 PM [facility] NURSE [facility]  02/05/2023  3:15 PM [facility] NST [facility]  02/06/2023  2:50 PM Lennart Pall, MD [facility]  02/07/2023  3:20 PM Tobb, Kardie, DO [facility] None  02/10/2023  3:30 PM [facility] NURSE [facility]  02/10/2023  3:45 PM [facility] US4 [facility]  02/13/2023  1:30 PM Lennart Pall, MD [facility]  02/17/2023  3:00 PM [facility] NURSE [facility]  02/17/2023  3:15 PM [facility] NST [facility]  02/20/2023  2:50 PM Constant, Peggy, MD [facility]  02/24/2023  3:00 PM [facility] NURSE [facility]  02/24/2023  3:15 PM [facility] NST [facility]    Lennart Pall, MD [facility] [place] [facility]

## 2023-01-14 ENCOUNTER — Other Ambulatory Visit: Payer: Self-pay | Admitting: *Deleted

## 2023-01-14 DIAGNOSIS — O10913 Unspecified pre-existing hypertension complicating pregnancy, third trimester: Secondary | ICD-10-CM

## 2023-01-14 DIAGNOSIS — O99213 Obesity complicating pregnancy, third trimester: Secondary | ICD-10-CM

## 2023-01-16 ENCOUNTER — Telehealth: Payer: Self-pay

## 2023-01-16 NOTE — Telephone Encounter (Signed)
Spoke with patient about the 7/10 ultrasound appointment time to go with the NST.  Patient told me that her OB provider was going to schedule her for induction the week of 7/25

## 2023-01-20 ENCOUNTER — Ambulatory Visit

## 2023-01-21 ENCOUNTER — Ambulatory Visit: Attending: Obstetrics and Gynecology

## 2023-01-21 ENCOUNTER — Ambulatory Visit: Admitting: *Deleted

## 2023-01-21 ENCOUNTER — Telehealth: Payer: Self-pay | Admitting: *Deleted

## 2023-01-21 VITALS — BP 107/61 | HR 79

## 2023-01-21 DIAGNOSIS — O99283 Endocrine, nutritional and metabolic diseases complicating pregnancy, third trimester: Secondary | ICD-10-CM | POA: Diagnosis present

## 2023-01-21 DIAGNOSIS — O10013 Pre-existing essential hypertension complicating pregnancy, third trimester: Secondary | ICD-10-CM

## 2023-01-21 DIAGNOSIS — Z3A34 34 weeks gestation of pregnancy: Secondary | ICD-10-CM

## 2023-01-21 DIAGNOSIS — O0993 Supervision of high risk pregnancy, unspecified, third trimester: Secondary | ICD-10-CM

## 2023-01-21 DIAGNOSIS — O10913 Unspecified pre-existing hypertension complicating pregnancy, third trimester: Secondary | ICD-10-CM

## 2023-01-21 DIAGNOSIS — O09293 Supervision of pregnancy with other poor reproductive or obstetric history, third trimester: Secondary | ICD-10-CM

## 2023-01-21 DIAGNOSIS — O99213 Obesity complicating pregnancy, third trimester: Secondary | ICD-10-CM

## 2023-01-21 DIAGNOSIS — E0590 Thyrotoxicosis, unspecified without thyrotoxic crisis or storm: Secondary | ICD-10-CM | POA: Insufficient documentation

## 2023-01-21 DIAGNOSIS — E669 Obesity, unspecified: Secondary | ICD-10-CM

## 2023-01-21 DIAGNOSIS — Z3A33 33 weeks gestation of pregnancy: Secondary | ICD-10-CM

## 2023-01-21 NOTE — Telephone Encounter (Signed)
Left patient a message to see if she can move out of overbook slot on 01/27/2023 at 1:30 to 2:50 PM.

## 2023-01-21 NOTE — Procedures (Signed)
April Benson November 14, 1995 [redacted]w[redacted]d  Fetus A Non-Stress Test Interpretation for 01/21/23  Indication:  Hyperthyroidism  Fetal Heart Rate A Mode: External Baseline Rate (A): 135 bpm Variability: Moderate Accelerations: 15 x 15 Decelerations: None Multiple birth?: No  Uterine Activity Contraction Quality: Mild Resting Tone Palpated: Relaxed Resting Time: Adequate  Interpretation (Fetal Testing) Nonstress Test Interpretation: Reactive Comments: UI

## 2023-01-22 ENCOUNTER — Ambulatory Visit

## 2023-01-27 ENCOUNTER — Telehealth (INDEPENDENT_AMBULATORY_CARE_PROVIDER_SITE_OTHER): Admitting: Obstetrics & Gynecology

## 2023-01-27 ENCOUNTER — Encounter: Payer: Self-pay | Admitting: Obstetrics & Gynecology

## 2023-01-27 VITALS — BP 108/74 | HR 82 | Wt 203.0 lb

## 2023-01-27 DIAGNOSIS — O0993 Supervision of high risk pregnancy, unspecified, third trimester: Secondary | ICD-10-CM

## 2023-01-27 NOTE — Progress Notes (Signed)
OBSTETRICS PRENATAL VIRTUAL VISIT ENCOUNTER NOTE  Provider location: [facility] at [place]   Patient location: Home  I connected with April Benson on 01/27/23 at  1:50 PM EDT by MyChart Video Encounter and verified that I am speaking with the correct person using two identifiers. I discussed the limitations, risks, security and privacy concerns of performing an evaluation and management service virtually and the availability of in person appointments. I also discussed with the patient that there may be a patient responsible charge related to this service. The patient expressed understanding and agreed to proceed. Subjective:  April Benson is a 27 y.o. G2P1001 at [redacted]w[redacted]d being seen today for ongoing prenatal care.  She is currently monitored for the following issues for this high-risk pregnancy and has Nausea and vomiting of pregnancy, antepartum; Hyperthyroidism complicating pregnancy; History of gestational hypertension; Supervision of high-risk pregnancy; Rubella non-immune status, antepartum; Thyrotoxicosis with thyrotoxic crisis; and Anemia in pregnancy, third trimester on their problem list.  Patient reports  thyroid symptoms stable--monitored closely by MFM and endocrinologist.  .  Contractions: Not present. Vag. Bleeding: None.  Movement: Present. Denies any leaking of fluid.   The following portions of the patient's history were reviewed and updated as appropriate: allergies, current medications, past family history, past medical history, past social history, past surgical history and problem list.   Objective:   Vitals:   01/27/23 1350  BP: 108/74  Pulse: 82  Weight: 203 lb (92.1 kg)    Fetal Status:     Movement: Present     General:  Alert, oriented and cooperative. Patient is in no acute distress.  Respiratory: Normal respiratory effort, no problems with respiration noted  Mental Status: Normal mood and affect. Normal behavior. Normal judgment and  thought content.  Rest of physical exam deferred due to type of encounter  Imaging: [place] MFM FETAL BPP W/NONSTRESS  Result Date: 01/21/2023 ----------------------------------------------------------------------  OBSTETRICS REPORT                       (Signed Final 01/21/2023 10:42 am) ---------------------------------------------------------------------- Patient Info  ID #:       [id number]                          D.O.B.:  08-19-1995 (27 yrs)  Name:       April Benson [facility]                  Visit Date: 01/21/2023 07:21 am ---------------------------------------------------------------------- Performed By  Attending:        Noralee Space MD        Secondary Phy.:   [facility]  Performed By:     Emeline Darling BS,      Address:          [address]                    RD[address]                                                             [place], [place]  Referred By:      Caren Hazy             Location:  [facility]                    FORSYTH MD                               Fetal Care at                                                             [facility]                                                             Women  Ref. Address:     [address] ---------------------------------------------------------------------- Orders  #  Description                           Code        Ordered By  1  [place] MFM FETAL BPP                      40981.1     Noralee Space     W/NONSTRESS ----------------------------------------------------------------------  #  Order #                     Accession #                Episode #  1  [id number]                   [id number]                 [id number] ---------------------------------------------------------------------- Indications  Hyperthyroid                                   O99.280 E05.90  Hypertension - Chronic/Pre-existing            O10.019  Poor obstetric history: Previous               O09.299  preeclampsia / eclampsia/gestational HTN  Obesity  complicating pregnancy, third          O99.213  trimester ( BMI 36 )  NIPS LR female  [redacted] weeks gestation of pregnancy                Z3A.33 ---------------------------------------------------------------------- Vital Signs  BP:          107/61 ---------------------------------------------------------------------- Fetal Evaluation  Num Of Fetuses:         1  Fetal Heart Rate(bpm):  133  Cardiac Activity:       Observed  Presentation:           Cephalic  Placenta:               Posterior  P. Cord Insertion:      Previously visualized  Amniotic Fluid  AFI FV:      Within normal limits  AFI Sum(cm)     %Tile  Largest Pocket(cm)  17.45           64          4.76  RUQ(cm)       RLQ(cm)       LUQ(cm)        LLQ(cm)  4.76          4.36          4.07           4.26 ---------------------------------------------------------------------- Biophysical Evaluation  Amniotic F.V:   Pocket => 2 cm             F. Tone:        Observed  F. Movement:    Observed                   N.S.T:          Reactive  F. Breathing:   Observed                   Score:          10/10 ---------------------------------------------------------------------- OB History  Maternal Racial/Ethnic Group:   White  Gravidity:    2         Term:   1  Living:       1 ---------------------------------------------------------------------- Gestational Age  LMP:           33w 5d        Date:  05/30/22                  EDD:   03/06/23  Best:          33w 5d     Det. By:  LMP  (05/30/22)          EDD:   03/06/23 ---------------------------------------------------------------------- Anatomy  Diaphragm:             Appears normal         Bladder:                Appears normal  Stomach:               Appears normal, left                         sided ---------------------------------------------------------------------- Impression  -Hyperthyroidism with significantly increased TRAb levels.  Patient was seen by her endocrinologist ([facility])  on 01/13/23 and no  change in the dosage of methimazole  was made. Patient takes very high dose of methimazole  (MMZ)-50 mg tid (150 milligrams total daily dose). Blood  pressure today at our office is 107/61 mm Hg and pulse  79/min.  Patient takes propranolol 60 milligrams tid.  -History of thyroid storm in Dec 2023 and Jan 2024 (early  pregnancy).  On today's ultrasound, amniotic fluid is normal and good fetal  activity is seen. Antenatal testing is reassuring. NST is  reactive. BPP 10/10.  Fetal heart rate and rhythm appear normal. No evidence of  hydrops. Measurement of fetal thyroid was attempted. The  circumference (6.8 cm) is slightly more than the 95th  percentile.  I had informal discussion with our endocrinologist [facility]) who felt that the dose of MMZ should not be reduce  because of thyroid storm concerns. I counseled the patient  that I anticipate neonatal hypothyroidism because of high  doses of MMZ.  Neonatal hyperthyroidism can occur later because of  persistence of TRAb antibodies.  I recommend delivery at  37 weeks. ---------------------------------------------------------------------- Recommendations  -Continue weekly BPP and NST.  -Delivery at 37 weeks' gestation. ----------------------------------------------------------------------                 Noralee Space, MD Electronically Signed Final Report   01/21/2023 10:42 am ----------------------------------------------------------------------  [place] MFM OB FOLLOW UP  Result Date: 01/14/2023 ----------------------------------------------------------------------  OBSTETRICS REPORT                       (Signed Final 01/14/2023 09:39 am) ---------------------------------------------------------------------- Patient Info  ID #:       [id number]                          D.O.B.:  [birth year] (27 yrs)  Name:       LATANGA NEDROW [facility]                  Visit Date: 01/13/2023 03:42 pm ---------------------------------------------------------------------- Performed By  Attending:         Noralee Space MD        Secondary Phy.:   [facility]  Performed By:     Reinaldo Raddle            Address:          [id number]                    RD[address]                                                             [place], [place]  Referred By:      Caren Hazy             Location:         [facility] Syn[facility] Innocent MD                               [facility] at                                                             [facility]                                                             [facility]  Ref. Address:     [address] ---------------------------------------------------------------------- Orders  #  Description                           Code        Ordered By  1  [place] MFM OB FOLLOW UP                   [id number]  BURK SCHAIBLE  2  [place] MFM FETAL BPP WO NON               [id number]    [facility]     STRESS ----------------------------------------------------------------------  #  Order #                     Accession #                Episode #  1  [id number]                   [id number]                 [id number]  2  [id number]                   [id number]                 [id number] ---------------------------------------------------------------------- Indications  Hyperthyroid                                   O99.280 E05.90  Poor obstetric history: Previous               O09.299  preeclampsia / eclampsia/gestational HTN  NIPS LR female  Hypertension - Chronic/Pre-existing            O10.019  Obesity complicating pregnancy, third          O99.213  trimester ( BMI 36 )  [redacted] weeks gestation of pregnancy                Z3A.32 ---------------------------------------------------------------------- Fetal Evaluation  Num Of Fetuses:         1  Fetal Heart Rate(bpm):  144  Cardiac Activity:       Observed  Presentation:           Cephalic  Placenta:               Posterior  P. Cord Insertion:      Previously visualized  Amniotic Fluid  AFI FV:      Within normal limits  AFI Sum(cm)      %Tile       Largest Pocket(cm)  15.94           57          5.76  RUQ(cm)       RLQ(cm)       LUQ(cm)        LLQ(cm)  4.2           3.01          5.76           2.97 ---------------------------------------------------------------------- Biophysical Evaluation  Amniotic F.V:   Within normal limits       F. Tone:        Observed  F. Movement:    Observed                   Score:          8/8  F. Breathing:   Observed ---------------------------------------------------------------------- Biometry  BPD:      84.8  mm     G. Age:  34w 1d         85  %    CI:        81.15   %    70 - 86  FL/HC:      20.1   %    19.9 - 21.5  HC:      297.2  mm     G. Age:  32w 6d         21  %    HC/AC:      1.00        0.96 - 1.11  AC:      297.2  mm     G. Age:  33w 5d         81  %    FL/BPD:     70.5   %    71 - 87  FL:       59.8  mm     G. Age:  31w 1d          9  %    FL/AC:      20.1   %    20 - 24  HUM:        52  mm     G. Age:  30w 2d         10  %  Est. FW:    2081  gm      4 lb 9 oz     51  % ---------------------------------------------------------------------- OB History  Maternal Racial/Ethnic Group:   White  Gravidity:    2         Term:   1  Living:       1 ---------------------------------------------------------------------- Gestational Age  LMP:           32w 4d        Date:  05/30/22                  EDD:   03/06/23  U/S Today:     33w 0d                                        EDD:   03/03/23  Best:          32w 4d     Det. By:  LMP  (05/30/22)          EDD:   03/06/23 ---------------------------------------------------------------------- Anatomy  Cranium:               Appears normal         LVOT:                   Previously seen  Cavum:                 Appears normal         Aortic Arch:            Previously seen  Ventricles:            Appears normal         Ductal Arch:            Not well visualized  Choroid Plexus:        Previously seen        Diaphragm:               Appears normal  Cerebellum:            Previously seen        Stomach:  Appears normal, left                                                                        sided  Posterior Fossa:       Previously seen        Abdomen:                Appears normal  Nuchal Fold:           Previously seen        Abdominal Wall:         Previously seen  Face:                  Profile nl; orbits     Cord Vessels:           Previously seen                         prev  visualized  Lips:                  Previously seen        Kidneys:                Appear normal  Palate:                Previously             Bladder:                Appears normal                         visualized  Thoracic:              Appears normal         Spine:                  Previously seen  Heart:                 Appears normal         Upper Extremities:      Previously seen                         (4CH, axis, and                         situs)  RVOT:                  Previously seen        Lower Extremities:      Previously seen  Other:  SVC/IVC, 3VV, 3VTV previously visualized. Hands and feet          previously visualized. Heels previously seen. Nasal bone, lenses,          maxilla, mandible and falx  previously visualized. DA not well          visualized. ---------------------------------------------------------------------- Cervix Uterus Adnexa  Cervix  Not visualized (advanced GA >24wks)  Uterus  No abnormality visualized.  Right Ovary  Size(cm)     3.06   x   2.01   x  1.34  Vol(ml): 4.32  Within normal limits.  Left Ovary  Not visualized.  Cul De Sac  No free fluid seen.  Adnexa  No adnexal mass visualized ---------------------------------------------------------------------- Impression  Patient returned for fetal growth assessment. She has the  following high-risk problems:  -Hyperthyroidism with significantly increased TRAb levels.  -Very high doses of methimazole (MMZ)-50 mg tid (150  milligrams total daily dose).   -History of thyroid storm in Dec 2023 and Jan 2024 (early  pregnancy).  Patient reports she is being followed by her endocrinologist  (Internal Medicine), Dr. Mathis Bud [facility]). From  [facility] , I see her most-recent appointment with  him was on 11/19/22 and she has been in touch with his NP.  She was told that they would attempt to reduce MMZ dosage.  On ultrasound, amniotic fluid is normal and good fetal activity  is seen. Fetal growth is appropriate for gestational age.  Cephalic presentation. Antenatal testing is reassuring. BPP  8/8. Fetal heart rate and rhythm are normal and there is no  evidence of hydrops. No clear evidence of goiter. I could not  measure the thyroid gland.  I counseled the patient at ultrasound and later the following  morning after reviewing her chart. Patient gave permission to  share her health information with our endocrinologist.  We have the following concerns:  -High doses of antithyroid drug (ATD) can lead to fetal  hypothyroidism and the ATD is usually reduced or stopped at  around 36 weeks for fetal thyroid to recover. However, our  patient is taking very high doses of MMZ and stopping or  reducing the medication can precipitate thyroid storm.  I informed her that we will discuss with our endocrinologist  and decide whether it will be prudent to reduce ATD as  inpatient to monitor for thyroid storm complication.  -TRAb is usually stimulatory antibody (leading to fetal or  neonatal hyperthyroidism). In the newborn, after ATD's  effects wear off, persistence of TRAb can cause neonatal  hyperthyroidism.  I counseled the patient that delivery at 37 weeks' gestation is  reasonable to prevent ATD effects on the fetus. Delivery at 37  weeks is associated with a slight increase in neonatal  complications (as opposed to delivery at 39 weeks). Patient  desires delivery at 37 weeks. ---------------------------------------------------------------------- Recommendations   -Continue weekly BPP and NST.  -Will discuss with our endocrinologist (Dr. Lonzo Cloud). ----------------------------------------------------------------------                 Noralee Space, MD Electronically Signed Final Report   01/14/2023 09:39 am ----------------------------------------------------------------------  [place] MFM FETAL BPP WO NON STRESS  Result Date: 01/14/2023 ----------------------------------------------------------------------  OBSTETRICS REPORT                       (Signed Final 01/14/2023 09:39 am) ---------------------------------------------------------------------- Patient Info  ID #:       [id number]                          D.O.B.:  03/28/1996 (27 yrs)  Name:       COLLEENE SWARTHOUT [facility]                  Visit Date: 01/13/2023 03:42 pm ---------------------------------------------------------------------- Performed By  Attending:        Noralee Space MD        Secondary Phy.:   [facility]  Performed By:     Reinaldo Raddle  Address:          [address]                                                             [place], [place]  Referred By:      Caren Hazy             Location:         [facility]                               [facility] at                                                             [facility]  Ref. Address:     [address] ---------------------------------------------------------------------- Orders  #  Description                           Code        Ordered By  1  [place] MFM OB FOLLOW UP                   [id number]    Braxton Feathers  2  [place] MFM FETAL BPP WO NON               [id number]    [facility]     STRESS ----------------------------------------------------------------------  #  Order #                     Accession #                Episode #  1  [id number]                   [id number]                 [id number]  2  [id number]                    [id number]                 [id number] ---------------------------------------------------------------------- Indications  Hyperthyroid                                   O99.280 E05.90  Poor obstetric history: Previous  O09.299  preeclampsia / eclampsia/gestational HTN  NIPS LR female  Hypertension - Chronic/Pre-existing            O10.019  Obesity complicating pregnancy, third          O99.213  trimester ( BMI 36 )  [redacted] weeks gestation of pregnancy                Z3A.32 ---------------------------------------------------------------------- Fetal Evaluation  Num Of Fetuses:         1  Fetal Heart Rate(bpm):  144  Cardiac Activity:       Observed  Presentation:           Cephalic  Placenta:               Posterior  P. Cord Insertion:      Previously visualized  Amniotic Fluid  AFI FV:      Within normal limits  AFI Sum(cm)     %Tile       Largest Pocket(cm)  15.94           57          5.76  RUQ(cm)       RLQ(cm)       LUQ(cm)        LLQ(cm)  4.2           3.01          5.76           2.97 ---------------------------------------------------------------------- Biophysical Evaluation  Amniotic F.V:   Within normal limits       F. Tone:        Observed  F. Movement:    Observed                   Score:          8/8  F. Breathing:   Observed ---------------------------------------------------------------------- Biometry  BPD:      84.8  mm     G. Age:  34w 1d         85  %    CI:        81.15   %    70 - 86                                                          FL/HC:      20.1   %    19.9 - 21.5  HC:      297.2  mm     G. Age:  32w 6d         21  %    HC/AC:      1.00        0.96 - 1.11  AC:      297.2  mm     G. Age:  33w 5d         81  %    FL/BPD:     70.5   %    71 - 87  FL:       59.8  mm     G. Age:  31w 1d          9  %    FL/AC:      20.1   %    20 - 24  HUM:  52  mm     G. Age:  30w 2d         10  %  Est. FW:    2081  gm      4 lb 9 oz     51  %  ---------------------------------------------------------------------- OB History  Maternal Racial/Ethnic Group:   White  Gravidity:    2         Term:   1  Living:       1 ---------------------------------------------------------------------- Gestational Age  LMP:           32w 4d        Date:  05/30/22                  EDD:   03/06/23  U/S Today:     33w 0d                                        EDD:   03/03/23  Best:          32w 4d     Det. By:  LMP  (05/30/22)          EDD:   03/06/23 ---------------------------------------------------------------------- Anatomy  Cranium:               Appears normal         LVOT:                   Previously seen  Cavum:                 Appears normal         Aortic Arch:            Previously seen  Ventricles:            Appears normal         Ductal Arch:            Not well visualized  Choroid Plexus:        Previously seen        Diaphragm:              Appears normal  Cerebellum:            Previously seen        Stomach:                Appears normal, left                                                                        sided  Posterior Fossa:       Previously seen        Abdomen:                Appears normal  Nuchal Fold:           Previously seen        Abdominal Wall:         Previously seen  Face:                  Profile nl; orbits     Cord Vessels:  Previously seen                         prev  visualized  Lips:                  Previously seen        Kidneys:                Appear normal  Palate:                Previously             Bladder:                Appears normal                         visualized  Thoracic:              Appears normal         Spine:                  Previously seen  Heart:                 Appears normal         Upper Extremities:      Previously seen                         (4CH, axis, and                         situs)  RVOT:                  Previously seen        Lower Extremities:      Previously seen  Other:  SVC/IVC,  3VV, 3VTV previously visualized. Hands and feet          previously visualized. Heels previously seen. Nasal bone, lenses,          maxilla, mandible and falx  previously visualized. DA not well          visualized. ---------------------------------------------------------------------- Cervix Uterus Adnexa  Cervix  Not visualized (advanced GA >24wks)  Uterus  No abnormality visualized.  Right Ovary  Size(cm)     3.06   x   2.01   x  1.34      Vol(ml): 4.32  Within normal limits.  Left Ovary  Not visualized.  Cul De Sac  No free fluid seen.  Adnexa  No adnexal mass visualized ---------------------------------------------------------------------- Impression  Patient returned for fetal growth assessment. She has the  following high-risk problems:  -Hyperthyroidism with significantly increased TRAb levels.  -Very high doses of methimazole (MMZ)-50 mg tid (150  milligrams total daily dose).  -History of thyroid storm in Dec 2023 and Jan 2024 (early  pregnancy).  Patient reports she is being followed by her endocrinologist  (Internal Medicine), Dr. Mathis Bud [facility] From  [facility] , I see her most-recent appointment with  him was on 11/19/22 and she has been in touch with his NP.  She was told that they would attempt to reduce MMZ dosage.  On ultrasound, amniotic fluid is normal and good fetal activity  is seen. Fetal growth is appropriate for gestational age.  Cephalic presentation. Antenatal testing is reassuring. BPP  8/8. Fetal heart rate and rhythm are normal and there is no  evidence of  hydrops. No clear evidence of goiter. I could not  measure the thyroid gland.  I counseled the patient at ultrasound and later the following  morning after reviewing her chart. Patient gave permission to  share her health information with our endocrinologist.  We have the following concerns:  -High doses of antithyroid drug (ATD) can lead to fetal  hypothyroidism and the ATD is usually reduced or stopped at   around 36 weeks for fetal thyroid to recover. However, our  patient is taking very high doses of MMZ and stopping or  reducing the medication can precipitate thyroid storm.  I informed her that we will discuss with our endocrinologist  and decide whether it will be prudent to reduce ATD as  inpatient to monitor for thyroid storm complication.  -TRAb is usually stimulatory antibody (leading to fetal or  neonatal hyperthyroidism). In the newborn, after ATD's  effects wear off, persistence of TRAb can cause neonatal  hyperthyroidism.  I counseled the patient that delivery at 37 weeks' gestation is  reasonable to prevent ATD effects on the fetus. Delivery at 37  weeks is associated with a slight increase in neonatal  complications (as opposed to delivery at 39 weeks). Patient  desires delivery at 37 weeks. ---------------------------------------------------------------------- Recommendations  -Continue weekly BPP and NST.  -Will discuss with our endocrinologist (Dr. Lonzo Cloud). ----------------------------------------------------------------------                 Noralee Space, MD Electronically Signed Final Report   01/14/2023 09:39 am ----------------------------------------------------------------------   Assessment and Plan:  Pregnancy: G2P1001 at [redacted]w[redacted]d 1. Supervision of high risk pregnancy in third trimester/anemia - CBC - Ferritin -IV iron if anemia is not respoding  2.  Hyperthyroid Cointinue endo/MFM plan for meds; peds aware; Red chart rounds Induction at 37 weeks Continue MFM antenatal surveillance.   Preterm labor symptoms and general obstetric precautions including but not limited to vaginal bleeding, contractions, leaking of fluid and fetal movement were reviewed in detail with the patient. I discussed the assessment and treatment plan with the patient. The patient was provided an opportunity to ask questions and all were answered. The patient agreed with the plan and demonstrated an  understanding of the instructions. The patient was advised to call back or seek an in-person office evaluation/go to MAU at [facility] for any urgent or concerning symptoms. Please refer to After Visit Summary for other counseling recommendations.   I provided 13 minutes of face-to-face time during this encounter.  Return in about 2 years (around 01/26/2025).  Future Appointments  Date Time Provider Department Center  01/29/2023  2:30 PM [facility] NURSE [facility]  01/29/2023  2:45 PM [facility] US6 [facility]  01/29/2023  3:15 PM [facility] NST [facility]  02/03/2023  1:00 PM [facility] NURSE [facility]  02/03/2023  1:15 PM [facility] NST [facility]  02/03/2023  2:30 PM [facility] US3 [facility]  02/06/2023  2:50 PM Lennart Pall, MD [facility]  02/07/2023  3:20 PM Tobb, Lavona Mound, DO [facility] None  02/10/2023  2:00 PM [facility] NURSE [facility]  02/10/2023  2:15 PM [facility] NST [facility]  02/10/2023  3:45 PM [facility] US4 [facility]  02/13/2023  1:30 PM Lennart Pall, MD [facility]  02/20/2023  2:50 PM Constant, Gigi Gin, MD [facility]    Elsie Lincoln, MD Center for [facility], [facility]

## 2023-01-27 NOTE — Progress Notes (Signed)
Only c/o is lower back pain

## 2023-01-28 ENCOUNTER — Other Ambulatory Visit: Payer: Self-pay | Admitting: Obstetrics & Gynecology

## 2023-01-28 ENCOUNTER — Telehealth: Payer: Self-pay | Admitting: Pharmacy Technician

## 2023-01-28 NOTE — Telephone Encounter (Addendum)
Dr. Penne Lash, [facility] note:  Patient will be scheduled as soon as possible.  Auth Submission: NO AUTH NEEDED Site of care: Site of care: [facility] Payer: [facility]  Medication & CPT/J Code(s) submitted: Venofer (Iron Sucrose) J1756 Route of submission (phone, fax, portal):  Phone # Fax # Auth type: Buy/Bill Units/visits requested: 2 Reference number:  Approval from: 01/28/23 to 05/31/23

## 2023-01-29 ENCOUNTER — Ambulatory Visit (HOSPITAL_BASED_OUTPATIENT_CLINIC_OR_DEPARTMENT_OTHER)

## 2023-01-29 ENCOUNTER — Ambulatory Visit: Attending: Maternal & Fetal Medicine | Admitting: *Deleted

## 2023-01-29 ENCOUNTER — Ambulatory Visit: Admitting: *Deleted

## 2023-01-29 VITALS — BP 112/64 | HR 86

## 2023-01-29 DIAGNOSIS — E0590 Thyrotoxicosis, unspecified without thyrotoxic crisis or storm: Secondary | ICD-10-CM | POA: Insufficient documentation

## 2023-01-29 DIAGNOSIS — O10913 Unspecified pre-existing hypertension complicating pregnancy, third trimester: Secondary | ICD-10-CM | POA: Insufficient documentation

## 2023-01-29 DIAGNOSIS — O113 Pre-existing hypertension with pre-eclampsia, third trimester: Secondary | ICD-10-CM | POA: Diagnosis not present

## 2023-01-29 DIAGNOSIS — O09293 Supervision of pregnancy with other poor reproductive or obstetric history, third trimester: Secondary | ICD-10-CM | POA: Insufficient documentation

## 2023-01-29 DIAGNOSIS — Z3A34 34 weeks gestation of pregnancy: Secondary | ICD-10-CM | POA: Diagnosis not present

## 2023-01-29 DIAGNOSIS — O0993 Supervision of high risk pregnancy, unspecified, third trimester: Secondary | ICD-10-CM

## 2023-01-29 DIAGNOSIS — E669 Obesity, unspecified: Secondary | ICD-10-CM

## 2023-01-29 DIAGNOSIS — O99283 Endocrine, nutritional and metabolic diseases complicating pregnancy, third trimester: Secondary | ICD-10-CM | POA: Diagnosis not present

## 2023-01-29 DIAGNOSIS — O99213 Obesity complicating pregnancy, third trimester: Secondary | ICD-10-CM | POA: Insufficient documentation

## 2023-01-29 DIAGNOSIS — O10013 Pre-existing essential hypertension complicating pregnancy, third trimester: Secondary | ICD-10-CM | POA: Diagnosis not present

## 2023-01-29 NOTE — Procedures (Signed)
April Benson 04/07/1996 [redacted]w[redacted]d  Fetus A Non-Stress Test Interpretation for 01/29/23--bpp with NST  Indication:  hyperthyroidism, obese  Fetal Heart Rate A Mode: External Baseline Rate (A): 130 bpm Variability: Moderate Accelerations: 15 x 15 Decelerations: None Multiple birth?: No  Uterine Activity Mode: Toco Resting Tone Palpated: Relaxed  Interpretation (Fetal Testing) Nonstress Test Interpretation: Reactive Comments: Tracing reviewed by Dr. Judeth Cornfield

## 2023-01-30 ENCOUNTER — Encounter (HOSPITAL_COMMUNITY): Payer: Self-pay | Admitting: *Deleted

## 2023-01-30 ENCOUNTER — Telehealth (HOSPITAL_COMMUNITY): Payer: Self-pay | Admitting: *Deleted

## 2023-01-30 DIAGNOSIS — O99210 Obesity complicating pregnancy, unspecified trimester: Secondary | ICD-10-CM | POA: Insufficient documentation

## 2023-01-30 DIAGNOSIS — O10919 Unspecified pre-existing hypertension complicating pregnancy, unspecified trimester: Secondary | ICD-10-CM | POA: Insufficient documentation

## 2023-01-30 NOTE — Telephone Encounter (Signed)
Preadmission screen  

## 2023-01-30 NOTE — Telephone Encounter (Signed)
Dr. Penne Lash, When entering the therapy plan it automatically default to x5 doses, unless you change it. We will update the treatment plan and schedule patient for only x2 doses.  @Yatin , Please update treatment to x2 doses.  Thanks Selena Batten

## 2023-02-01 ENCOUNTER — Inpatient Hospital Stay (HOSPITAL_COMMUNITY)

## 2023-02-01 ENCOUNTER — Encounter (HOSPITAL_COMMUNITY): Payer: Self-pay | Admitting: Obstetrics & Gynecology

## 2023-02-01 ENCOUNTER — Inpatient Hospital Stay (HOSPITAL_COMMUNITY)
Admission: AD | Admit: 2023-02-01 | Discharge: 2023-02-01 | Disposition: A | Discharge status: Home / Self Care | Attending: Obstetrics & Gynecology | Admitting: Obstetrics & Gynecology

## 2023-02-01 DIAGNOSIS — Z3A35 35 weeks gestation of pregnancy: Secondary | ICD-10-CM | POA: Diagnosis not present

## 2023-02-01 DIAGNOSIS — O99283 Endocrine, nutritional and metabolic diseases complicating pregnancy, third trimester: Secondary | ICD-10-CM

## 2023-02-01 DIAGNOSIS — O99413 Diseases of the circulatory system complicating pregnancy, third trimester: Secondary | ICD-10-CM | POA: Diagnosis not present

## 2023-02-01 DIAGNOSIS — O26893 Other specified pregnancy related conditions, third trimester: Secondary | ICD-10-CM | POA: Diagnosis present

## 2023-02-01 DIAGNOSIS — E0590 Thyrotoxicosis, unspecified without thyrotoxic crisis or storm: Secondary | ICD-10-CM

## 2023-02-01 DIAGNOSIS — Z8349 Family history of other endocrine, nutritional and metabolic diseases: Secondary | ICD-10-CM | POA: Insufficient documentation

## 2023-02-01 DIAGNOSIS — Z8249 Family history of ischemic heart disease and other diseases of the circulatory system: Secondary | ICD-10-CM | POA: Insufficient documentation

## 2023-02-01 DIAGNOSIS — R0602 Shortness of breath: Secondary | ICD-10-CM | POA: Diagnosis not present

## 2023-02-01 DIAGNOSIS — R002 Palpitations: Secondary | ICD-10-CM

## 2023-02-01 LAB — CBC
HCT: 29.4 % — ABNORMAL LOW (ref 36.0–46.0)
Hemoglobin: 9.3 g/dL — ABNORMAL LOW (ref 12.0–15.0)
MCH: 24.5 pg — ABNORMAL LOW (ref 26.0–34.0)
MCHC: 31.6 g/dL (ref 30.0–36.0)
MCV: 77.6 fL — ABNORMAL LOW (ref 80.0–100.0)
Platelets: 237 10*3/uL (ref 150–400)
RBC: 3.79 MIL/uL — ABNORMAL LOW (ref 3.87–5.11)
RDW: 14.2 % (ref 11.5–15.5)
WBC: 8.6 10*3/uL (ref 4.0–10.5)
nRBC: 0 % (ref 0.0–0.2)

## 2023-02-01 LAB — URINALYSIS, ROUTINE W REFLEX MICROSCOPIC
Bacteria, UA: NONE SEEN
Bilirubin Urine: NEGATIVE
Glucose, UA: 50 mg/dL — AB
Hgb urine dipstick: NEGATIVE
Ketones, ur: NEGATIVE mg/dL
Nitrite: NEGATIVE
Protein, ur: NEGATIVE mg/dL
Specific Gravity, Urine: 1.012 (ref 1.005–1.030)
pH: 6 (ref 5.0–8.0)

## 2023-02-01 LAB — BASIC METABOLIC PANEL
Anion gap: 11 (ref 5–15)
BUN: 7 mg/dL (ref 6–20)
CO2: 20 mmol/L — ABNORMAL LOW (ref 22–32)
Calcium: 8.7 mg/dL — ABNORMAL LOW (ref 8.9–10.3)
Chloride: 105 mmol/L (ref 98–111)
Creatinine, Ser: 0.48 mg/dL (ref 0.44–1.00)
GFR, Estimated: >60 mL/min (ref >60)
Glucose, Bld: 124 mg/dL — ABNORMAL HIGH (ref 70–99)
Potassium: 3.4 mmol/L — ABNORMAL LOW (ref 3.5–5.1)
Sodium: 136 mmol/L (ref 135–145)

## 2023-02-01 LAB — TSH: TSH: <0.01 u[IU]/mL — ABNORMAL LOW (ref 0.350–4.500)

## 2023-02-01 LAB — T4, FREE: Free T4: 0.62 ng/dL (ref 0.61–1.12)

## 2023-02-01 NOTE — MAU Provider Note (Cosign Needed Addendum)
History     CSN: [id number]  Arrival date and time: 02/01/23 1840   Event Date/Time   First Provider Initiated Contact with Patient 02/01/23 1907      Chief Complaint  Patient presents with   Chest Pain   Shortness of Breath   April Benson is a 27 y.o. G2P1001 at [redacted]w[redacted]d who presents for 1 day of chest pain and shortness of breath. She started to feel palpitations several hours ago while at her in-law's house. She took her propranolol which did not improve the symptoms. She then took an aspirin which did not improve the symptoms. She is concerned about thyroid storm.    OB History     Gravida  2   Para  1   Term  1   Preterm      AB      Living  1      SAB      IAB      Ectopic      Multiple  0   Live Births  1           Past Medical History:  Diagnosis Date   Anemia    E. coli UTI (urinary tract infection) 09/25/2022   TOC [ ]     Hypertension    Hyperthyroidism    Nausea and vomiting of pregnancy, antepartum 08/09/2021   Scheduled zofran & reglan q6h & prn PO phenergan qHS  Required twice weekly IV fluids through mid-second trimester in last pregnancy   Tachycardia     Past Surgical History:  Procedure Laterality Date   NO PAST SURGERIES      Family History  Problem Relation Age of Onset   Hypertension Mother    Diabetes Mother    Hypothyroidism Mother    Luiz Blare' disease Mother     Social History   Tobacco Use   Smoking status: Never   Smokeless tobacco: Never  Vaping Use   Vaping status: Never Used  Substance Use Topics   Alcohol use: Not Currently   Drug use: Never    Allergies: No Known Allergies  Facility-Administered Medications Prior to Admission  Medication Dose Route Frequency Provider Last Rate Last Admin   promethazine (PHENERGAN) injection 12.5 mg  12.5 mg Intravenous Once Lesly Dukes, MD       Medications Prior to Admission  Medication Sig Dispense Refill Last Dose   aspirin EC 81 MG tablet Take 1 tablet  (81 mg total) by mouth daily. Start taking when you are [redacted] weeks pregnant for rest of pregnancy for prevention of preeclampsia 300 tablet 2 02/01/2023 at 1800   Ferric Maltol 30 MG CAPS Take 1 capsule (30 mg total) by mouth 2 (two) times daily. Please take one hour before breakfast and dinner 60 capsule 2 02/01/2023   folic acid (FOLATE) 400 MCG tablet Take 1 tablet (400 mcg total) by mouth daily. 90 tablet 3 02/01/2023   methimazole (TAPAZOLE) 10 MG tablet Take 5 tablets (50 mg total) by mouth 3 (three) times daily. 450 tablet 0 02/01/2023   Prenatal Vit-Fe Fumarate-FA (PRENATAL MULTIVITAMIN) TABS tablet Take 1 tablet by mouth daily at 12 noon.   02/01/2023   propranolol (INDERAL) 60 MG tablet Take 1 tablet (60 mg total) by mouth 3 (three) times daily. 270 tablet 3 02/01/2023 at 1800   caffeine 200 MG TABS tablet Take 1 tablet (200 mg total) by mouth every 6 (six) hours as needed (headaches). 60 tablet 2    cyclobenzaprine (  FLEXERIL) 10 MG tablet Take 1 tablet (10 mg total) by mouth 2 (two) times daily as needed for muscle spasms. 20 tablet 0 Unknown   magnesium oxide (MAG-OX) 400 MG tablet Take 1 tablet (400 mg total) by mouth 2 (two) times daily as needed (headache). 30 tablet 2    ondansetron (ZOFRAN-ODT) 8 MG disintegrating tablet Take 1 tablet (8 mg total) by mouth every 8 (eight) hours as needed for nausea or vomiting. 90 tablet 3 Unknown    Review of Systems  Constitutional:  Negative for appetite change and fever.  HENT:  Negative for sore throat.   Respiratory:  Positive for shortness of breath.   Cardiovascular:  Positive for chest pain and palpitations. Negative for leg swelling.  Genitourinary:  Negative for difficulty urinating and vaginal bleeding.  Psychiatric/Behavioral:  Negative for sleep disturbance.    Physical Exam   Blood pressure 118/70, pulse 90, temperature 98.4 F (36.9 C), temperature source Oral, resp. rate 17, height 5\' 3"  (1.6 m), weight 94.3 kg, last menstrual period  05/30/2022, SpO2 97%, unknown if currently breastfeeding.  Physical Exam Vitals and nursing note reviewed.  Constitutional:      General: She is not in acute distress.    Appearance: She is not ill-appearing, toxic-appearing or diaphoretic.  Eyes:     Extraocular Movements: Extraocular movements intact.  Cardiovascular:     Rate and Rhythm: Normal rate and regular rhythm.     Heart sounds: Normal heart sounds. No murmur heard. Pulmonary:     Effort: Pulmonary effort is normal. No tachypnea, accessory muscle usage or respiratory distress.     Breath sounds: No stridor. Decreased breath sounds present. No wheezing, rhonchi or rales.  Chest:     Chest wall: No deformity or tenderness.  Abdominal:     Palpations: Abdomen is soft.     Tenderness: There is no abdominal tenderness. There is no guarding.  Musculoskeletal:     Right lower leg: No tenderness. No edema.     Left lower leg: No tenderness. No edema.  Neurological:     Mental Status: She is alert and oriented to person, place, and time.  Psychiatric:        Mood and Affect: Mood normal.        Behavior: Behavior normal.     MAU Course  Procedures  MDM Reviewed echo and holter monitor results. Wnl, asked to avoid caffeine by cardiologist. She was prescribed caffeine pills for headache but has not required them in several weeks.  EKG: NSR  Assessment and Plan    Simone Autry-Lott 02/01/2023, 7:07 PM   MDM: Given symptoms and history of Hyperthyroidism and thryroid storm, EKG, and labs were ordered Results for orders placed or performed during the hospital encounter of 02/01/23 (from the past 24 hour(s))  Urinalysis, Routine w reflex microscopic -Urine, Clean Catch     Status: Abnormal   Collection Time: 02/01/23  6:56 PM  Result Value Ref Range   Color, Urine YELLOW YELLOW   APPearance HAZY (A) CLEAR   Specific Gravity, Urine 1.012 1.005 - 1.030   pH 6.0 5.0 - 8.0   Glucose, UA 50 (A) NEGATIVE mg/dL   Hgb urine  dipstick NEGATIVE NEGATIVE   Bilirubin Urine NEGATIVE NEGATIVE   Ketones, ur NEGATIVE NEGATIVE mg/dL   Protein, ur NEGATIVE NEGATIVE mg/dL   Nitrite NEGATIVE NEGATIVE   Leukocytes,Ua TRACE (A) NEGATIVE   RBC / HPF 0-5 0 - 5 RBC/hpf   WBC, UA 0-5 0 -  5 WBC/hpf   Bacteria, UA NONE SEEN NONE SEEN   Squamous Epithelial / HPF 0-5 0 - 5 /HPF   Mucus PRESENT   TSH     Status: Abnormal   Collection Time: 02/01/23  7:50 PM  Result Value Ref Range   TSH <0.010 (L) 0.350 - 4.500 uIU/mL  T4, free     Status: None   Collection Time: 02/01/23  7:50 PM  Result Value Ref Range   Free T4 0.62 0.61 - 1.12 ng/dL  CBC     Status: Abnormal   Collection Time: 02/01/23  7:50 PM  Result Value Ref Range   WBC 8.6 4.0 - 10.5 K/uL   RBC 3.79 (L) 3.87 - 5.11 MIL/uL   Hemoglobin 9.3 (L) 12.0 - 15.0 g/dL   HCT 47.8 (L) 29.5 - 62.1 %   MCV 77.6 (L) 80.0 - 100.0 fL   MCH 24.5 (L) 26.0 - 34.0 pg   MCHC 31.6 30.0 - 36.0 g/dL   RDW 30.8 65.7 - 84.6 %   Platelets 237 150 - 400 K/uL   nRBC 0.0 0.0 - 0.2 %  Basic metabolic panel     Status: Abnormal   Collection Time: 02/01/23  7:50 PM  Result Value Ref Range   Sodium 136 135 - 145 mmol/L   Potassium 3.4 (L) 3.5 - 5.1 mmol/L   Chloride 105 98 - 111 mmol/L   CO2 20 (L) 22 - 32 mmol/L   Glucose, Bld 124 (H) 70 - 99 mg/dL   BUN 7 6 - 20 mg/dL   Creatinine, Ser 9.62 0.44 - 1.00 mg/dL   Calcium 8.7 (L) 8.9 - 10.3 mg/dL   GFR, Estimated >95 >28 mL/min   Anion gap 11 5 - 15   AVSS, HR 80s-90s throughout  FHR reassuring Uterine irritability with closed cervix  Discussed with Dr Despina Hidden He recommends patient return to her original Tapazole dose and then followup in office  A:  SIngle IUP at [redacted]w[redacted]d       Hyperthyroidism       Palpitations       Shortness of breath       Normal EKG  P:   Discharge home       Recommend go back to original dose of Tapazole       Tachycardia/Thyoid storm precautions       PTL precautions       Followup in office       Encouraged to return if she develops worsening of symptoms, increase in pain, fever, or other concerning symptoms.   Aviva Signs, CNM

## 2023-02-01 NOTE — MAU Note (Signed)
.  April Benson is a 27 y.o. at [redacted]w[redacted]d here in MAU reporting: about an hour ago she started having pain her chest, shortness of breath, and feeling like her heart was racing. Has had these symptoms earlier in the pregnancy and it was a thyroid storm. When the symptoms didn't improve she came in to be seen. Reports good fetal movement. Denies bleeding or ROM.   Onset of complaint: 1.5 hours ago Pain score: 4/10 chest Vitals:   02/01/23 1858 02/01/23 1900  BP: 118/70   Pulse: 90   Resp: 17   Temp: 98.4 F (36.9 C)   SpO2: 99% 97%     FHT:123 Lab orders placed from triage:urine

## 2023-02-03 ENCOUNTER — Encounter: Payer: Self-pay | Admitting: Obstetrics & Gynecology

## 2023-02-03 ENCOUNTER — Ambulatory Visit

## 2023-02-03 VITALS — BP 106/72 | HR 93 | Temp 98.6°F | Resp 18 | Ht 63.0 in | Wt 210.4 lb

## 2023-02-03 DIAGNOSIS — D508 Other iron deficiency anemias: Secondary | ICD-10-CM | POA: Diagnosis not present

## 2023-02-03 DIAGNOSIS — O99013 Anemia complicating pregnancy, third trimester: Secondary | ICD-10-CM | POA: Diagnosis not present

## 2023-02-03 DIAGNOSIS — Z3A35 35 weeks gestation of pregnancy: Secondary | ICD-10-CM

## 2023-02-03 LAB — T3: T3, Total: 243 ng/dL — ABNORMAL HIGH (ref 71–180)

## 2023-02-03 LAB — THYROID ANTIBODIES
Thyroglobulin Antibody: 20.9 IU/mL — ABNORMAL HIGH (ref 0.0–0.9)
Thyroperoxidase Ab SerPl-aCnc: 303 IU/mL — ABNORMAL HIGH (ref 0–34)

## 2023-02-03 MED ORDER — ACETAMINOPHEN 325 MG PO TABS
650.0000 mg | ORAL_TABLET | Freq: Once | ORAL | Status: AC
  Administered 2023-02-03: 650 mg via ORAL
  Filled 2023-02-03: qty 2

## 2023-02-03 MED ORDER — IRON SUCROSE 500 MG IVPB - SIMPLE MED
500.0000 mg | Freq: Once | INTRAVENOUS | Status: AC
  Administered 2023-02-03: 500 mg via INTRAVENOUS
  Filled 2023-02-03: qty 500

## 2023-02-03 MED ORDER — DIPHENHYDRAMINE HCL 25 MG PO CAPS
25.0000 mg | ORAL_CAPSULE | Freq: Once | ORAL | Status: AC
  Administered 2023-02-03: 25 mg via ORAL
  Filled 2023-02-03: qty 1

## 2023-02-03 NOTE — Progress Notes (Signed)
Diagnosis: Iron Deficiency Anemia  Provider:  Chilton Greathouse MD  Procedure: IV Infusion  IV Type: Peripheral, IV Location: L Antecubital  Venofer (Iron Sucrose), Dose: 500 mg  Infusion Start Time: 0939  Infusion Stop Time: 1347  Post Infusion IV Care: Observation period completed and Peripheral IV Discontinued  Discharge: Condition: Good, Destination: Home . AVS Declined  Performed by:  Loney Hering, LPN

## 2023-02-05 ENCOUNTER — Ambulatory Visit

## 2023-02-06 ENCOUNTER — Ambulatory Visit: Admitting: *Deleted

## 2023-02-06 ENCOUNTER — Ambulatory Visit (INDEPENDENT_AMBULATORY_CARE_PROVIDER_SITE_OTHER): Admitting: Obstetrics and Gynecology

## 2023-02-06 ENCOUNTER — Ambulatory Visit: Attending: Obstetrics and Gynecology | Admitting: Obstetrics and Gynecology

## 2023-02-06 ENCOUNTER — Other Ambulatory Visit (HOSPITAL_COMMUNITY)
Admission: RE | Admit: 2023-02-06 | Discharge: 2023-02-06 | Disposition: A | Discharge status: Home / Self Care | Source: Ambulatory Visit | Attending: Obstetrics and Gynecology | Admitting: Obstetrics and Gynecology

## 2023-02-06 ENCOUNTER — Ambulatory Visit: Attending: Obstetrics and Gynecology

## 2023-02-06 VITALS — BP 116/58 | HR 84

## 2023-02-06 VITALS — BP 119/76 | HR 86 | Wt 211.0 lb

## 2023-02-06 DIAGNOSIS — O10913 Unspecified pre-existing hypertension complicating pregnancy, third trimester: Secondary | ICD-10-CM | POA: Insufficient documentation

## 2023-02-06 DIAGNOSIS — O10919 Unspecified pre-existing hypertension complicating pregnancy, unspecified trimester: Secondary | ICD-10-CM

## 2023-02-06 DIAGNOSIS — O99283 Endocrine, nutritional and metabolic diseases complicating pregnancy, third trimester: Secondary | ICD-10-CM | POA: Diagnosis not present

## 2023-02-06 DIAGNOSIS — O0993 Supervision of high risk pregnancy, unspecified, third trimester: Secondary | ICD-10-CM

## 2023-02-06 DIAGNOSIS — E0590 Thyrotoxicosis, unspecified without thyrotoxic crisis or storm: Secondary | ICD-10-CM | POA: Diagnosis not present

## 2023-02-06 DIAGNOSIS — O99213 Obesity complicating pregnancy, third trimester: Secondary | ICD-10-CM | POA: Insufficient documentation

## 2023-02-06 DIAGNOSIS — Z8759 Personal history of other complications of pregnancy, childbirth and the puerperium: Secondary | ICD-10-CM

## 2023-02-06 DIAGNOSIS — O10013 Pre-existing essential hypertension complicating pregnancy, third trimester: Secondary | ICD-10-CM | POA: Insufficient documentation

## 2023-02-06 DIAGNOSIS — O09899 Supervision of other high risk pregnancies, unspecified trimester: Secondary | ICD-10-CM

## 2023-02-06 DIAGNOSIS — Z3A36 36 weeks gestation of pregnancy: Secondary | ICD-10-CM

## 2023-02-06 DIAGNOSIS — O09293 Supervision of pregnancy with other poor reproductive or obstetric history, third trimester: Secondary | ICD-10-CM

## 2023-02-06 DIAGNOSIS — E669 Obesity, unspecified: Secondary | ICD-10-CM

## 2023-02-06 DIAGNOSIS — Z2839 Other underimmunization status: Secondary | ICD-10-CM

## 2023-02-06 DIAGNOSIS — O99013 Anemia complicating pregnancy, third trimester: Secondary | ICD-10-CM

## 2023-02-06 NOTE — Progress Notes (Signed)
   PRENATAL VISIT NOTE  Subjective:  April Benson is a 27 y.o. G2P1001 at [redacted]w[redacted]d being seen today for ongoing prenatal care.  She is currently monitored for the following issues for this high-risk pregnancy and has Nausea and vomiting of pregnancy, antepartum; Hyperthyroidism complicating pregnancy; History of gestational hypertension; Supervision of high-risk pregnancy; Rubella non-immune status, antepartum; Thyrotoxicosis with thyrotoxic crisis; Anemia in pregnancy, third trimester; Chronic hypertension during pregnancy, antepartum; and Obesity affecting pregnancy on their problem list.  Patient reports no complaints.  Contractions: Irritability. Vag. Bleeding: None.  Movement: Present. Denies leaking of fluid.   The following portions of the patient's history were reviewed and updated as appropriate: allergies, current medications, past family history, past medical history, past social history, past surgical history and problem list.   Objective:   Vitals:   02/06/23 1451  BP: 119/76  Pulse: 86  Weight: 211 lb (95.7 kg)    Fetal Status: Fetal Heart Rate (bpm): 140   Movement: Present     General:  Alert, oriented and cooperative. Patient is in no acute distress.  Skin: Skin is warm and dry. No rash noted.   Cardiovascular: Normal heart rate noted  Respiratory: Normal respiratory effort, no problems with respiration noted  Abdomen: Soft, gravid, appropriate for gestational age.  Pain/Pressure: Absent      Assessment and Plan:  Pregnancy: G2P1001 at [redacted]w[redacted]d 1. Supervision of high risk pregnancy in third trimester 2. [redacted] weeks gestation of pregnancy SCE closed, performed in presence of chaperone - Culture, beta strep (group b only) - Cervicovaginal ancillary only( [place])  3. Hyperthyroidism affecting pregnancy in third trimester Continue methimazole 50mg  TID BPP today 10/10, normal AFI, cephalic; thyroid circumference >95%ile Growth 6/24 @ 32/4 2081g (51%), AC 81% Planned  for IOL on 7/25 - orders entered before GBS result will need to add GBS ppx if GBS+  4. Chronic hypertension during pregnancy, antepartum 5. History of gestational hypertension Normotensive Appt with Dr. Servando Salina scheduled 7/19  6. Anemia in pregnancy, third trimester Hgb 9.3 on 7/17 S/p IV iron x1  7. Rubella non-immune status, antepartum PP MMR  Future Appointments  Date Time Provider Department Center  02/07/2023  3:20 PM Thomasene Ripple, DO [facility] None  02/13/2023  7:00 AM [facility] SCHED ROOM [facility] None  03/17/2023  1:10 PM Lennart Pall, MD [facility]   Lennart Pall, MD

## 2023-02-06 NOTE — Procedures (Signed)
April Benson 09/25/1995 [redacted]w[redacted]d  Fetus A Non-Stress Test Interpretation for 02/06/23  Indication: Chronic Hypertenstion and hyperthyriod  Fetal Heart Rate A Mode: External Baseline Rate (A): 130 bpm Variability: Moderate Accelerations: 15 x 15 Decelerations: Variable  Uterine Activity Mode: Toco Contraction Frequency (min): none Resting Tone Palpated: Relaxed  Interpretation (Fetal Testing) Nonstress Test Interpretation: Reactive Comments: Tracing reviewed by Dr. Judeth Cornfield

## 2023-02-07 ENCOUNTER — Ambulatory Visit (INDEPENDENT_AMBULATORY_CARE_PROVIDER_SITE_OTHER): Admitting: Cardiology

## 2023-02-07 ENCOUNTER — Encounter: Payer: Self-pay | Admitting: Cardiology

## 2023-02-07 ENCOUNTER — Ambulatory Visit

## 2023-02-07 VITALS — BP 105/69 | HR 78 | Ht 63.0 in | Wt 211.0 lb

## 2023-02-07 DIAGNOSIS — I493 Ventricular premature depolarization: Secondary | ICD-10-CM | POA: Diagnosis not present

## 2023-02-07 LAB — CERVICOVAGINAL ANCILLARY ONLY
Chlamydia: NEGATIVE
Comment: NEGATIVE
Comment: NORMAL
Neisseria Gonorrhea: NEGATIVE

## 2023-02-07 NOTE — Patient Instructions (Addendum)
Medication Instructions:   No changes  *If you need a refill on your cardiac medications before your next appointment, please call your pharmacy*   Lab Work: Not needed    Testing/Procedures:  Not  needed  Follow-Up: At [facility], you and your health needs are our priority.  As part of our continuing mission to provide you with exceptional heart care, we have created designated Provider Care Teams.  These Care Teams include your primary Cardiologist (physician) and Advanced Practice Providers (APPs -  Physician Assistants and Nurse Practitioners) who all work together to provide you with the care you need, when you need it.     Your next appointment:   12 week(s) ( postpartum )  The format for your next appointment:   Virtual Visit   Provider:   Thomasene Ripple, DO

## 2023-02-07 NOTE — Progress Notes (Unsigned)
Cardio-Obstetrics Clinic  New Evaluation  Date:  02/10/2023   ID:  April Benson, DOB 02-Jun-1996, MRN [id number]  PCP:  Abner Greenspan, MD   [facility] Providers Cardiologist:  Thomasene Ripple, DO  Electrophysiologist:  None       Referring MD: Abner Greenspan, MD   Chief Complaint: " I am ok"  History of Present Illness:    April Benson is a 27 y.o. female [G2P1001] who is being seen today for the evaluation of shortness of breath and palpitations at the request of Abner Greenspan, MD.   Medical hx of hypertension, Hyperthyroidism, here today shortness of breath and palpitations. Prior to this visit she had her echo which normal EF with no significant valve abnormalities.   At her last visit due to her thyroid disease we place a zio monitor on the patient.   Currently 36 weeks and 1 day pregnant.  Prior CV Studies Reviewed: The following studies were reviewed today: reviewed Zio monitor and echo   Past Medical History:  Diagnosis Date   Anemia    E. coli UTI (urinary tract infection) 09/25/2022   TOC [ ]     Hypertension    Hyperthyroidism    Nausea and vomiting of pregnancy, antepartum 08/09/2021   Scheduled zofran & reglan q6h & prn PO phenergan qHS  Required twice weekly IV fluids through mid-second trimester in last pregnancy   Tachycardia     Past Surgical History:  Procedure Laterality Date   NO PAST SURGERIES        OB History     Gravida  2   Para  1   Term  1   Preterm      AB      Living  1      SAB      IAB      Ectopic      Multiple  0   Live Births  1               Current Medications: Current Meds  Medication Sig   aspirin EC 81 MG tablet Take 1 tablet (81 mg total) by mouth daily. Start taking when you are [redacted] weeks pregnant for rest of pregnancy for prevention of preeclampsia   cyclobenzaprine (FLEXERIL) 10 MG tablet Take 1 tablet (10 mg total) by mouth 2 (two) times daily as needed for muscle spasms.   Ferric  Maltol 30 MG CAPS Take 1 capsule (30 mg total) by mouth 2 (two) times daily. Please take one hour before breakfast and dinner   folic acid (FOLATE) 400 MCG tablet Take 1 tablet (400 mcg total) by mouth daily.   magnesium oxide (MAG-OX) 400 MG tablet Take 1 tablet (400 mg total) by mouth 2 (two) times daily as needed (headache).   methimazole (TAPAZOLE) 10 MG tablet Take 5 tablets (50 mg total) by mouth 3 (three) times daily.   ondansetron (ZOFRAN-ODT) 8 MG disintegrating tablet Take 1 tablet (8 mg total) by mouth every 8 (eight) hours as needed for nausea or vomiting.   Prenatal Vit-Fe Fumarate-FA (PRENATAL MULTIVITAMIN) TABS tablet Take 1 tablet by mouth daily at 12 noon.   propranolol (INDERAL) 60 MG tablet Take 1 tablet (60 mg total) by mouth 3 (three) times daily.   Current Facility-Administered Medications for the 02/07/23 encounter (Office Visit) with Thomasene Ripple, DO  Medication   promethazine (PHENERGAN) injection 12.5 mg     Allergies:   Patient has no known allergies.   Social  History   Socioeconomic History   Marital status: Married    Spouse name: Not on file   Number of children: Not on file   Years of education: Not on file   Highest education level: Not on file  Occupational History   Not on file  Tobacco Use   Smoking status: Never   Smokeless tobacco: Never  Vaping Use   Vaping status: Never Used  Substance and Sexual Activity   Alcohol use: Not Currently   Drug use: Never   Sexual activity: Not Currently    Partners: Male  Other Topics Concern   Not on file  Social History Narrative   ** Merged History Encounter **       Social Determinants of Health   Financial Resource Strain: Low Risk  (08/21/2022)   Received from [facility], [facility]   Overall Financial Resource Strain (CARDIA)    Difficulty of Paying Living Expenses: Not very hard  Food Insecurity: No Food Insecurity (08/21/2022)   Received from [facility], [facility]   Hunger Vital  Sign    Worried About Running Out of Food in the Last Year: Never true    Ran Out of Food in the Last Year: Never true  Transportation Needs: No Transportation Needs (08/21/2022)   Received from [employer], [facility]   PRAPARE - Transportation    Lack of Transportation (Medical): No    Lack of Transportation (Non-Medical): No  Physical Activity: Sufficiently Active (05/27/2022)   Received from [facility], [facility]   Exercise Vital Sign    Days of Exercise per Week: 5 days    Minutes of Exercise per Session: 30 min  Stress: No Stress Concern Present (08/21/2022)   Received from [facility] of Occupational Health - Occupational Stress Questionnaire    Feeling of Stress : Not at all  Social Connections: Socially Integrated (08/21/2022)   Received from [facility], [facility]   Social Network    How would you rate your social network (family, work, friends)?: Good participation with social networks      Family History  Problem Relation Age of Onset   Hypertension Mother    Diabetes Mother    Hypothyroidism Mother    Luiz Blare' disease Mother       ROS:   Please see the history of present illness.    Palpitations All other systems reviewed and are negative.   Labs/EKG Reviewed:    EKG:   EKG is was not ordered today.     Recent Labs: 09/27/2022: ALT 17 02/01/2023: BUN 7; Creatinine, Ser 0.48; Hemoglobin 9.3; Platelets 237; Potassium 3.4; Sodium 136; TSH <0.010   Recent Lipid Panel No results found for: "CHOL", "TRIG", "HDL", "CHOLHDL", "LDLCALC", "LDLDIRECT"  Physical Exam:    VS:  BP 105/69 (BP Location: Left Arm, Patient Position: Sitting, Cuff Size: Normal)   Pulse 78   Ht 5\' 3"  (1.6 m)   Wt 211 lb (95.7 kg)   LMP 05/30/2022 (Exact Date)   SpO2 97%   BMI 37.38 kg/m     Wt Readings from Last 3 Encounters:  02/07/23 211 lb (95.7 kg)  02/06/23 211 lb (95.7 kg)  02/03/23 210 lb 6.4 oz (95.4 kg)     GEN:  Well  nourished, well developed in no acute distress HEENT: Normal NECK: No JVD; No carotid bruits LYMPHATICS: No lymphadenopathy CARDIAC: RRR, no murmurs, rubs, gallops RESPIRATORY:  Clear to auscultation without rales, wheezing or  rhonchi  ABDOMEN: Soft, non-tender, non-distended MUSCULOSKELETAL:  No edema; No deformity  SKIN: Warm and dry NEUROLOGIC:  Alert and oriented x 3 PSYCHIATRIC:  Normal affect    Risk Assessment/Risk Calculators:                  ASSESSMENT & PLAN:    Rare PVC and PAC  Her zio monitor did show rare symptomatic PAC and PVCs.  No medications at this time - will fu in 12 weeks   Patient Instructions  Medication Instructions:   No changes  *If you need a refill on your cardiac medications before your next appointment, please call your pharmacy*   Lab Work: Not needed    Testing/Procedures:  Not  needed  Follow-Up: At [facility], you and your health needs are our priority.  As part of our continuing mission to provide you with exceptional heart care, we have created designated Provider Care Teams.  These Care Teams include your primary Cardiologist (physician) and Advanced Practice Providers (APPs -  Physician Assistants and Nurse Practitioners) who all work together to provide you with the care you need, when you need it.     Your next appointment:   12 week(s) ( postpartum )  The format for your next appointment:   Virtual Visit   Provider:   Thomasene Ripple, DO       Dispo:  Return in about 12 weeks (around 05/02/2023).   Medication Adjustments/Labs and Tests Ordered: Current medicines are reviewed at length with the patient today.  Concerns regarding medicines are outlined above.  Tests Ordered: No orders of the defined types were placed in this encounter.  Medication Changes: No orders of the defined types were placed in this encounter.

## 2023-02-10 ENCOUNTER — Ambulatory Visit

## 2023-02-10 LAB — CULTURE, BETA STREP (GROUP B ONLY): Strep Gp B Culture: NEGATIVE

## 2023-02-12 ENCOUNTER — Other Ambulatory Visit: Payer: Self-pay | Admitting: Advanced Practice Midwife

## 2023-02-12 ENCOUNTER — Ambulatory Visit

## 2023-02-13 ENCOUNTER — Encounter: Admitting: Obstetrics and Gynecology

## 2023-02-13 ENCOUNTER — Inpatient Hospital Stay (HOSPITAL_COMMUNITY)

## 2023-02-14 ENCOUNTER — Inpatient Hospital Stay (HOSPITAL_COMMUNITY)
Admission: RE | Admit: 2023-02-14 | Discharge: 2023-02-16 | DRG: 807 | Disposition: A | Discharge status: Home / Self Care | Payer: Medicaid Other | Attending: Obstetrics and Gynecology | Admitting: Obstetrics and Gynecology

## 2023-02-14 ENCOUNTER — Encounter (HOSPITAL_COMMUNITY): Payer: Self-pay | Admitting: Obstetrics and Gynecology

## 2023-02-14 ENCOUNTER — Inpatient Hospital Stay (HOSPITAL_COMMUNITY): Admitting: Anesthesiology

## 2023-02-14 ENCOUNTER — Other Ambulatory Visit: Payer: Self-pay

## 2023-02-14 DIAGNOSIS — Z3A37 37 weeks gestation of pregnancy: Secondary | ICD-10-CM

## 2023-02-14 DIAGNOSIS — O99283 Endocrine, nutritional and metabolic diseases complicating pregnancy, third trimester: Principal | ICD-10-CM

## 2023-02-14 DIAGNOSIS — O9902 Anemia complicating childbirth: Secondary | ICD-10-CM | POA: Diagnosis present

## 2023-02-14 DIAGNOSIS — O1002 Pre-existing essential hypertension complicating childbirth: Secondary | ICD-10-CM | POA: Diagnosis not present

## 2023-02-14 DIAGNOSIS — E039 Hypothyroidism, unspecified: Secondary | ICD-10-CM | POA: Diagnosis present

## 2023-02-14 DIAGNOSIS — E0590 Thyrotoxicosis, unspecified without thyrotoxic crisis or storm: Secondary | ICD-10-CM | POA: Diagnosis present

## 2023-02-14 DIAGNOSIS — O99210 Obesity complicating pregnancy, unspecified trimester: Secondary | ICD-10-CM | POA: Diagnosis present

## 2023-02-14 DIAGNOSIS — O99013 Anemia complicating pregnancy, third trimester: Secondary | ICD-10-CM | POA: Diagnosis present

## 2023-02-14 DIAGNOSIS — Z23 Encounter for immunization: Secondary | ICD-10-CM

## 2023-02-14 DIAGNOSIS — Z2839 Other underimmunization status: Secondary | ICD-10-CM

## 2023-02-14 DIAGNOSIS — O09899 Supervision of other high risk pregnancies, unspecified trimester: Secondary | ICD-10-CM

## 2023-02-14 DIAGNOSIS — O99214 Obesity complicating childbirth: Secondary | ICD-10-CM | POA: Diagnosis present

## 2023-02-14 DIAGNOSIS — O99280 Endocrine, nutritional and metabolic diseases complicating pregnancy, unspecified trimester: Secondary | ICD-10-CM | POA: Diagnosis present

## 2023-02-14 DIAGNOSIS — O10919 Unspecified pre-existing hypertension complicating pregnancy, unspecified trimester: Secondary | ICD-10-CM | POA: Diagnosis present

## 2023-02-14 DIAGNOSIS — O99284 Endocrine, nutritional and metabolic diseases complicating childbirth: Secondary | ICD-10-CM | POA: Diagnosis present

## 2023-02-14 DIAGNOSIS — Z8759 Personal history of other complications of pregnancy, childbirth and the puerperium: Secondary | ICD-10-CM

## 2023-02-14 DIAGNOSIS — O0990 Supervision of high risk pregnancy, unspecified, unspecified trimester: Secondary | ICD-10-CM

## 2023-02-14 LAB — TYPE AND SCREEN
ABO/RH(D): A POS
Antibody Screen: NEGATIVE

## 2023-02-14 LAB — CBC
HCT: 32 % — ABNORMAL LOW (ref 36.0–46.0)
Hemoglobin: 10.3 g/dL — ABNORMAL LOW (ref 12.0–15.0)
MCH: 26.1 pg (ref 26.0–34.0)
MCHC: 32.2 g/dL (ref 30.0–36.0)
MCV: 81 fL (ref 80.0–100.0)
Platelets: 243 10*3/uL (ref 150–400)
RBC: 3.95 MIL/uL (ref 3.87–5.11)
RDW: 17.7 % — ABNORMAL HIGH (ref 11.5–15.5)
WBC: 8.4 10*3/uL (ref 4.0–10.5)
nRBC: 0 % (ref 0.0–0.2)

## 2023-02-14 LAB — RPR: RPR Ser Ql: NONREACTIVE

## 2023-02-14 MED ORDER — PHENYLEPHRINE 80 MCG/ML (10ML) SYRINGE FOR IV PUSH (FOR BLOOD PRESSURE SUPPORT): 80.0000 ug | PREFILLED_SYRINGE | INTRAVENOUS | Status: DC | PRN

## 2023-02-14 MED ORDER — LACTATED RINGERS IV SOLN: INTRAVENOUS | Status: DC

## 2023-02-14 MED ORDER — MISOPROSTOL 50MCG HALF TABLET
50.0000 ug | ORAL_TABLET | Freq: Once | ORAL | Status: AC
  Administered 2023-02-14: 50 ug via ORAL
  Filled 2023-02-14: qty 1

## 2023-02-14 MED ORDER — ZOLPIDEM TARTRATE 5 MG PO TABS: 5.0000 mg | ORAL_TABLET | Freq: Every evening | ORAL | Status: DC | PRN

## 2023-02-14 MED ORDER — IBUPROFEN 600 MG PO TABS
600.0000 mg | ORAL_TABLET | Freq: Four times a day (QID) | ORAL | Status: DC
  Administered 2023-02-14 – 2023-02-16 (×8): 600 mg via ORAL
  Filled 2023-02-14 (×8): qty 1

## 2023-02-14 MED ORDER — SOD CITRATE-CITRIC ACID 500-334 MG/5ML PO SOLN: 30.0000 mL | ORAL | Status: DC | PRN

## 2023-02-14 MED ORDER — MAGNESIUM OXIDE 400 MG PO TABS: 400.0000 mg | ORAL_TABLET | Freq: Two times a day (BID) | ORAL | Status: DC | PRN

## 2023-02-14 MED ORDER — TERBUTALINE SULFATE 1 MG/ML IJ SOLN: 0.2500 mg | Freq: Once | INTRAMUSCULAR | Status: DC | PRN

## 2023-02-14 MED ORDER — PRENATAL MULTIVITAMIN CH: 1.0000 | ORAL_TABLET | Freq: Every day | ORAL | Status: DC

## 2023-02-14 MED ORDER — DIPHENHYDRAMINE HCL 50 MG/ML IJ SOLN: 12.5000 mg | INTRAMUSCULAR | Status: DC | PRN

## 2023-02-14 MED ORDER — SENNOSIDES-DOCUSATE SODIUM 8.6-50 MG PO TABS
2.0000 | ORAL_TABLET | Freq: Every day | ORAL | Status: DC
  Administered 2023-02-15 – 2023-02-16 (×2): 2 via ORAL
  Filled 2023-02-14 (×2): qty 2

## 2023-02-14 MED ORDER — FUROSEMIDE 20 MG PO TABS
20.0000 mg | ORAL_TABLET | Freq: Every day | ORAL | Status: DC
  Administered 2023-02-15 – 2023-02-16 (×2): 20 mg via ORAL
  Filled 2023-02-14 (×2): qty 1

## 2023-02-14 MED ORDER — LIDOCAINE-EPINEPHRINE (PF) 2 %-1:200000 IJ SOLN
INTRAMUSCULAR | Status: DC | PRN
  Administered 2023-02-14: 5 mL via EPIDURAL

## 2023-02-14 MED ORDER — OXYTOCIN BOLUS FROM INFUSION
333.0000 mL | Freq: Once | INTRAVENOUS | Status: AC
  Administered 2023-02-14: 333 mL via INTRAVENOUS

## 2023-02-14 MED ORDER — FENTANYL-BUPIVACAINE-NACL 0.5-0.125-0.9 MG/250ML-% EP SOLN
12.0000 mL/h | EPIDURAL | Status: DC | PRN
  Administered 2023-02-14: 12 mL/h via EPIDURAL
  Filled 2023-02-14: qty 250

## 2023-02-14 MED ORDER — EPHEDRINE 5 MG/ML INJ: 10.0000 mg | INTRAVENOUS | Status: DC | PRN

## 2023-02-14 MED ORDER — WITCH HAZEL-GLYCERIN EX PADS: 1.0000 | MEDICATED_PAD | CUTANEOUS | Status: DC | PRN

## 2023-02-14 MED ORDER — SIMETHICONE 80 MG PO CHEW: 80.0000 mg | CHEWABLE_TABLET | ORAL | Status: DC | PRN

## 2023-02-14 MED ORDER — BENZOCAINE-MENTHOL 20-0.5 % EX AERO: 1.0000 | INHALATION_SPRAY | CUTANEOUS | Status: DC | PRN

## 2023-02-14 MED ORDER — ACETAMINOPHEN 325 MG PO TABS: 650.0000 mg | ORAL_TABLET | ORAL | Status: DC | PRN

## 2023-02-14 MED ORDER — MISOPROSTOL 25 MCG QUARTER TABLET
25.0000 ug | ORAL_TABLET | Freq: Once | ORAL | Status: AC
  Administered 2023-02-14: 25 ug via VAGINAL
  Filled 2023-02-14: qty 1

## 2023-02-14 MED ORDER — METHIMAZOLE 10 MG PO TABS
50.0000 mg | ORAL_TABLET | Freq: Three times a day (TID) | ORAL | Status: DC
  Administered 2023-02-14 – 2023-02-15 (×5): 50 mg via ORAL
  Filled 2023-02-14 (×11): qty 5

## 2023-02-14 MED ORDER — OXYCODONE-ACETAMINOPHEN 5-325 MG PO TABS: 1.0000 | ORAL_TABLET | ORAL | Status: DC | PRN

## 2023-02-14 MED ORDER — FERROUS SULFATE 325 (65 FE) MG PO TABS
325.0000 mg | ORAL_TABLET | Freq: Two times a day (BID) | ORAL | Status: DC
  Administered 2023-02-15 – 2023-02-16 (×3): 325 mg via ORAL
  Filled 2023-02-14 (×3): qty 1

## 2023-02-14 MED ORDER — PRENATAL MULTIVITAMIN CH
1.0000 | ORAL_TABLET | Freq: Every day | ORAL | Status: DC
  Administered 2023-02-15 – 2023-02-16 (×2): 1 via ORAL
  Filled 2023-02-14 (×2): qty 1

## 2023-02-14 MED ORDER — ERYTHROMYCIN 5 MG/GM OP OINT
TOPICAL_OINTMENT | OPHTHALMIC | Status: AC
  Administered 2023-02-14: 1
  Filled 2023-02-14: qty 1

## 2023-02-14 MED ORDER — PROPRANOLOL HCL 60 MG PO TABS
60.0000 mg | ORAL_TABLET | Freq: Three times a day (TID) | ORAL | Status: DC
  Administered 2023-02-14 – 2023-02-15 (×3): 60 mg via ORAL
  Filled 2023-02-14 (×7): qty 1

## 2023-02-14 MED ORDER — LIDOCAINE HCL (PF) 1 % IJ SOLN: 30.0000 mL | INTRAMUSCULAR | Status: DC | PRN

## 2023-02-14 MED ORDER — FENTANYL CITRATE (PF) 100 MCG/2ML IJ SOLN: 50.0000 ug | INTRAMUSCULAR | Status: DC | PRN

## 2023-02-14 MED ORDER — OXYTOCIN-SODIUM CHLORIDE 30-0.9 UT/500ML-% IV SOLN
1.0000 m[IU]/min | INTRAVENOUS | Status: DC
  Administered 2023-02-14: 2 m[IU]/min via INTRAVENOUS
  Filled 2023-02-14: qty 500

## 2023-02-14 MED ORDER — DIBUCAINE (PERIANAL) 1 % EX OINT: 1.0000 | TOPICAL_OINTMENT | CUTANEOUS | Status: DC | PRN

## 2023-02-14 MED ORDER — OXYCODONE-ACETAMINOPHEN 5-325 MG PO TABS: 2.0000 | ORAL_TABLET | ORAL | Status: DC | PRN

## 2023-02-14 MED ORDER — ONDANSETRON HCL 4 MG/2ML IJ SOLN: 4.0000 mg | Freq: Four times a day (QID) | INTRAMUSCULAR | Status: DC | PRN

## 2023-02-14 MED ORDER — OXYTOCIN-SODIUM CHLORIDE 30-0.9 UT/500ML-% IV SOLN: 2.5000 [IU]/h | INTRAVENOUS | Status: DC

## 2023-02-14 MED ORDER — DIPHENHYDRAMINE HCL 25 MG PO CAPS: 25.0000 mg | ORAL_CAPSULE | Freq: Four times a day (QID) | ORAL | Status: DC | PRN

## 2023-02-14 MED ORDER — BUPIVACAINE HCL (PF) 0.25 % IJ SOLN
INTRAMUSCULAR | Status: DC | PRN
  Administered 2023-02-14: 10 mL via EPIDURAL

## 2023-02-14 MED ORDER — COCONUT OIL OIL: 1.0000 | TOPICAL_OIL | Status: DC | PRN

## 2023-02-14 MED ORDER — MEASLES, MUMPS & RUBELLA VAC IJ SOLR
0.5000 mL | Freq: Once | INTRAMUSCULAR | Status: AC
  Administered 2023-02-16: 0.5 mL via SUBCUTANEOUS
  Filled 2023-02-14: qty 0.5

## 2023-02-14 MED ORDER — ONDANSETRON HCL 4 MG PO TABS: 4.0000 mg | ORAL_TABLET | ORAL | Status: DC | PRN

## 2023-02-14 MED ORDER — LACTATED RINGERS IV SOLN
500.0000 mL | Freq: Once | INTRAVENOUS | Status: AC
  Administered 2023-02-14: 500 mL via INTRAVENOUS

## 2023-02-14 MED ORDER — LACTATED RINGERS IV SOLN
500.0000 mL | INTRAVENOUS | Status: DC | PRN
  Administered 2023-02-14: 1000 mL via INTRAVENOUS
  Administered 2023-02-14: 500 mL via INTRAVENOUS

## 2023-02-14 MED ORDER — ONDANSETRON HCL 4 MG/2ML IJ SOLN: 4.0000 mg | INTRAMUSCULAR | Status: DC | PRN

## 2023-02-14 NOTE — Discharge Summary (Signed)
Postpartum Discharge Summary  Date of Service updated***     Patient Name: April Benson DOB: 07-13-1996 MRN: [id number]  Date of admission: 02/14/2023 Delivery date:02/14/2023 Delivering provider: Lennart Pall Date of discharge: 02/14/2023  Admitting diagnosis: Encounter for induction of labor [Z34.90] Intrauterine pregnancy: [redacted]w[redacted]d     Secondary diagnosis:  Active Problems:   Hyperthyroidism complicating pregnancy   History of gestational hypertension   Supervision of high-risk pregnancy   Rubella non-immune status, antepartum   Anemia in pregnancy, third trimester   Chronic hypertension during pregnancy, antepartum   Obesity affecting pregnancy  Additional problems: ***    Discharge diagnosis: Term Pregnancy Delivered                                              Post partum procedures:{Postpartum procedures:23558} Augmentation: AROM, Pitocin, Cytotec, and IP Foley Complications: None  Hospital course: Induction of Labor With Vaginal Delivery   27 y.o. yo G2P2002 at [redacted]w[redacted]d was admitted to the hospital 02/14/2023 for induction of labor.  Indication for induction:  hyperthyroidism .  Patient had an uncomplicated labor course. Membrane Rupture Time/Date: 11:10 AM,02/14/2023  Delivery Method:Vaginal, Spontaneous Episiotomy: None Lacerations:  1st degree;Labial Details of delivery can be found in separate delivery note.  Patient had a postpartum course complicated by***. Patient is discharged home 02/14/23.  Newborn Data: Birth date:02/14/2023 Birth time:2:42 PM Gender:Female Living status:Living Apgars:8 ,9  Weight:2770 g  Magnesium Sulfate received: {Mag received:30440022} BMZ received: No Rhophylac:N/A MMR:{MMR:30440033} - ordered T-DaP:Given prenatally Flu: N/A Transfusion:{Transfusion received:30440034}  Physical exam  Vitals:   02/14/23 1600 02/14/23 1615 02/14/23 1740 02/14/23 1845  BP: 120/71 98/60 120/76 102/67  Pulse: (!) 122 92 (!) 101 100   Resp:   16 17  Temp:   98.2 F (36.8 C) 98.4 F (36.9 C)  TempSrc:   Oral Oral  SpO2:   99%   Weight:      Height:       General: {Exam; general:21111117} Lochia: {Desc; appropriate/inappropriate:30686::"appropriate"} Uterine Fundus: {Desc; firm/soft:30687} Incision: {Exam; incision:21111123} DVT Evaluation: {Exam; dvt:2111122} Labs: Lab Results  Component Value Date   WBC 8.4 02/14/2023   HGB 10.3 (L) 02/14/2023   HCT 32.0 (L) 02/14/2023   MCV 81.0 02/14/2023   PLT 243 02/14/2023      Latest Ref Rng & Units 02/01/2023    7:50 PM  CMP  Glucose 70 - 99 mg/dL 782   BUN 6 - 20 mg/dL 7   Creatinine 9.56 - 2.13 mg/dL 0.86   Sodium 578 - 469 mmol/L 136   Potassium 3.5 - 5.1 mmol/L 3.4   Chloride 98 - 111 mmol/L 105   CO2 22 - 32 mmol/L 20   Calcium 8.9 - 10.3 mg/dL 8.7    Edinburgh Score:    02/14/2023    5:40 PM  Edinburgh Postnatal Depression Scale Screening Tool  I have been able to laugh and see the funny side of things. 0  I have looked forward with enjoyment to things. 0  I have blamed myself unnecessarily when things went wrong. 0  I have been anxious or worried for no good reason. 0  I have felt scared or panicky for no good reason. 0  Things have been getting on top of me. 0  I have been so unhappy that I have had difficulty sleeping. 0  I have  felt sad or miserable. 0  I have been so unhappy that I have been crying. 0  The thought of harming myself has occurred to me. 0  Edinburgh Postnatal Depression Scale Total 0     After visit meds:  Allergies as of 02/14/2023   No Known Allergies   Med Rec must be completed prior to using this [facility]***        Discharge home in stable condition Infant Feeding: Bottle Infant Disposition:{CHL IP OB HOME WITH ZOXWRU:04540} Discharge instruction: per After Visit Summary and Postpartum booklet. Activity: Advance as tolerated. Pelvic rest for 6 weeks.  Diet: routine diet Future Appointments: Future  Appointments  Date Time Provider Department Center  03/17/2023  1:10 PM Lennart Pall, MD [facility]  05/09/2023  1:40 PM Tobb, Lavona Mound, DO [facility] None   Follow up Visit:  Message sent by [facility] 7/26 Please schedule this patient for a In person postpartum visit in 6 weeks with the following provider: MD. Additional Postpartum F/U:BP check 1 week  High risk pregnancy complicated by: HTN and difficult to control hyperthyroidism Delivery mode:  Vaginal, Spontaneous Anticipated Birth Control:   partner plans vasectomy   02/14/2023 Lennart Pall, MD

## 2023-02-14 NOTE — H&P (Signed)
OBSTETRIC ADMISSION HISTORY AND PHYSICAL  April Benson is a 27 y.o. female G2P1001 with IUP at [redacted]w[redacted]d by zlmp presenting for IOL for hyperthyroidism. She reports +FMs, No LOF, no VB, no blurry vision, headaches or peripheral edema, and RUQ pain.  She plans on formula  feeding. She request vasectomy for birth control. She received her prenatal care at  [facility]    Dating: By LMP --->  Estimated Date of Delivery: 03/06/23  Sono:    @[redacted]w[redacted]d , CWD, normal anatomy, cephalic presentation,  posterior, 2081 g, 51% EFW   Prenatal History/Complications: Hypothyroidism affecting pregnancy, chronic hypertension affecting pregnancy, history of gestational hypertension, rubella nonimmune  Past Medical History: Past Medical History:  Diagnosis Date   Anemia    E. coli UTI (urinary tract infection) 09/25/2022   TOC [ ]     Hypertension    Hyperthyroidism    Nausea and vomiting of pregnancy, antepartum 08/09/2021   Scheduled zofran & reglan q6h & prn PO phenergan qHS  Required twice weekly IV fluids through mid-second trimester in last pregnancy   Tachycardia     Past Surgical History: Past Surgical History:  Procedure Laterality Date   NO PAST SURGERIES      Obstetrical History: OB History     Gravida  2   Para  1   Term  1   Preterm      AB      Living  1      SAB      IAB      Ectopic      Multiple  0   Live Births  1           Social History Social History   Socioeconomic History   Marital status: Married    Spouse name: Not on file   Number of children: Not on file   Years of education: Not on file   Highest education level: Not on file  Occupational History   Not on file  Tobacco Use   Smoking status: Never   Smokeless tobacco: Never  Vaping Use   Vaping status: Never Used  Substance and Sexual Activity   Alcohol use: Not Currently   Drug use: Never   Sexual activity: Not Currently    Partners: Male  Other Topics Concern   Not on file  Social  History Narrative   ** Merged History Encounter **       Social Determinants of Health   Financial Resource Strain: Low Risk  (08/21/2022)   Received from [employer], [facility]   Overall Financial Resource Strain (CARDIA)    Difficulty of Paying Living Expenses: Not very hard  Food Insecurity: No Food Insecurity (08/21/2022)   Received from [facility], [facility]   Hunger Vital Sign    Worried About Running Out of Food in the Last Year: Never true    Ran Out of Food in the Last Year: Never true  Transportation Needs: No Transportation Needs (08/21/2022)   Received from [employer], [facility]   PRAPARE - Transportation    Lack of Transportation (Medical): No    Lack of Transportation (Non-Medical): No  Physical Activity: Sufficiently Active (05/27/2022)   Received from [facility], [facility]   Exercise Vital Sign    Days of Exercise per Week: 5 days    Minutes of Exercise per Session: 30 min  Stress: No Stress Concern Present (08/21/2022)   Received from [facility] of Occupational  Health - Occupational Stress Questionnaire    Feeling of Stress : Not at all  Social Connections: Socially Integrated (08/21/2022)   Received from [facility], [facility]   Social Network    How would you rate your social network (family, work, friends)?: Good participation with social networks    Family History: Family History  Problem Relation Age of Onset   Hypertension Mother    Diabetes Mother    Hypothyroidism Mother    Luiz Blare' disease Mother     Allergies: No Known Allergies  Facility-Administered Medications Prior to Admission  Medication Dose Route Frequency Provider Last Rate Last Admin   promethazine (PHENERGAN) injection 12.5 mg  12.5 mg Intravenous Once Lesly Dukes, MD       Medications Prior to Admission  Medication Sig Dispense Refill Last Dose   aspirin EC 81 MG tablet Take 1 tablet (81 mg total) by mouth  daily. Start taking when you are [redacted] weeks pregnant for rest of pregnancy for prevention of preeclampsia 300 tablet 2    cyclobenzaprine (FLEXERIL) 10 MG tablet Take 1 tablet (10 mg total) by mouth 2 (two) times daily as needed for muscle spasms. 20 tablet 0    Ferric Maltol 30 MG CAPS Take 1 capsule (30 mg total) by mouth 2 (two) times daily. Please take one hour before breakfast and dinner 60 capsule 2    folic acid (FOLATE) 400 MCG tablet Take 1 tablet (400 mcg total) by mouth daily. 90 tablet 3    magnesium oxide (MAG-OX) 400 MG tablet Take 1 tablet (400 mg total) by mouth 2 (two) times daily as needed (headache). 30 tablet 2    methimazole (TAPAZOLE) 10 MG tablet Take 5 tablets (50 mg total) by mouth 3 (three) times daily. 450 tablet 0    ondansetron (ZOFRAN-ODT) 8 MG disintegrating tablet Take 1 tablet (8 mg total) by mouth every 8 (eight) hours as needed for nausea or vomiting. 90 tablet 3    Prenatal Vit-Fe Fumarate-FA (PRENATAL MULTIVITAMIN) TABS tablet Take 1 tablet by mouth daily at 12 noon.      propranolol (INDERAL) 60 MG tablet Take 1 tablet (60 mg total) by mouth 3 (three) times daily. 270 tablet 3      Review of Systems   All systems reviewed and negative except as stated in HPI  Last menstrual period 05/30/2022, unknown if currently breastfeeding. General appearance: alert, cooperative, and appears stated age Lungs: normal work of breathing  Heart: regular rate  Abdomen: soft, non-tender; bowel sounds normal Presentation: cephalic Fetal monitoringBaseline: 130 bpm, Variability: Good {> 6 bpm), Accelerations: Reactive, and Decelerations: Absent Uterine activityNone     Prenatal labs: ABO, Rh: A/Positive/-- (01/22 1158) Antibody: Negative (01/22 1158) Rubella: <0.90 (01/22 1158) RPR: Non Reactive (05/16 0842)  HBsAg: Negative (01/22 1158)  HIV: Non Reactive (05/16 0842)  GBS: Negative/-- (07/18 1456)  1 hr Glucola negative Genetic screening low risk Anatomy US  normal  Prenatal Transfer Tool  Maternal Diabetes: No Genetic Screening: Normal Maternal Ultrasounds/Referrals: Normal Fetal Ultrasounds or other Referrals:  Referred to Materal Fetal Medicine  Maternal Substance Abuse:  No Significant Maternal Medications:  Meds include: Other: Methimazole Significant Maternal Lab Results:  Group B Strep negative Number of Prenatal Visits:greater than 3 verified prenatal visits Other Comments:  None  No results found for this or any previous visit (from the past 24 hour(s)).  Patient Active Problem List   Diagnosis Date Noted   Chronic hypertension during pregnancy, antepartum 01/30/2023  Obesity affecting pregnancy 01/30/2023   Anemia in pregnancy, third trimester 12/06/2022   Rubella non-immune status, antepartum 08/14/2022   Thyrotoxicosis with thyrotoxic crisis 08/14/2022   Supervision of high-risk pregnancy 08/12/2022   History of gestational hypertension 01/19/2022   Hyperthyroidism complicating pregnancy 01/18/2022   Nausea and vomiting of pregnancy, antepartum 08/09/2021    Assessment/Plan:  Joice Lofts M Kerekes is a 27 y.o. G2P1001 at [redacted]w[redacted]d here for IOL for hypothyroidism  #Labor:Induction started with FB placement and cytotec 50/25 #Pain: Per patient request #FWB: Category 1 #ID:  GBS negative #MOF: Formula #MOC: Breast # Hypothyroidism: Patient on methimazole 150 mg/day and has increased TRAb levels.  Spoke with NICU who recommended sending thyroid function test from the cord blood and making sure the pediatricians are aware.  Celedonio Savage, MD  02/14/2023, 2:14 AM

## 2023-02-14 NOTE — Anesthesia Procedure Notes (Signed)
Epidural Patient location during procedure: OB Start time: 02/14/2023 9:05 AM End time: 02/14/2023 9:15 AM  Staffing Anesthesiologist: Elmer Picker, MD Performed: anesthesiologist   Preanesthetic Checklist Completed: patient identified, IV checked, risks and benefits discussed, monitors and equipment checked, pre-op evaluation and timeout performed  Epidural Patient position: sitting Prep: DuraPrep and site prepped and draped Patient monitoring: continuous pulse ox, blood pressure, heart rate and cardiac monitor Approach: midline Location: L3-L4 Injection technique: LOR air  Needle:  Needle type: Tuohy  Needle gauge: 17 G Needle length: 9 cm Needle insertion depth: 7 cm Catheter type: closed end flexible Catheter size: 19 Gauge Catheter at skin depth: 12 cm Test dose: negative  Assessment Sensory level: T8 Events: blood not aspirated, no cerebrospinal fluid, injection not painful, no injection resistance, no paresthesia and negative IV test  Additional Notes Patient identified. Risks/Benefits/Options discussed with patient including but not limited to bleeding, infection, nerve damage, paralysis, failed block, incomplete pain control, headache, blood pressure changes, nausea, vomiting, reactions to medication both or allergic, itching and postpartum back pain. Confirmed with bedside nurse the patient's most recent platelet count. Confirmed with patient that they are not currently taking any anticoagulation, have any bleeding history or any family history of bleeding disorders. Patient expressed understanding and wished to proceed. All questions were answered. Sterile technique was used throughout the entire procedure. Please see nursing notes for vital signs. Test dose was given through epidural catheter and negative prior to continuing to dose epidural or start infusion. Warning signs of high block given to the patient including shortness of breath, tingling/numbness in hands,  complete motor block, or any concerning symptoms with instructions to call for help. Patient was given instructions on fall risk and not to get out of bed. All questions and concerns addressed with instructions to call with any issues or inadequate analgesia.  Reason for block:procedure for pain

## 2023-02-14 NOTE — Anesthesia Preprocedure Evaluation (Addendum)
Anesthesia Evaluation  Patient identified by MRN, date of birth, ID band Patient awake    Reviewed: Allergy & Precautions, NPO status , Patient's Chart, lab work & pertinent test results, reviewed documented beta blocker date and time   Airway Mallampati: II  TM Distance: >3 FB Neck ROM: Full    Dental no notable dental hx.    Pulmonary neg pulmonary ROS   Pulmonary exam normal breath sounds clear to auscultation       Cardiovascular hypertension, Pt. on medications and Pt. on home beta blockers Normal cardiovascular exam Rhythm:Regular Rate:Normal     Neuro/Psych negative neurological ROS  negative psych ROS   GI/Hepatic negative GI ROS, Neg liver ROS,,,  Endo/Other   Hyperthyroidism   Renal/GU negative Renal ROS  negative genitourinary   Musculoskeletal negative musculoskeletal ROS (+)    Abdominal  (+) + obese  Peds  Hematology  (+) Blood dyscrasia (IV iron infusions), anemia   Anesthesia Other Findings IOL for hyperthyroidism  Reproductive/Obstetrics (+) Pregnancy                             Anesthesia Physical Anesthesia Plan  ASA: 3  Anesthesia Plan: Epidural   Post-op Pain Management:    Induction:   PONV Risk Score and Plan: Treatment may vary due to age or medical condition  Airway Management Planned: Natural Airway  Additional Equipment:   Intra-op Plan:   Post-operative Plan:   Informed Consent: I have reviewed the patients History and Physical, chart, labs and discussed the procedure including the risks, benefits and alternatives for the proposed anesthesia with the patient or authorized representative who has indicated his/her understanding and acceptance.       Plan Discussed with: Anesthesiologist  Anesthesia Plan Comments: (Patient identified. Risks, benefits, options discussed with patient including but not limited to bleeding, infection, nerve damage,  paralysis, failed block, incomplete pain control, headache, blood pressure changes, nausea, vomiting, reactions to medication, itching, and post partum back pain. Confirmed with bedside nurse the patient's most recent platelet count. Confirmed with the patient that they are not taking any anticoagulation, have any bleeding history or any family history of bleeding disorders. Patient expressed understanding and wishes to proceed. All questions were answered. )       Anesthesia Quick Evaluation

## 2023-02-14 NOTE — Progress Notes (Signed)
Labor Progress Note  Delayed entry due to patient care.  In to see patient for exam at 11a. Comfortable with epidural  BP 110/60   Pulse 91   Temp 97.8 F (36.6 C) (Oral)   Resp 18   Ht 5\' 3"  (1.6 m)   Wt 96.5 kg   LMP 05/30/2022 (Exact Date)   SpO2 98%   BMI 37.70 kg/m  SCE 4.5/60/-3 FHT 120bpm, moderate variability, discontinuous so difficult to interpret but no apparent accels or decels Toco not tracing well  AROM for thin mec Pit @ 6  RN working to optimize EFM. Continue IOL, anticipate SVD  Harvie Bridge, MD Obstetrician & Gynecologist, [facility] for [facility], [facility]

## 2023-02-15 ENCOUNTER — Other Ambulatory Visit (HOSPITAL_COMMUNITY): Payer: Self-pay

## 2023-02-15 ENCOUNTER — Encounter: Payer: Self-pay | Admitting: Obstetrics & Gynecology

## 2023-02-15 MED ORDER — FERROUS SULFATE 325 (65 FE) MG PO TABS
325.0000 mg | ORAL_TABLET | ORAL | 3 refills | Status: AC
  Filled 2023-02-15: qty 30, 60d supply, fill #0

## 2023-02-15 MED ORDER — FUROSEMIDE 20 MG PO TABS
20.0000 mg | ORAL_TABLET | Freq: Every day | ORAL | 0 refills | Status: DC
  Filled 2023-02-15: qty 5, 5d supply, fill #0

## 2023-02-15 MED ORDER — IBUPROFEN 600 MG PO TABS
600.0000 mg | ORAL_TABLET | Freq: Four times a day (QID) | ORAL | 0 refills | Status: DC
  Filled 2023-02-15: qty 30, 8d supply, fill #0

## 2023-02-15 MED ORDER — PROPRANOLOL HCL 40 MG PO TABS
40.0000 mg | ORAL_TABLET | Freq: Three times a day (TID) | ORAL | Status: DC
  Administered 2023-02-15 – 2023-02-16 (×3): 40 mg via ORAL
  Filled 2023-02-15 (×6): qty 1

## 2023-02-15 MED ORDER — ACETAMINOPHEN 325 MG PO TABS
650.0000 mg | ORAL_TABLET | ORAL | 0 refills | Status: DC | PRN
  Filled 2023-02-15: qty 100, 9d supply, fill #0

## 2023-02-15 MED ORDER — PROPRANOLOL HCL 20 MG PO TABS
20.0000 mg | ORAL_TABLET | Freq: Three times a day (TID) | ORAL | Status: DC
  Administered 2023-02-15 – 2023-02-16 (×3): 20 mg via ORAL
  Filled 2023-02-15 (×6): qty 1

## 2023-02-15 NOTE — Progress Notes (Signed)
POSTPARTUM PROGRESS NOTE  Post Partum Day 1  Subjective:  April Benson is a 27 y.o. [id number] s/p SVD at [redacted]w[redacted]d.  She reports she is doing well. No acute events overnight. She denies any problems with ambulating, voiding or po intake. Denies nausea or vomiting.  Pain is well controlled.  Lochia is appropriate.  Objective: Blood pressure 98/60, pulse 79, temperature 97.9 F (36.6 C), temperature source Oral, resp. rate 18, height 5\' 3"  (1.6 m), weight 96.5 kg, last menstrual period 05/30/2022, SpO2 100%, unknown if currently breastfeeding.  Physical Exam:  General: alert, cooperative and no distress Chest: no respiratory distress Heart:regular rate, distal pulses intact Abdomen: soft, nontender,  Uterine Fundus: firm, appropriately tender DVT Evaluation: No calf swelling or tenderness Extremities: Trace edema Skin: warm, dry  Recent Labs    02/14/23 0226  HGB 10.3*  HCT 32.0*    Assessment/Plan: April Benson is a 27 y.o. [id number] s/p SVD at [redacted]w[redacted]d   PPD#1 - Doing well  Routine postpartum care  Hypothyroidism: On methimazole, continue current dose Anemia: On oral iron Contraception: Vasectomy  Feeding: Breast  Dispo: Plan for discharge tomorrow.   LOS: 1 day   Derrel Nip, MD  OB Fellow  02/15/2023, 11:55 AM

## 2023-02-15 NOTE — Anesthesia Postprocedure Evaluation (Signed)
Anesthesia Post Note  Patient: April Benson  Procedure(s) Performed: AN AD HOC LABOR EPIDURAL     Patient location during evaluation: Mother Baby Anesthesia Type: Epidural Level of consciousness: awake Pain management: satisfactory to patient Vital Signs Assessment: post-procedure vital signs reviewed and stable Respiratory status: spontaneous breathing Cardiovascular status: stable Anesthetic complications: no  No notable events documented.  Last Vitals:  Vitals:   02/15/23 0330 02/15/23 0743  BP: 100/61 98/60  Pulse: 80 79  Resp: 16 18  Temp: 36.5 C 36.6 C  SpO2: 99% 100%    Last Pain:  Vitals:   02/15/23 0743  TempSrc: Oral  PainSc: 3    Pain Goal:                   Cephus Shelling

## 2023-02-16 NOTE — Progress Notes (Signed)
MD placed order to enroll patient in [employer] postpartum hypertension program. Patient states she has a blood pressure cuff. Offered patient the BP cuff provided by hospital; however, patient declined, stating she can use her own and will document. Placed orders and patient set up on her phone and entered her first blood pressure into app. Blood pressure entered by patient is visible in episodes of care. Earl Gala, Linda [place]

## 2023-02-17 ENCOUNTER — Ambulatory Visit

## 2023-02-20 ENCOUNTER — Encounter: Admitting: Obstetrics and Gynecology

## 2023-02-21 ENCOUNTER — Telehealth: Payer: Self-pay | Admitting: *Deleted

## 2023-02-21 ENCOUNTER — Ambulatory Visit

## 2023-02-21 NOTE — Telephone Encounter (Cosign Needed)
Pt was scheduled for a 1 week BP check.  The BRX BP's that have been received this week are all well within normal limits.  Today's BP is 108/72.  Pt has no other c/o's and will cancel her nurse visit appt. Today.  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

## 2023-02-24 ENCOUNTER — Ambulatory Visit

## 2023-03-17 ENCOUNTER — Ambulatory Visit (INDEPENDENT_AMBULATORY_CARE_PROVIDER_SITE_OTHER): Admitting: Obstetrics and Gynecology

## 2023-03-17 ENCOUNTER — Encounter: Payer: Self-pay | Admitting: Obstetrics and Gynecology

## 2023-03-17 DIAGNOSIS — E0590 Thyrotoxicosis, unspecified without thyrotoxic crisis or storm: Secondary | ICD-10-CM | POA: Diagnosis not present

## 2023-03-17 MED ORDER — SLYND 4 MG PO TABS: 1.0000 | ORAL_TABLET | Freq: Every day | ORAL | 3 refills | Status: DC

## 2023-03-17 NOTE — Progress Notes (Signed)
Post Partum Visit Note  April Benson is a 27 y.o. G41P2002 female who presents for a postpartum visit. She is 4 weeks postpartum following a normal spontaneous vaginal delivery.  I have fully reviewed the prenatal and intrapartum course. The delivery was at 37.1 gestational weeks.  Anesthesia: epidural. Postpartum course has been unremarkable. Baby is doing well. Baby is feeding by bottle - Similac Advance. Bleeding no bleeding. Bowel function is normal. Bladder function is normal. Patient is not sexually active. Contraception method is  undecided- Spouse planning vasectomy . Postpartum depression screening: negative.  Reports she is doing well overall. Feeling well from a thyroid standpoint on methimazole 50 TID & propranolol, sees endo next week and will discuss dose changes/RAI then. Her baby has very minimal thyroid hormone production. She is seeing her pediatrician this afternoon to discuss next steps.    Upstream - 03/17/23 1335       Pregnancy Intention Screening   Does the patient want to become pregnant in the next year? No    Does the patient's partner want to become pregnant in the next year? No    Would the patient like to discuss contraceptive options today? Yes      Contraception Wrap Up   Current Method No Contraceptive Precautions    End Method Oral Contraceptive    Contraception Counseling Provided Yes    How was the end contraceptive method provided? Prescription            The pregnancy intention screening data noted above was reviewed. Potential methods of contraception were discussed. The patient elected to proceed with Oral Contraceptive.   Edinburgh Postnatal Depression Scale - 03/17/23 1317       Edinburgh Postnatal Depression Scale:  In the Past 7 Days   I have been able to laugh and see the funny side of things. 0    I have looked forward with enjoyment to things. 0    I have blamed myself unnecessarily when things went wrong. 0    I have been  anxious or worried for no good reason. 2    I have felt scared or panicky for no good reason. 0    Things have been getting on top of me. 0    I have been so unhappy that I have had difficulty sleeping. 0    I have felt sad or miserable. 0    I have been so unhappy that I have been crying. 0    The thought of harming myself has occurred to me. 0    Edinburgh Postnatal Depression Scale Total 2             Health Maintenance Due  Topic Date Due   INFLUENZA VACCINE  02/20/2023    The following portions of the patient's history were reviewed and updated as appropriate: allergies, current medications, past family history, past medical history, past social history, past surgical history, and problem list.  Review of Systems Pertinent items are noted in HPI.  Objective:  BP 133/84   Pulse (!) 102   Ht 5\' 3"  (1.6 m)   Wt 204 lb (92.5 kg)   LMP 05/30/2022 (Exact Date)   Breastfeeding No   BMI 36.14 kg/m    General:  alert, cooperative, and no distress   Breasts:  not indicated  Lungs: Normal respiratory effort  Heart:  Mild tachycardia  Abdomen: soft    GU exam:   Deferred, no GU concerns today  Assessment:   Postpartum exam Recovering well, no concerns -     Drospirenone (SLYND) 4 MG TABS; Take 1 tablet (4 mg total) by mouth daily.  Hyperthyroidism - Discussed importance of good contraception and role for RAI for control of her Grave's disease to prevent complications with subsequent pregnancies - Partner plans vasectomy, but he won't be scheduled for 5-6 months. Discussed contraceptive options and pt opted for POPs (no estrogen iso migraine w/ aura). Recommended combining with condoms or withdrawal method to increase contraceptive efficacy - Has f/u with Endo next week  Plan:   Essential components of care per ACOG recommendations:  1.  Mood and well being: Patient with negative depression screening today. Reviewed local resources for support.  - Patient  tobacco use? No.   - hx of drug use? No.    2. Infant care and feeding:  -Patient currently breastmilk feeding? No.  -Social determinants of health (SDOH) reviewed in EPIC. No concerns  3. Sexuality, contraception and birth spacing - Patient does not want a pregnancy in the next year.  Desired family size is 2 children.  - Reviewed reproductive life planning. Reviewed contraceptive methods based on pt preferences and effectiveness.  Patient desired Oral Contraceptive and Vasectomy today.   - Discussed birth spacing of 18 months  4. Sleep and fatigue -Encouraged family/partner/community support of 4 hrs of uninterrupted sleep to help with mood and fatigue  5. Physical Recovery  - Discussed patients delivery and complications. She describes her labor as good. - Patient had a Vaginal, no problems at delivery. Patient had a 2nd degree laceration. Perineal healing reviewed. Patient expressed understanding - Patient has urinary incontinence? No. - Patient is safe to resume physical and sexual activity  6.  Health Maintenance - HM due items addressed Yes - Last pap smear  Diagnosis  Date Value Ref Range Status  08/12/2022   Final   - Negative for intraepithelial lesion or malignancy (NILM)  -Breast Cancer screening indicated? No.   7. Chronic Disease/Pregnancy Condition follow up: Hyperthyroidism - PCP & endocrinology follow up as outlined above  Lennart Pall, MD Center for [employer], [facility]

## 2023-04-30 IMAGING — US US OB COMP LESS 14 WK
1 series · 15 of 28 positions shown · non-contrast
Comparison: None.

CLINICAL DATA: First trimester pregnancy.  Hyperthyroidism.

EXAM:
OBSTETRIC <14 WK ULTRASOUND
TECHNIQUE: Transabdominal ultrasound was performed for evaluation of the
gestation as well as the maternal uterus and adnexal regions.

[Series 1: us ob comp less 14 wk · 15 of 50 slices shown]
[im 1/50]
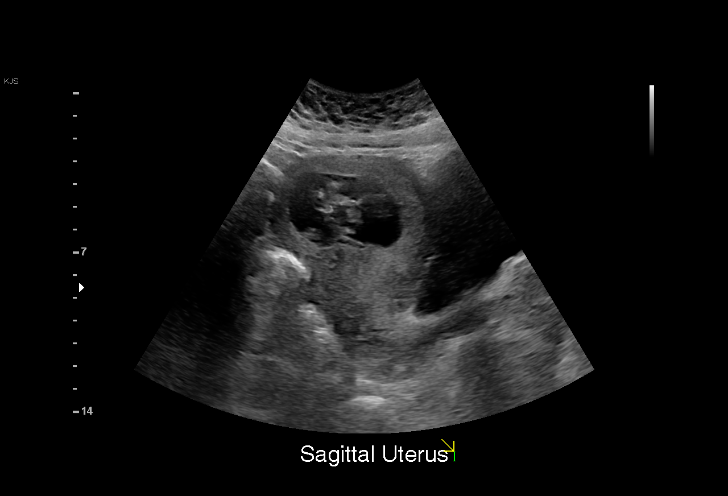
[im 4/50]
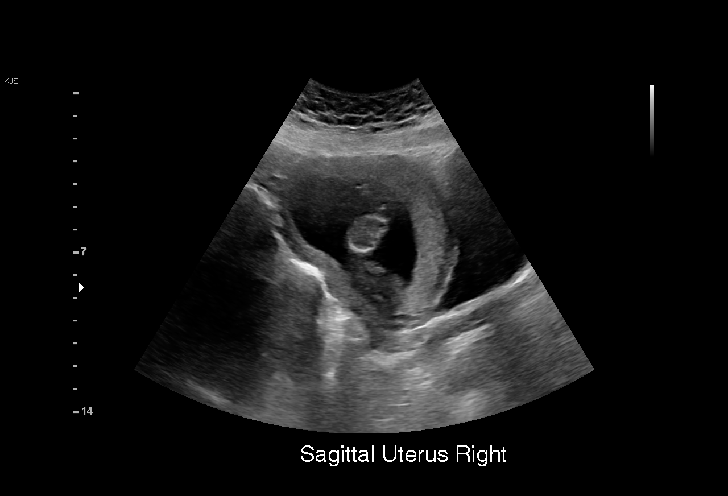
[im 8/50]
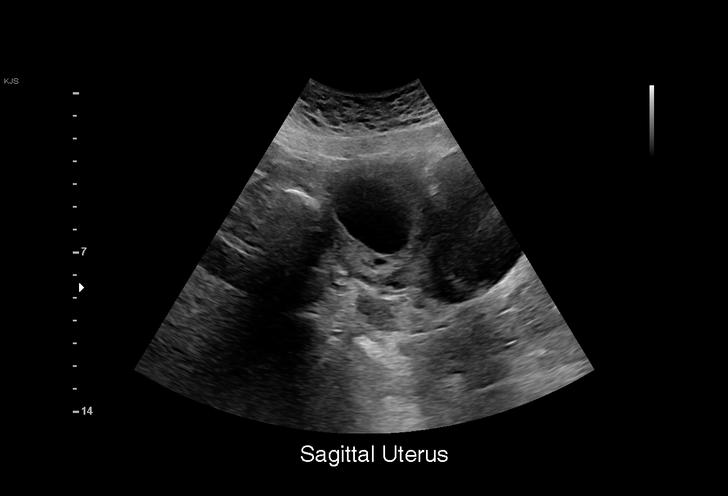
[im 11/50]
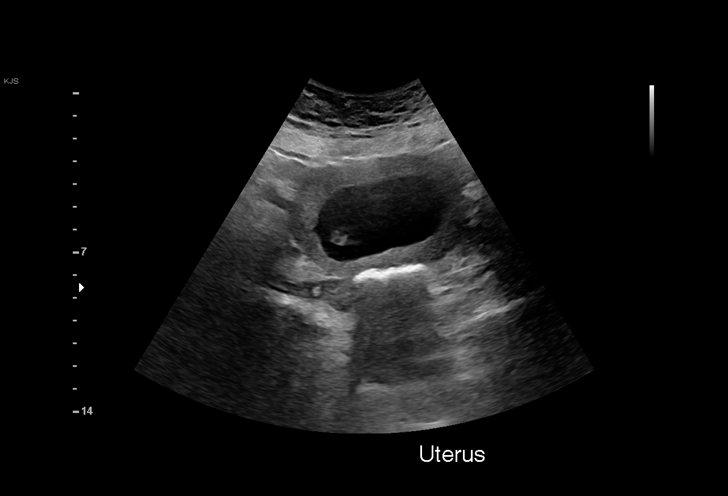
[im 15/50]
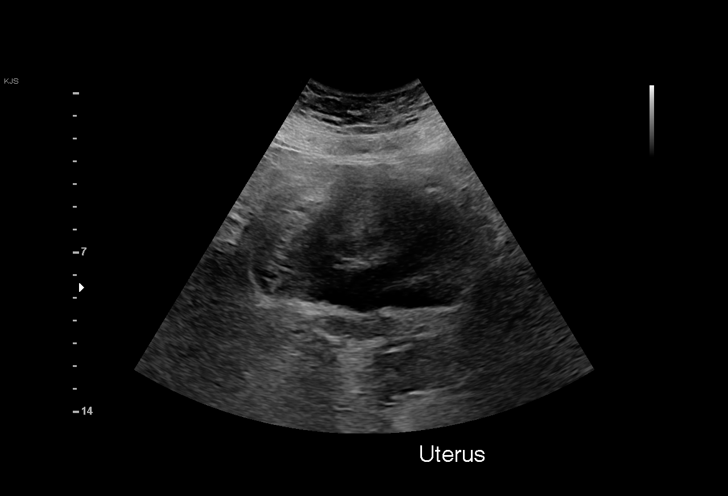
[im 19/50]
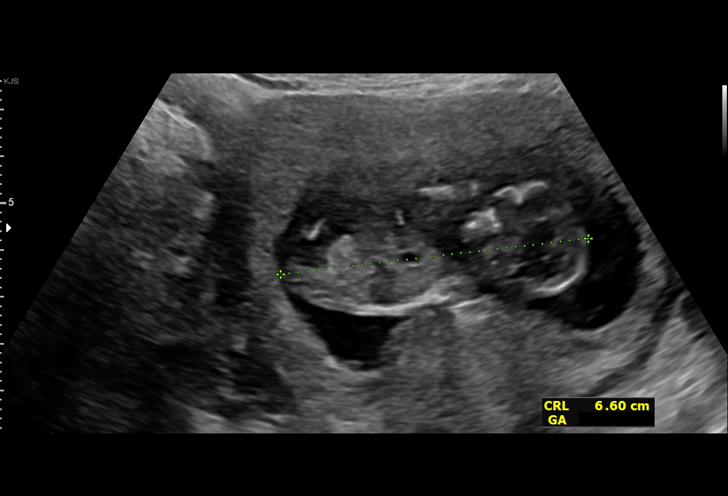
[im 22/50]
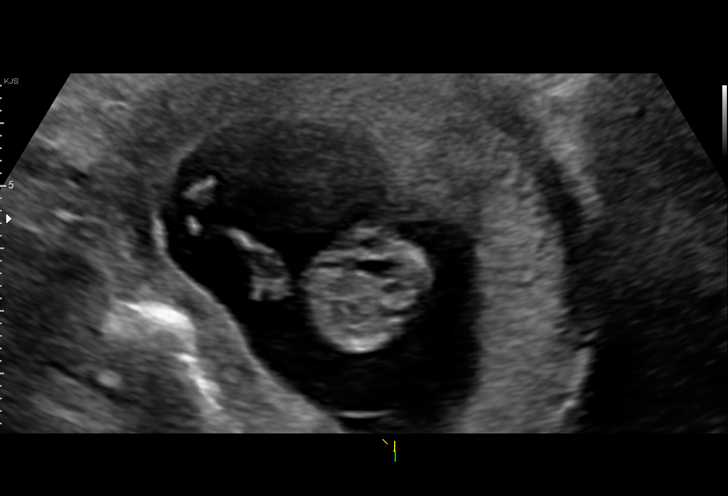
[im 26/50]
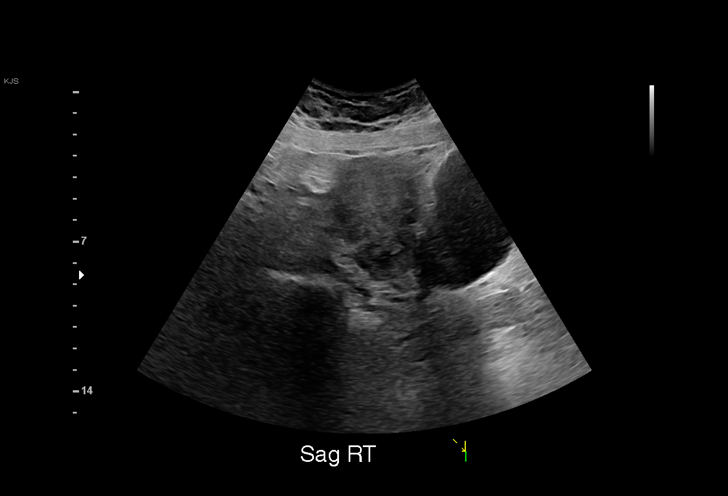
[im 28/50]
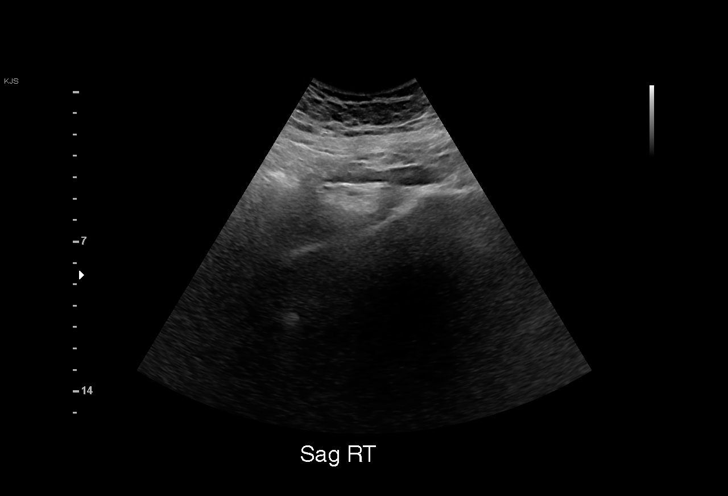
[im 31/50]
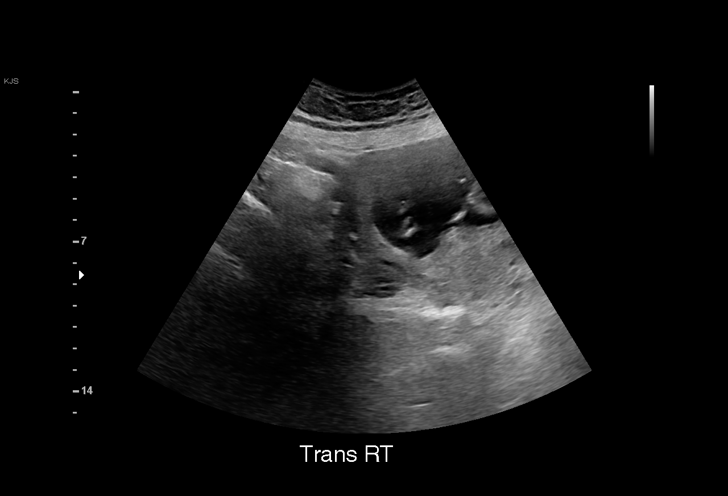
[im 35/50]
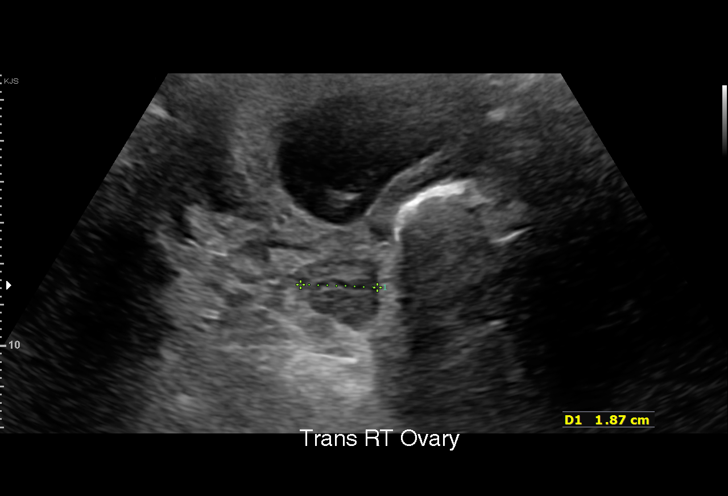
[im 39/50]
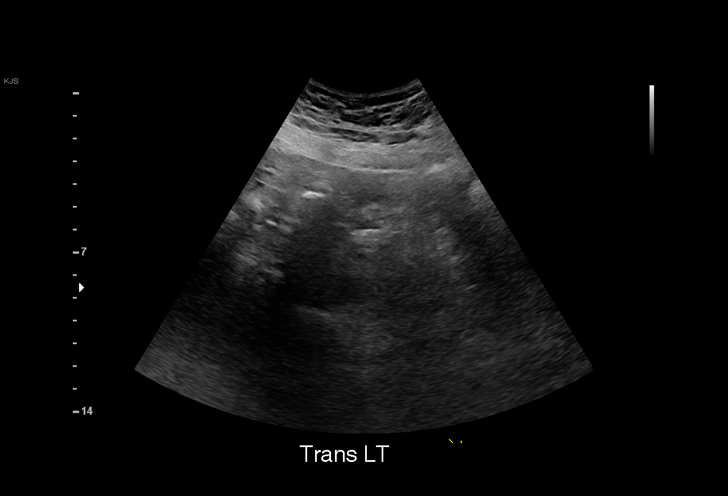
[im 42/50]
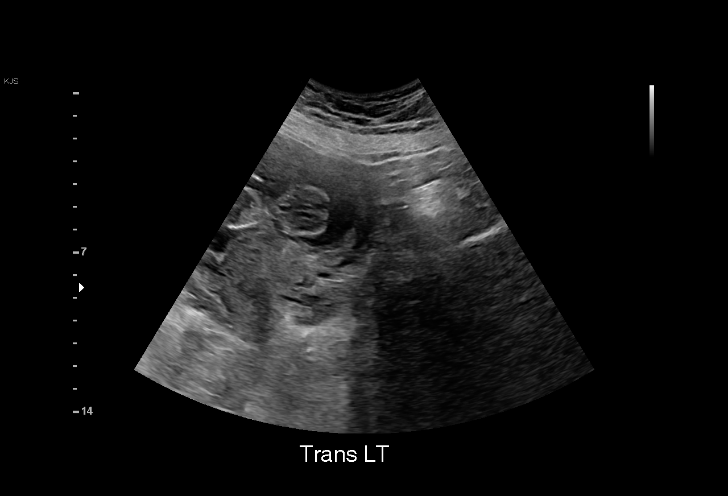
[im 46/50]
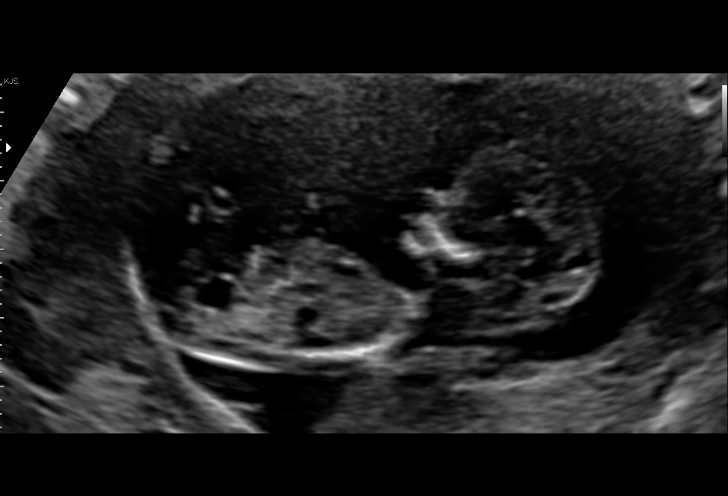
[im 50/50]
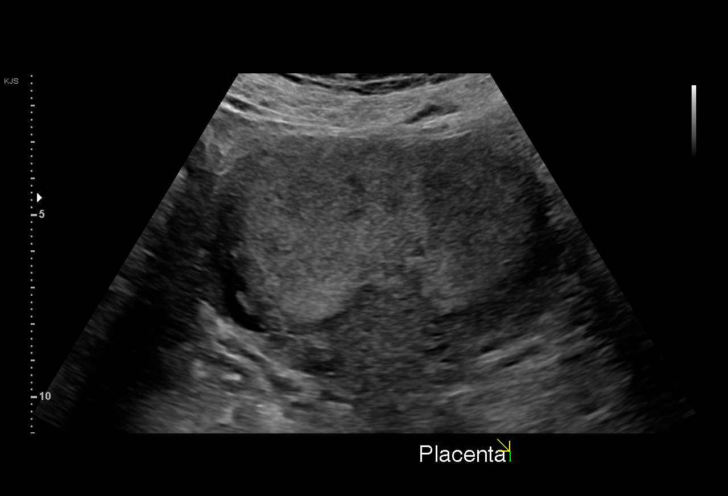

[15 of 28 positions shown; findings below may reference images not displayed]

FINDINGS: Intrauterine gestational sac: Single

Yolk sac:  Not Visualized.

Embryo:  Visualized.

Cardiac Activity: Visualized.

Heart Rate: 153 bpm

CRL:   66 mm   12 w 6 d                  US EDC: 02/09/2022

Subchorionic hemorrhage:  None visualized.

Maternal uterus/adnexae: Both ovaries are normal in appearance. No
mass or abnormal free fluid identified.
IMPRESSION: Single living IUP with estimated gestational age of 12 weeks 6 days,
and US EDC of 02/09/2022.

No maternal uterine or adnexal abnormality identified.

## 2023-05-06 ENCOUNTER — Other Ambulatory Visit: Payer: Self-pay

## 2023-05-09 ENCOUNTER — Telehealth: Admitting: Cardiology

## 2023-05-09 ENCOUNTER — Encounter: Payer: Self-pay | Admitting: Cardiology

## 2023-05-09 VITALS — BP 128/74 | HR 89 | Ht 63.0 in | Wt 197.0 lb

## 2023-05-09 DIAGNOSIS — I493 Ventricular premature depolarization: Secondary | ICD-10-CM | POA: Diagnosis not present

## 2023-05-09 NOTE — Patient Instructions (Signed)
Medication Instructions:  NO CHANGES  *If you need a refill on your cardiac medications before your next appointment, please call your pharmacy*  Follow-Up: At [facility], you and your health needs are our priority.  As part of our continuing mission to provide you with exceptional heart care, we have created designated Provider Care Teams.  These Care Teams include your primary Cardiologist (physician) and Advanced Practice Providers (APPs -  Physician Assistants and Nurse Practitioners) who all work together to provide you with the care you need, when you need it.  We recommend signing up for the patient portal called "MyChart".  Sign up information is provided on this After Visit Summary.  MyChart is used to connect with patients for Virtual Visits (Telemedicine).  Patients are able to view lab/test results, encounter notes, upcoming appointments, etc.  Non-urgent messages can be sent to your provider as well.   To learn more about what you can do with MyChart, go to [email or web address].    Your next appointment:   AS NEEDED with Dr. Servando Salina

## 2023-05-11 NOTE — Progress Notes (Signed)
Cardio-Obstetrics Clinic  New Evaluation  Date:  05/11/2023   ID:  April Benson, DOB [birth year], MRN [id number]  PCP:  Abner Greenspan, MD   [facility] Providers Cardiologist:  Thomasene Ripple, DO  Electrophysiologist:  None       Referring MD: Abner Greenspan, MD   Chief Complaint: " I am ok"  She is at home. I am in the office.  Virtual Visit via Video  Note . I connected with the patient today by a   video enabled telemedicine application and verified that I am speaking with the correct person using two identifiers.  History of Present Illness:    April Benson is a 27 y.o. female [G2P2002] who is being seen today for the evaluation symptomatic premature ventricular complexes.  Medical hx of hypertension, Hyperthyroidism, and premature ventricular complexes.   She reports no new symptoms or changes in her condition. She is currently asymptomatic, with no reports of lightheadedness or dizziness. 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  She is postpartum.  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   Management of Thyroid Storm in Pregnancy (From ACOG Bulletin June 2020)      Inihibit Thyroid release of T3/T4:      PTU 1000mg  load, then 200mg  PO q6hrs    Iodine administration 1-2hrs following PTU by ONE of the following:      Sodium iodide 500-1000mg  IV q8hr OR    Potassium iodide, 5 drops PO q8hr OR    Lugols solution, 10 drops PO q8hr OR    Lithium carbonate (if iodine anaphylaxis), 300mg  q6    Past Surgical History:  Procedure Laterality Date   NO PAST SURGERIES        OB History     Gravida  2   Para  2   Term  2   Preterm      AB      Living  2      SAB      IAB      Ectopic      Multiple   0   Live Births  2               Current Medications: Current Meds  Medication Sig   Drospirenone (SLYND) 4 MG TABS Take 1 tablet (4 mg total) by mouth daily.   ferrous sulfate 325 (65 FE) MG tablet Take 1 tablet (325 mg total) by mouth every other day.   Prenatal Vit-Fe Fumarate-FA (PRENATAL MULTIVITAMIN) TABS tablet Take 1 tablet by mouth daily at 12 noon.   propranolol (INDERAL) 60 MG tablet Take 1 tablet (60 mg total) by mouth 3 (three) times daily.   Current Facility-Administered Medications for the 05/09/23 encounter (Video Visit) with Khyson Sebesta, Lavona Mound, DO  Medication   promethazine (PHENERGAN) injection 12.5 mg     Allergies:   Patient has no known allergies.   Social History   Socioeconomic History   Marital status: Married    Spouse name: Not on file   Number of children: Not on file   Years of education: Not on file   Highest education level: Not on file  Occupational History   Not on file  Tobacco Use  Smoking status: Never   Smokeless tobacco: Never  Vaping Use   Vaping status: Never Used  Substance and Sexual Activity   Alcohol use: Not Currently   Drug use: Never   Sexual activity: Not Currently    Partners: Male  Other Topics Concern   Not on file  Social History Narrative   ** Merged History Encounter **       Social Determinants of Health   Financial Resource Strain: Low Risk  (08/21/2022)   Received from [facility], [facility]   Overall Financial Resource Strain (CARDIA)    Difficulty of Paying Living Expenses: Not very hard  Food Insecurity: No Food Insecurity (02/14/2023)   Hunger Vital Sign    Worried About Running Out of Food in the Last Year: Never true    Ran Out of Food in the Last Year: Never true  Transportation Needs: No Transportation Needs (02/14/2023)   PRAPARE - Administrator, Civil Service (Medical): No    Lack of Transportation (Non-Medical): No  Physical Activity: Sufficiently Active (05/27/2022)    Received from [facility], [facility]   Exercise Vital Sign    Days of Exercise per Week: 5 days    Minutes of Exercise per Session: 30 min  Stress: No Stress Concern Present (08/21/2022)   Received from [facility], [facility] of Occupational [facility] - Occupational Stress Questionnaire    Feeling of Stress : Not at all  Social Connections: Socially Integrated (08/21/2022)   Received from [facility], [facility]   Social Network    How would you rate your social network (family, work, friends)?: Good participation with social networks      Family History  Problem Relation Age of Onset   Hypertension Mother    Diabetes Mother    Hypothyroidism Mother    Luiz Blare' disease Mother       ROS:   Please see the history of present illness.    Palpitations All other systems reviewed and are negative.   Labs/EKG Reviewed:    EKG:   EKG is was not ordered today.     Recent Labs: 09/27/2022: ALT 17 02/01/2023: BUN 7; Creatinine, Ser 0.48; Potassium 3.4; Sodium 136; TSH <0.010 02/14/2023: Hemoglobin 10.3; Platelets 243   Recent Lipid Panel No results found for: "CHOL", "TRIG", "HDL", "CHOLHDL", "LDLCALC", "LDLDIRECT"  Physical Exam:    VS:  BP 128/74 (BP Location: Left Arm, Patient Position: Sitting, Cuff Size: Normal)   Pulse 89   Ht 5\' 3"  (1.6 m)   Wt 197 lb (89.4 kg)   BMI 34.90 kg/m     Wt Readings from Last 3 Encounters:  05/09/23 197 lb (89.4 kg)  03/17/23 204 lb (92.5 kg)  02/14/23 212 lb 12.8 oz (96.5 kg)     GEN:  Well nourished, well developed in no acute distress HEENT: Normal NECK: No JVD; No carotid bruits LYMPHATICS: No lymphadenopathy CARDIAC: RRR, no murmurs, rubs, gallops RESPIRATORY:  Clear to auscultation without rales, wheezing or rhonchi  ABDOMEN: Soft, non-tender, non-distended MUSCULOSKELETAL:  No edema; No deformity  SKIN: Warm and dry NEUROLOGIC:  Alert and oriented x 3 PSYCHIATRIC:  Normal affect    Risk  Assessment/Risk Calculators:                  ASSESSMENT & PLAN:     Premature Ventricular Contractions (PVCs) Symptomatic but not associated with lightheadedness or dizziness. -As needed follow-up based on symptomatology. -If symptoms become frequent  or associated with lightheadedness or dizziness, patient should contact the office.  Postpartum cardiovascular care No current plans for pregnancy. -If patient becomes pregnant, she should schedule a cardiology appointment.  Patient Instructions  Medication Instructions:  NO CHANGES  *If you need a refill on your cardiac medications before your next appointment, please call your pharmacy*  Follow-Up: At [facility], you and your health needs are our priority.  As part of our continuing mission to provide you with exceptional heart care, we have created designated Provider Care Teams.  These Care Teams include your primary Cardiologist (physician) and Advanced Practice Providers (APPs -  Physician Assistants and Nurse Practitioners) who all work together to provide you with the care you need, when you need it.  We recommend signing up for the patient portal called "MyChart".  Sign up information is provided on this After Visit Summary.  MyChart is used to connect with patients for Virtual Visits (Telemedicine).  Patients are able to view lab/test results, encounter notes, upcoming appointments, etc.  Non-urgent messages can be sent to your provider as well.   To learn more about what you can do with MyChart, go to [email or web address].    Your next appointment:   AS NEEDED with Dr. Servando Salina    Dispo:  No follow-ups on file.   Medication Adjustments/Labs and Tests Ordered: Current medicines are reviewed at length with the patient today.  Concerns regarding medicines are outlined above.  Tests Ordered: No orders of the defined types were placed in this encounter.  Medication Changes: No orders of the defined types  were placed in this encounter.

## 2023-06-09 IMAGING — US US MFM OB DETAIL+14 WK
1 series · 12 of 28 positions shown · non-contrast
Comparison: none

[Series 1: us mfm ob detail+14 wk · 106 acquisitions, 12 frames shown]
[im 4/106]
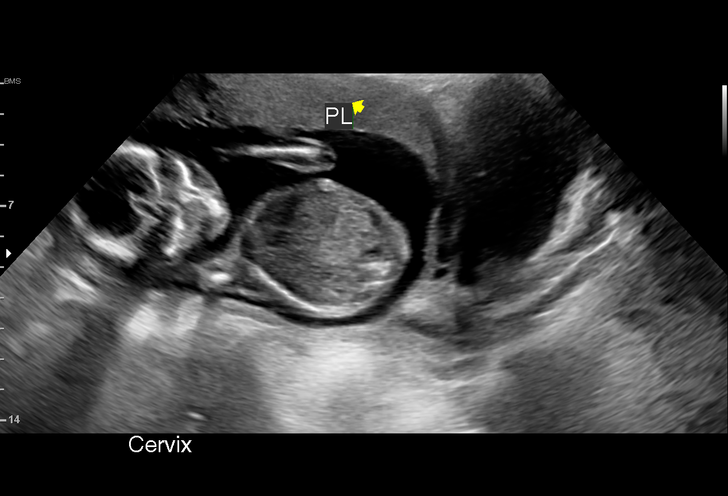
[im 12/106]
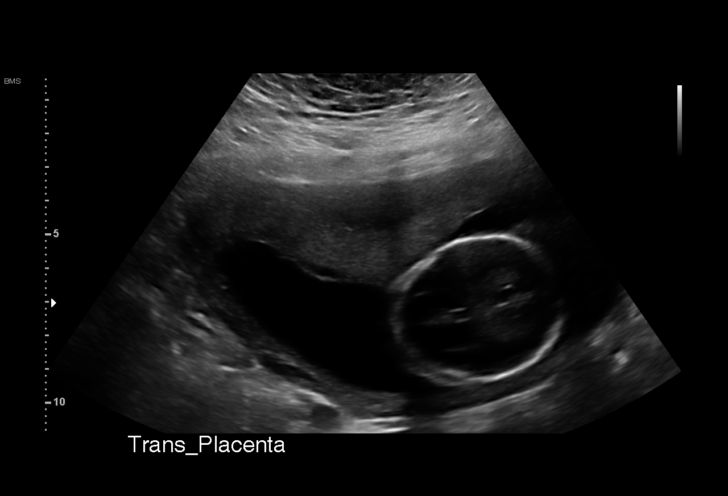
[im 20/106]
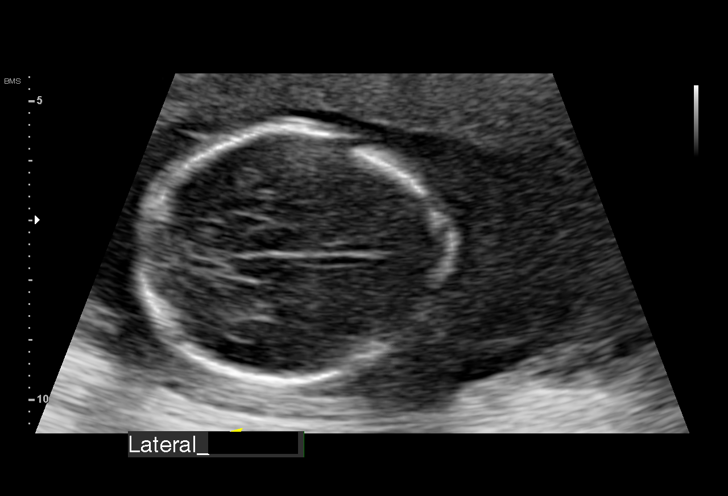
[im 32/106]
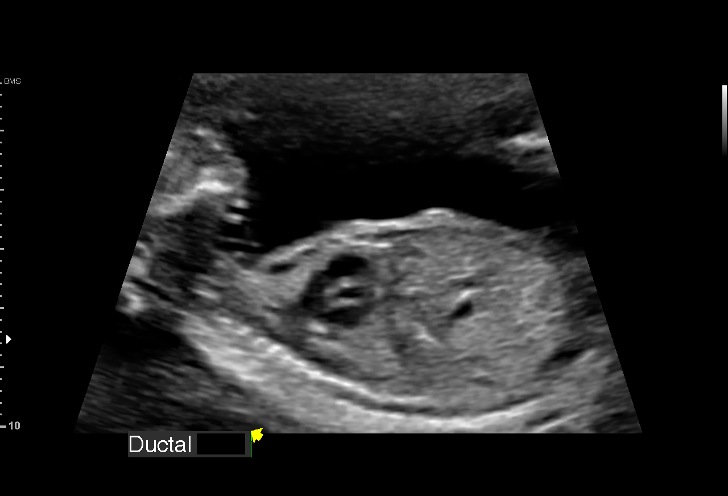
[im 39/106]
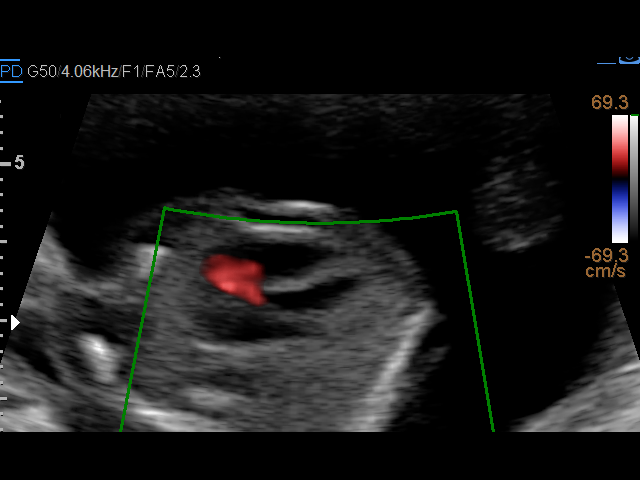
[im 47/106]
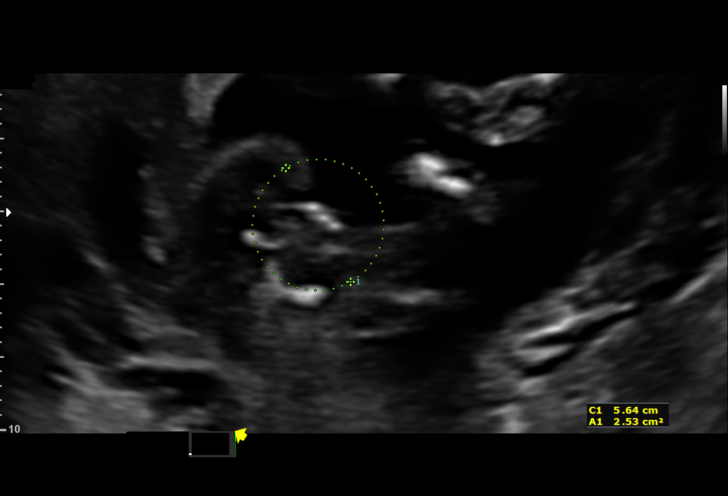
[im 59/106]
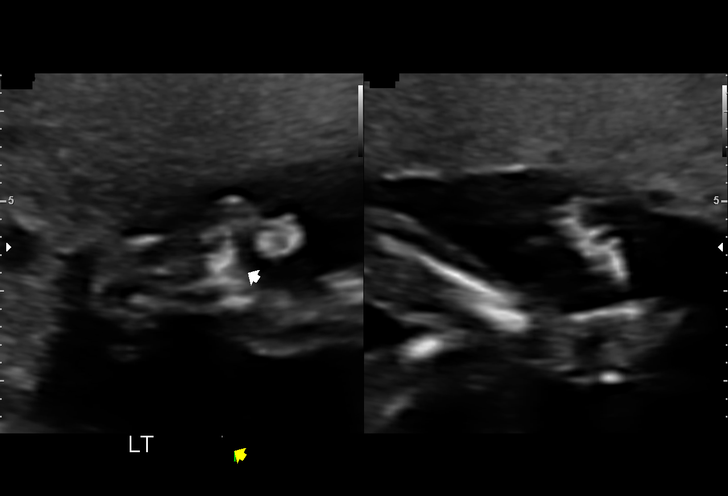
[im 67/106]
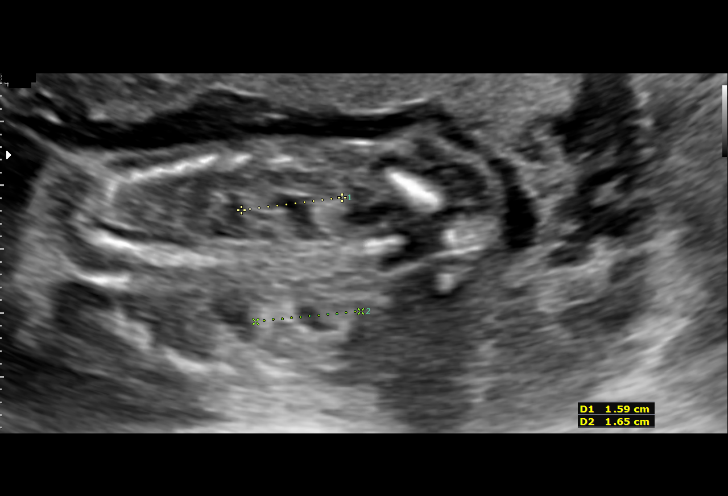
[im 74/106]
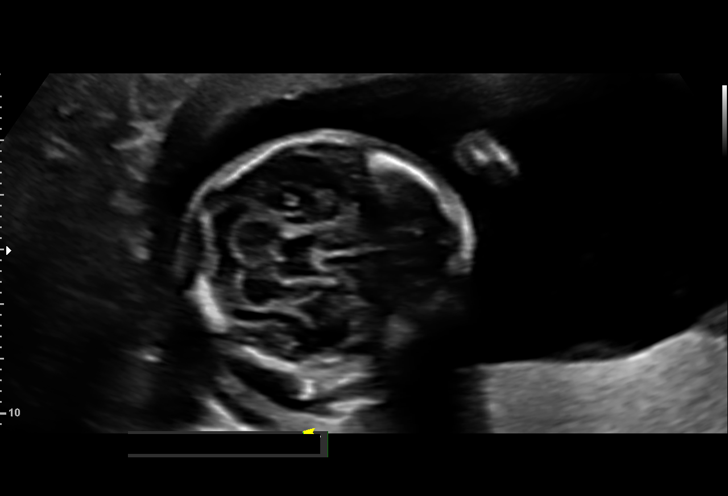
[im 86/106]
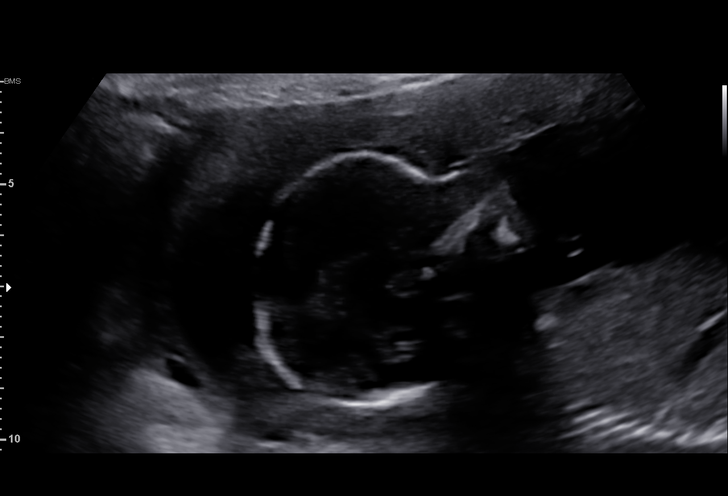
[im 94/106]
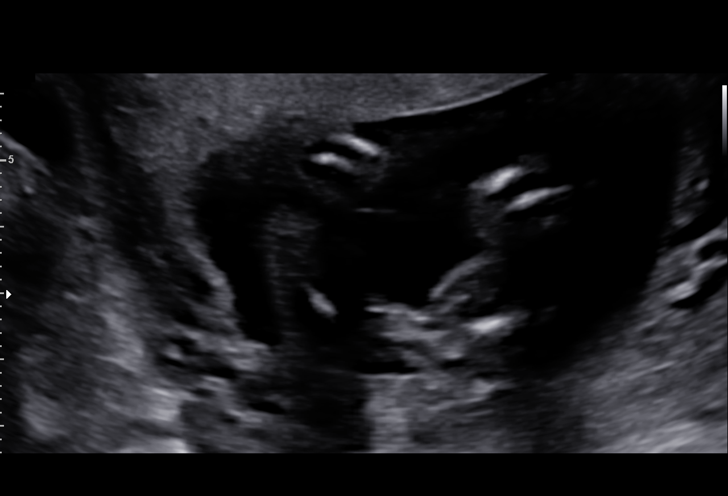
[im 102/106]
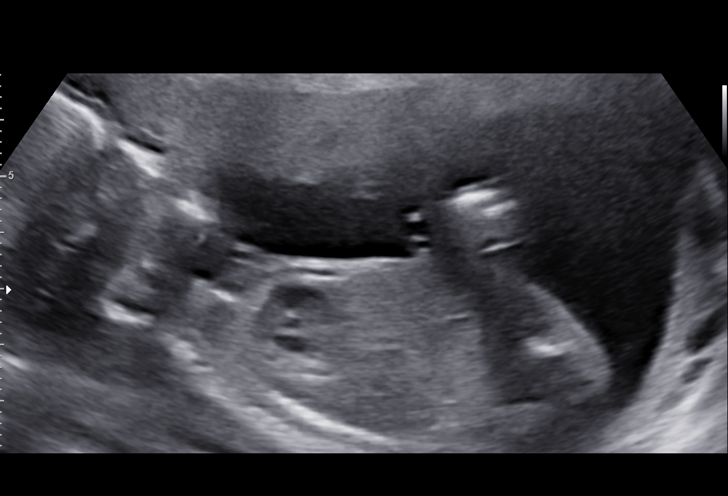

[12 of 28 positions shown; findings below may reference images not displayed]

DAPAR

Indications

 Obesity complicating pregnancy, second
 trimester (BMI 38)
 Hyperthyroid
 Hypertension - Chronic/Pre-existing (no
 meds)
 Encounter for antenatal screening for
 malformations
 18 weeks gestation of pregnancy
 Hyperemesis gravidarum
 LR NIPS (per patient)
Fetal Evaluation

 Num Of Fetuses:         1
 Fetal Heart Rate(bpm):  129
 Cardiac Activity:       Observed
 Presentation:           Breech
 Placenta:               Anterior
 P. Cord Insertion:      Visualized, central

 Amniotic Fluid
 AFI FV:      Within normal limits

                             Largest Pocket(cm)

Biometry

 BPD:      43.3  mm     G. Age:  19w 1d         68  %    CI:        77.94   %    70 - 86
                                                         FL/HC:      16.4   %    16.1 -
 HC:      155.2  mm     G. Age:  18w 3d         30  %    HC/AC:      1.15        1.09 -
 AC:      135.5  mm     G. Age:  19w 0d         56  %    FL/BPD:     58.9   %
 FL:       25.5  mm     G. Age:  17w 5d         13  %    FL/AC:      18.8   %    20 - 24
 HUM:        25  mm     G. Age:  17w 6d         26  %
 CER:      18.6  mm     G. Age:  18w 2d         22  %
 NFT:       3.8  mm

 LV:        4.4  mm
 CM:        3.8  mm

 Est. FW:     240  gm      0 lb 8 oz     30  %
OB History

 Gravidity:    1
 Living:       0
Gestational Age

 LMP:           18w 5d        Date:  05/04/21                 EDD:   02/08/22
 U/S Today:     18w 4d                                        EDD:   02/09/22
 Best:          18w 5d     Det. By:  LMP  (05/04/21)          EDD:   02/08/22
Anatomy

 Cranium:               Appears normal         Aortic Arch:            Appears normal
 Cavum:                 Appears normal         Ductal Arch:            Appears normal
 Ventricles:            Appears normal         Diaphragm:              Appears normal
 Choroid Plexus:        Appears normal         Stomach:                Appears normal, left
                                                                       sided
 Cerebellum:            Appears normal         Abdomen:                Appears normal
 Posterior Fossa:       Appears normal         Abdominal Wall:         Appears nml (cord
                                                                       insert, abd wall)
 Nuchal Fold:           Appears normal         Cord Vessels:           Appears normal (3
                                                                       vessel cord)
 Face:                  Orbits nl; profile not Kidneys:                Appear normal
                        well visualized
 Lips:                  Not well visualized    Bladder:                Appears normal
 Thoracic:              Appears normal         Spine:                  Not well visualized
 Heart:                 Not well visualized    Upper Extremities:      Appears normal
 RVOT:                  Not well visualized    Lower Extremities:      Appears normal
 LVOT:                  Not well visualized

 Other:  Nasal bone visualized. Technically difficult due to maternal habitus.
         Fetus appears to be female.
Cervix Uterus Adnexa

 Cervix
 Length:           3.35  cm.
 Normal appearance by transabdominal scan.
 Uterus
 No abnormality visualized.

 Right Ovary
 Not visualized.

 Left Ovary
 Not visualized.

 Cul De Sac
 No free fluid seen.

 Adnexa
 No adnexal mass visualized.
Comments

 Sabrena Zubair was seen for a detailed fetal anatomy scan
 due to hyperthyroidism, maternal obesity with a BMI of 38,
 and chronic hypertension.  She is currently treated with
 methimazole 5 mg twice a day and Toprol for blood pressure
 control and for palpitations.
 Her thyroid function tests drawn yesterday showed increased
 free T4 and free T3 levels along with a TSH level that was
 almost undetectable (less than 0.01).  The patient reports that
 she has an appointment with her endocrinologist within the
 next week.
 She had a cell free DNA test earlier in her pregnancy which
 indicated a low risk for trisomy 21, 18, and 13. A female fetus
 is predicted.
 She was informed that the fetal growth and amniotic fluid
 level were appropriate for her gestational age.
 There were no obvious fetal anomalies noted on today's
 ultrasound exam.  However, the views of the fetal anatomy
 were limited today due to the fetal position.
 The patient was informed that anomalies may be missed due
 to technical limitations. If the fetus is in a suboptimal position
 or maternal habitus is increased, visualization of the fetus in
 the maternal uterus may be impaired.
 The following were discussed during today's consultation:
 Hyperthyroidism and pregnancy
 The implications and management of hyperthyroidism in
 pregnancy was discussed with the patient.
 She was advised that uncontrolled hyperthyroidism is
 associated with significant adverse pregnancy outcomes in
 the mother such as the development of preeclampsia and
 thyroid storm.
 In the fetus, uncontrolled hyperthyroidism may result in fetal
 tachycardia, fetal growth restriction, and stillbirths.
 The patient was advised to consult with her endocrinologist to
 determine the most optimal dosage of methimazole to
 normalize her thyroid function.  She was advised to continue
 taking methimazole as recommended by her endocrinologist
 for the duration of pregnancy.
 She should have her thyroid function tests monitored once
 every month to ensure that she is being adequately treated
 for hyperthyroidism.
 Weekly fetal testing should be started at around 32 weeks.
 The patient was advised that maintaining normal thyroid
 function throughout her pregnancy will provide her with the
 most optimal obstetrical outcomes.
 We will continue to follow her closely with monthly growth
 ultrasounds.
 A follow-up exam was scheduled in 4 weeks to assess the
 fetal growth and to reassess the fetal anatomy.
 The patient stated that all of her questions were answered
 today.
 A total of 30 minutes was spent counseling and coordinating
 the care for this patient.  Greater than 50% of the time was
 spent in direct face-to-face contact.

## 2023-07-24 NOTE — Progress Notes (Signed)
 Subjective Patient ID: April Benson is a 28 y.o. female.  Chief Complaint  Patient presents with  . URI    Patient c/o Sinus Congestion, Headache, Sneezing, Sinus Drainage, Watery Eyes, Scratchy Throat x 3 weeks    The following information was reviewed by members of the visit team:  Allergies  Meds  Problems  OB Status    There is no problem list on file for this patient.  Current Outpatient Medications  Medication Instructions  . methIMAzole  (TAPAZOLE ) 10 mg, 3 times daily  . metoprolol  tartrate (LOPRESSOR ) 25 mg, 2 times daily  . Slynd  4 mg (28) tab 1 tablet, Daily   No Known Allergies   URI  This is a new problem. Episode onset: 3 weeks ago. She does have allergies. The problem has been gradually worsening (over the past 1 week). There has been no fever. Associated symptoms include congestion, coughing, headaches, sinus pain and sneezing. Pertinent negatives include no sore throat. Associated symptoms comments: Cough is productive for yellow/green mucus. Treatments tried: Zyrtec and Flonase and OTC Sinus and Cold. The treatment provided mild relief.    Review of Systems  Constitutional:  Negative for fever.  HENT:  Positive for congestion, postnasal drip, sinus pressure, sinus pain and sneezing. Negative for sore throat and trouble swallowing.        Scratchy throat  Eyes:  Negative for redness and itching.       Watery eyes  Respiratory:  Positive for cough.   Cardiovascular:  Negative for palpitations.  Neurological:  Positive for headaches. Negative for dizziness.  All other systems reviewed and are negative.   Objective Blood pressure 118/70, pulse (!) 114, temperature 97.1 F (36.2 C), resp. rate 20, height 1.6 m (5' 3), weight 88.5 kg (195 lb), last menstrual period 07/09/2023, SpO2 98%.  Physical Exam Constitutional:      Appearance: Normal appearance. She is not ill-appearing.  HENT:     Right Ear: Tympanic membrane and ear canal normal.     Left Ear:  Tympanic membrane and ear canal normal.     Nose: Mucosal edema present.     Right Turbinates: Swollen.     Left Turbinates: Swollen.     Right Sinus: Frontal sinus tenderness present.     Left Sinus: Frontal sinus tenderness present.     Mouth/Throat:     Mouth: Mucous membranes are moist.     Pharynx: Oropharynx is clear. Uvula midline. No pharyngeal swelling, oropharyngeal exudate, posterior oropharyngeal erythema, uvula swelling or postnasal drip.  Cardiovascular:     Rate and Rhythm: Regular rhythm. Tachycardia present.  Pulmonary:     Effort: Pulmonary effort is normal.     Breath sounds: Normal breath sounds.  Musculoskeletal:     Cervical back: Neck supple.  Lymphadenopathy:     Cervical: No cervical adenopathy.  Skin:    General: Skin is warm and dry.  Neurological:     Mental Status: She is alert and oriented to person, place, and time.     Assessment/Plan Diagnoses and all orders for this visit:  Acute frontal sinusitis, recurrence not specified -     amoxicillin-pot clavulanate (AUGMENTIN) 875-125 mg per tablet; Take 1 tablet by mouth 2 (two) times a day for 10 days. -     predniSONE (DELTASONE) 5 mg tablet; Take 1 tablet (5 mg total) by mouth daily for 10 days.  Other orders -     Slynd  4 mg (28) tab; Take 1 tablet by mouth daily. -  methIMAzole  (TAPAZOLE ) 10 mg tablet; Take 10 mg by mouth 3 (three) times a day. -     metoprolol  tartrate (LOPRESSOR ) 25 mg tablet; Take 25 mg by mouth 2 (two) times a day. -     fluconazole (DIFLUCAN) 150 mg tablet; Take 1 tablet (150 mg total) by mouth once for 1 dose.  Acute sinusitis She is to stop use of the Zyrtec Continue use of the Flonase Rx for Prednisone was given to help the sinus inflammation and Augmentin for 10 days RTC as needed  Electronically signed: Inocente Fairly, PA-C 07/24/2023  3:32 PM

## 2023-10-08 IMAGING — US US MFM OB FOLLOW-UP
1 series · 13 of 28 positions shown · non-contrast
Comparison: none

[Series 1: us mfm ob follow-up · 47 acquisitions, 13 frames shown]
[im 2/47]
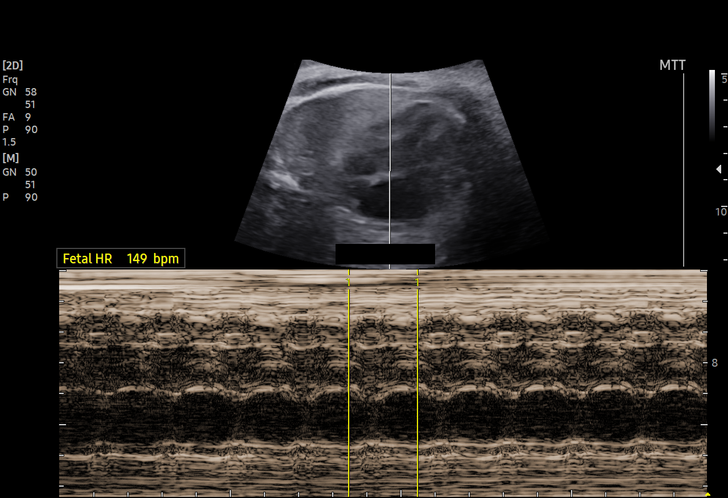
[im 6/47]
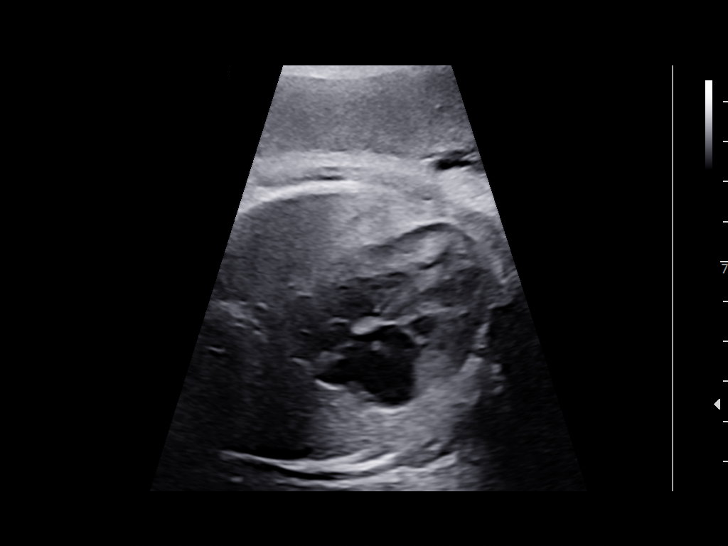
[im 9/47]
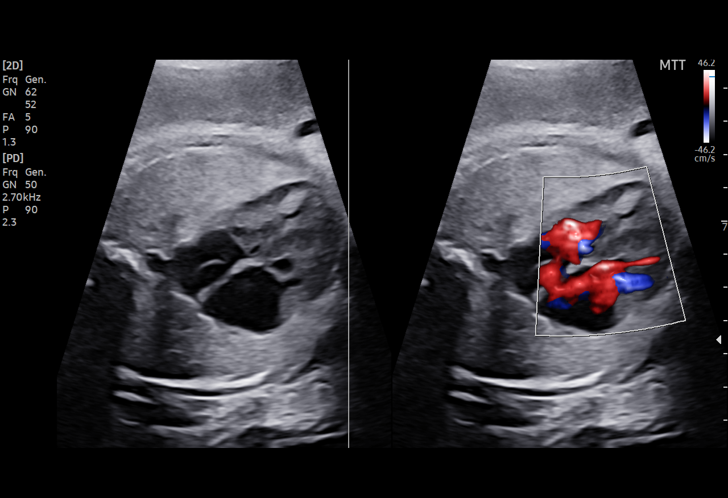
[im 12/47]
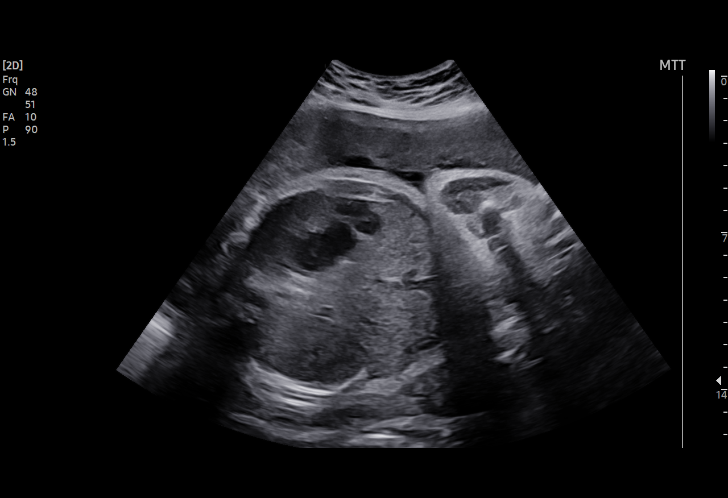
[im 16/47]
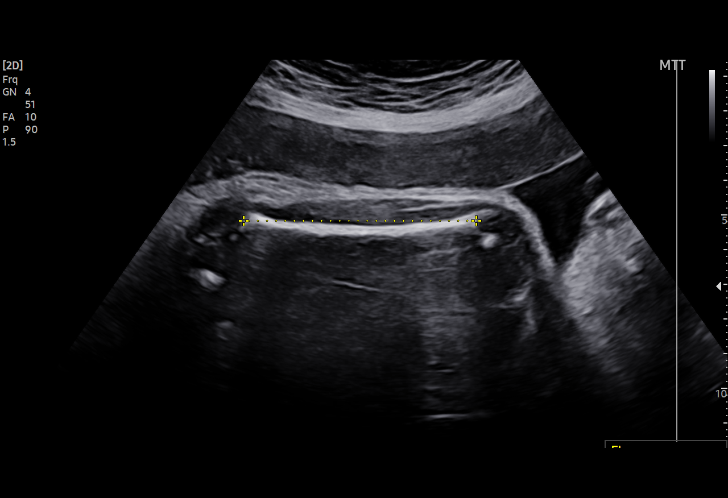
[im 19/47]
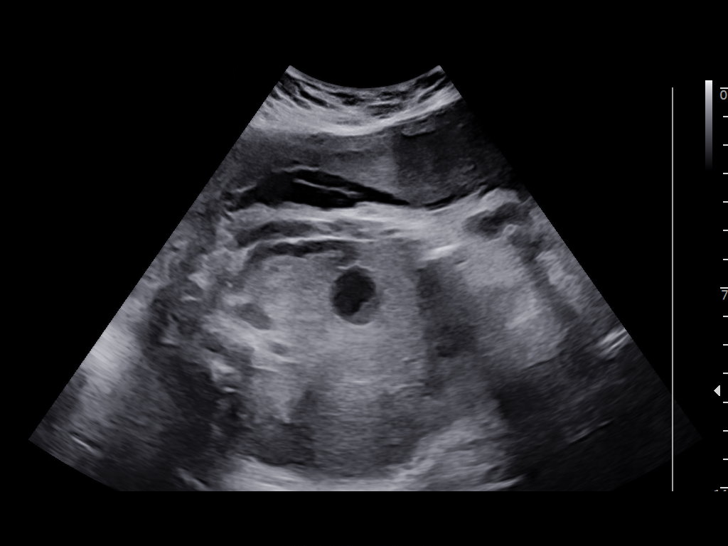
[im 24/47]
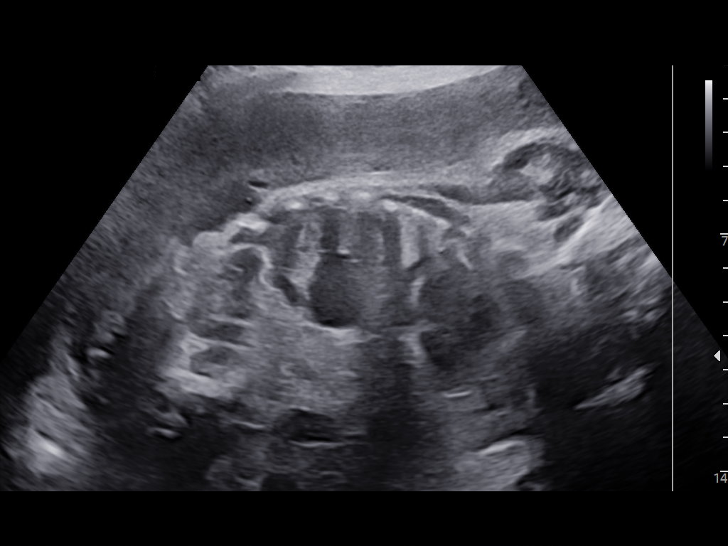
[im 28/47]
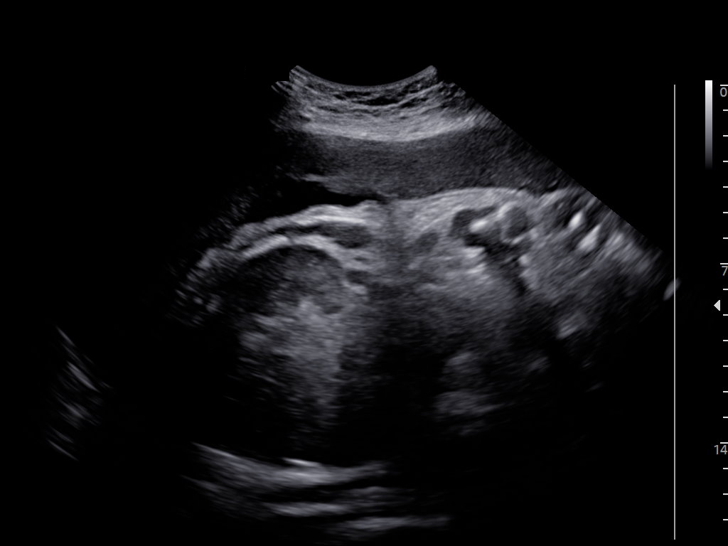
[im 31/47]
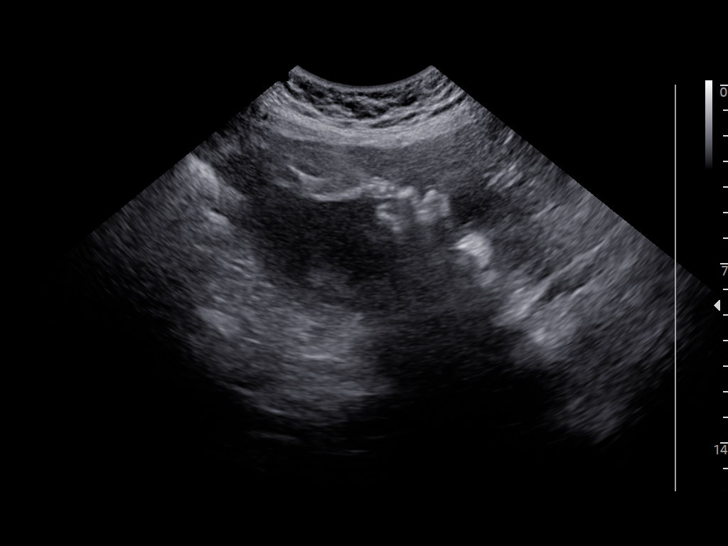
[im 35/47]
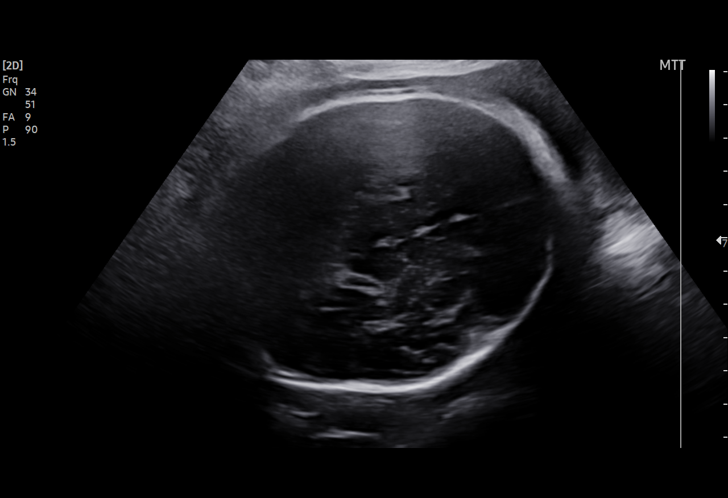
[im 38/47]
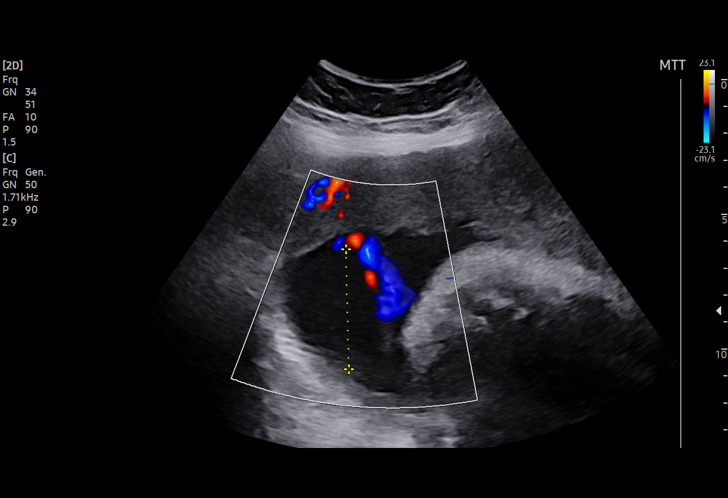
[im 41/47]
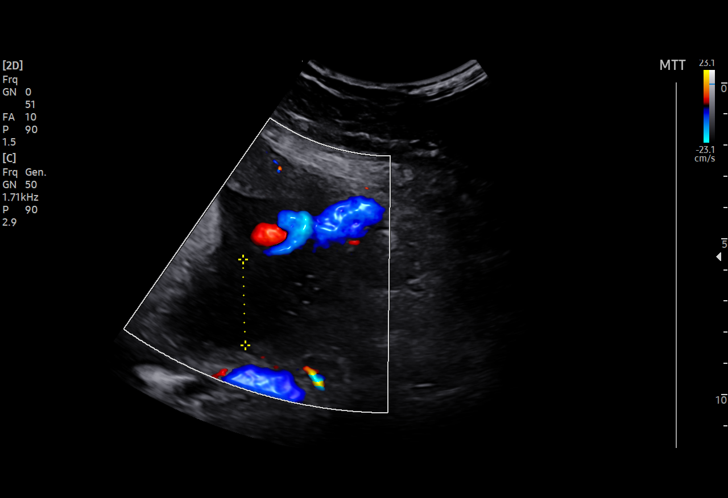
[im 45/47]
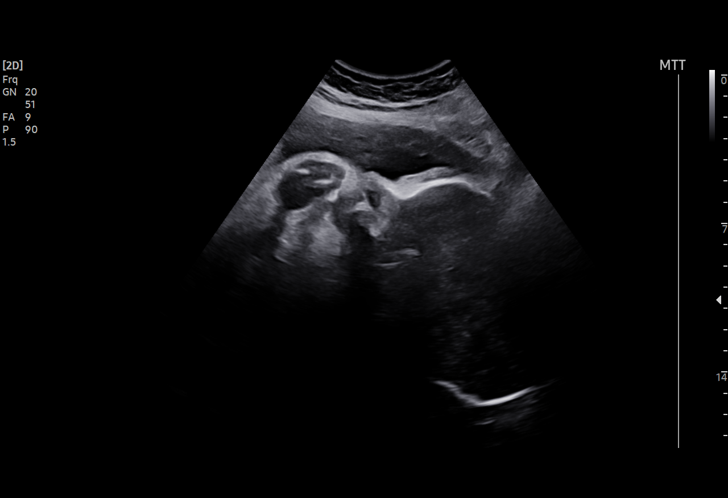

[13 of 28 positions shown; findings below may reference images not displayed]

DEVIKA CNM                               [HOSPITAL] at
                   [id number]

Indications

 36 weeks gestation of pregnancy
 Hyperthyroid (Methimazole and metoprolol)
 Obesity complicating pregnancy, third
 trimester
 Hypertension - Chronic/Pre-existing (no
 meds)
 Hyperemesis gravidarum
 LR NIPS (per patient)
 Encounter for other antenatal screening
 follow-up
Fetal Evaluation

 Num Of Fetuses:         1
 Fetal Heart Rate(bpm):  149
 Cardiac Activity:       Observed
 Presentation:           Cephalic
 Placenta:               Anterior
 P. Cord Insertion:      Previously Visualized

 Amniotic Fluid
 AFI FV:      Within normal limits

 AFI Sum(cm)     %Tile       Largest Pocket(cm)
 13.57           48
 RUQ(cm)       RLQ(cm)       LUQ(cm)        LLQ(cm)

Biophysical Evaluation

 Amniotic F.V:   Pocket => 2 cm             F. Tone:        Observed
 F. Movement:    Observed                   Score:          [DATE]
 F. Breathing:   Observed
Biometry

 BPD:     88.08  mm     G. Age:  35w 4d         47  %    CI:        77.11   %    70 - 86
                                                         FL/HC:      21.4   %    20.1 -
 HC:    317.61   mm     G. Age:  35w 5d         15  %    HC/AC:      0.88        0.93 -
 AC:    361.38   mm     G. Age:  40w 0d       > 99  %    FL/BPD:     77.1   %    71 - 87
 FL:      67.89  mm     G. Age:  34w 6d         19  %    FL/AC:      18.8   %    20 - 24

 LV:        5.4  mm

 Est. FW:    1113  gm      7 lb 6 oz     93  %
OB History

 Gravidity:    1
 Living:       0
Gestational Age

 LMP:           36w 0d        Date:  05/04/21                 EDD:   02/08/22
 U/S Today:     36w 4d                                        EDD:   02/04/22
 Best:          36w 0d     Det. By:  LMP  (05/04/21)          EDD:   02/08/22
Anatomy

 Cranium:               Appears normal         LVOT:                   Previously seen
 Cavum:                 Appears normal         Aortic Arch:            Previously seen
 Ventricles:            Appears normal         Ductal Arch:            Previously seen
 Choroid Plexus:        Previously seen        Diaphragm:              Appears normal
 Cerebellum:            Previously seen        Stomach:                Appears normal, left
                                                                       sided
 Posterior Fossa:       Previously seen        Abdomen:                Previously seen
 Nuchal Fold:           Previously seen        Abdominal Wall:         Previously seen
 Face:                  Orbits and profile     Cord Vessels:           Previously seen
                        previously seen
 Lips:                  Previously seen        Kidneys:                Appear normal
 Palate:                Not well visualized    Bladder:                Appears normal
 Thoracic:              Appears normal         Spine:                  Previously seen
 Heart:                 Appears normal         Upper Extremities:      Previously seen
                        (4CH, axis, and
                        situs)
 RVOT:                  Previously seen        Lower Extremities:      Previously seen

 Other:  Female gender previously seen. Nasal Bone, Lenses, VC, 3VV and
         3VTV previously visualized. Technically difficult due to advanced
         gestational age.
Cervix Uterus Adnexa
 Cervix
 Not visualized (advanced GA >85wks)

 Uterus
 No abnormality visualized.

 Right Ovary
 Within normal limits.

 Left Ovary
 Within normal limits.

 Cul De Sac
 No free fluid seen.

 Adnexa
 No abnormality visualized.
Impression

 Hyperthyroidism.  Patient takes methimazole and will be
 discontinuing the medication from today.
 Chronic hypertension.  Well-controlled without
 antihypertensives.  Blood pressure today at her office is
 126/76 mmHg.
 The estimated fetal weight is at the 93rd percentile and the
 abdominal circumference measurement is at the 99
 percentile.  Amniotic fluid is normal and good fetal activity
 seen.  Antenatal testing is reassuring.  BPP [DATE].  Cephalic
 presentation.
 I explained that ultrasound has limitations in accurately
 estimating fetal weights.
Recommendations

 -Continue weekly BPP till delivery.
                Tuazon, Cecila

## 2024-07-03 ENCOUNTER — Other Ambulatory Visit (HOSPITAL_BASED_OUTPATIENT_CLINIC_OR_DEPARTMENT_OTHER): Payer: Self-pay | Admitting: Family Medicine

## 2024-07-03 DIAGNOSIS — E041 Nontoxic single thyroid nodule: Secondary | ICD-10-CM

## 2024-07-09 ENCOUNTER — Ambulatory Visit (INDEPENDENT_AMBULATORY_CARE_PROVIDER_SITE_OTHER)
Admission: RE | Admit: 2024-07-09 | Discharge: 2024-07-09 | Disposition: A | Discharge status: Home / Self Care | Source: Ambulatory Visit | Attending: Family Medicine | Admitting: Family Medicine

## 2024-07-09 DIAGNOSIS — E041 Nontoxic single thyroid nodule: Secondary | ICD-10-CM | POA: Diagnosis not present

## 2024-07-21 ENCOUNTER — Ambulatory Visit: Admitting: Obstetrics and Gynecology

## 2024-07-21 ENCOUNTER — Encounter: Payer: Self-pay | Admitting: Obstetrics and Gynecology

## 2024-07-21 VITALS — BP 137/88 | HR 105 | Ht 63.0 in | Wt 187.0 lb

## 2024-07-21 DIAGNOSIS — Z01419 Encounter for gynecological examination (general) (routine) without abnormal findings: Secondary | ICD-10-CM

## 2024-07-21 MED ORDER — FLUCONAZOLE 150 MG PO TABS: 150.0000 mg | ORAL_TABLET | Freq: Once | ORAL | 1 refills | Status: AC

## 2024-07-21 MED ORDER — SLYND 4 MG PO TABS: 1.0000 | ORAL_TABLET | Freq: Every day | ORAL | 3 refills | Status: AC

## 2024-07-21 NOTE — Progress Notes (Signed)
 Subjective:     April Benson is a 28 y.o. female P2 with LMP 06/30/24 and BMI 33 who is here for a comprehensive physical exam. The patient reports taking antibiotics for sinus infection and experiencing yeast infection. Patient continues to take slynd  for contraception. She reports a monthly period lasting 5 days. She is sexually active without complaints. She denies pelvic pain. 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   Management of Thyroid  Storm in Pregnancy (From ACOG Bulletin June 2020)      Inihibit Thyroid  release of T3/T4:      PTU 1000mg  load, then 200mg  PO q6hrs    Iodine administration 1-2hrs following PTU by ONE of the following:      Sodium iodide 500-1000mg  IV q8hr OR    Potassium iodide, 5 drops PO q8hr OR    Lugols solution, 10 drops PO q8hr OR    Lithium carbonate (if iodine anaphylaxis), 300mg  q6   Past Surgical History:  Procedure Laterality Date   NO PAST SURGERIES     Family History  Problem Relation Age of Onset   Hypertension Mother    Diabetes Mother    Hypothyroidism Mother    Graves' disease Mother     Social History   Socioeconomic History   Marital status: Married    Spouse name: Not on file   Number of children: Not on file   Years of education: Not on file   Highest education level: Not on file  Occupational History   Not on file  Tobacco Use   Smoking status: Never   Smokeless tobacco: Never  Vaping Use   Vaping status: Never Used  Substance and Sexual Activity   Alcohol use: Not Currently   Drug use: Never   Sexual activity: Not Currently    Partners: Male    Birth control/protection: Pill  Other Topics Concern   Not on file  Social History Narrative   ** Merged History Encounter **       Social Drivers of Health   Tobacco Use: Low Risk (07/21/2024)    Patient History    Smoking Tobacco Use: Never    Smokeless Tobacco Use: Never    Passive Exposure: Not on file  Financial Resource Strain: Low Risk (08/21/2022)   Received from [facility]   Overall Financial Resource Strain (CARDIA)    Difficulty of Paying Living Expenses: Not very hard  Food Insecurity: No Food Insecurity (02/14/2023)   Hunger Vital Sign    Worried About Running Out of Food in the Last Year: Never true    Ran Out of Food in the Last Year: Never true  Transportation Needs: No Transportation Needs (02/14/2023)   PRAPARE - Administrator, Civil Service (Medical): No    Lack of Transportation (Non-Medical): No  Physical Activity: Sufficiently Active (05/27/2022)   Received from [facility]   Exercise Vital Sign    On average, how many days per week do you engage in moderate to strenuous exercise (like a brisk walk)?: 5 days    On average, how many minutes do you engage in exercise at this level?: 30 min  Stress: No Stress Concern Present (08/21/2022)   Received from [facility] - Occupational Stress Questionnaire  Feeling of Stress : Not at all  Social Connections: Socially Integrated (08/21/2022)   Received from [facility]   Social Network    How would you rate your social network (family, work, friends)?: Good participation with social networks  Intimate Partner Violence: Not At Risk (02/15/2023)   Humiliation, Afraid, Rape, and Kick questionnaire    Fear of Current or Ex-Partner: No    Emotionally Abused: No    Physically Abused: No    Sexually Abused: No  Depression (PHQ2-9): Medium Risk (08/12/2022)   Depression (PHQ2-9)    PHQ-2 Score: 8  Alcohol Screen: Not on file  Housing: Low Risk (02/14/2023)   Housing    Last Housing Risk Score: 0  Utilities: Not At Risk (02/14/2023)   [facility] Utilities    Threatened with loss of utilities: No  Health Literacy: Not on file   Health Maintenance  Topic Date  Due   Hepatitis B Vaccines 19-59 Average Risk (1 of 3 - 19+ 3-dose series) Never done   HPV VACCINES (1 - 3-dose SCDM series) Never done   Influenza Vaccine  02/20/2024   COVID-19 Vaccine (1 - 2025-26 season) Never done   Cervical Cancer Screening (HPV/Pap Cotest)  08/12/2025   DTaP/Tdap/Td (3 - Td or Tdap) 12/04/2032   Hepatitis C Screening  Completed   HIV Screening  Completed   Pneumococcal Vaccine  Aged Out   Meningococcal B Vaccine  Aged Out       Review of Systems Pertinent items noted in HPI and remainder of comprehensive ROS otherwise negative.   Objective:  Blood pressure 137/88, pulse (!) 105, height 5' 3 (1.6 m), weight 187 lb (84.8 kg), last menstrual period 06/30/2024, not currently breastfeeding.   GENERAL: Well-developed, well-nourished female in no acute distress.  HEENT: Normocephalic, atraumatic. Sclerae anicteric.  NECK: Supple. Normal thyroid .  LUNGS: Clear to auscultation bilaterally.  HEART: Regular rate and rhythm. BREASTS: Symmetric in size. No palpable masses or lymphadenopathy, skin changes, or nipple drainage. ABDOMEN: Soft, nontender, nondistended. No organomegaly. PELVIC: Normal external female genitalia. Vagina is pink and rugated.  Normal discharge. Uterus is normal in size. No adnexal mass or tenderness. Chaperone present during the pelvic exam EXTREMITIES: No cyanosis, clubbing, or edema, 2+ distal pulses.     Assessment:    Healthy female exam.      Plan:    Patient with normal pap smear 07/2022 Refill on Slynd  provided. Husband is awaiting [facility] with [facility] diflucan provided as patient is taking antibiotics for yeast infection RTC prn See After Visit Summary for Counseling Recommendations
